# Patient Record
Sex: Female | Born: 1960 | Race: White | Hispanic: No | Smoking: Never smoker
Health system: Southern US, Community
[De-identification: ages and names within clinical notes are randomized; demographics above are authoritative.]

## PROBLEM LIST (undated history)

## (undated) DIAGNOSIS — M51369 Other intervertebral disc degeneration, lumbar region without mention of lumbar back pain or lower extremity pain: Secondary | ICD-10-CM

## (undated) DIAGNOSIS — A419 Sepsis, unspecified organism: Secondary | ICD-10-CM

## (undated) DIAGNOSIS — R109 Unspecified abdominal pain: Secondary | ICD-10-CM

## (undated) DIAGNOSIS — J449 Chronic obstructive pulmonary disease, unspecified: Secondary | ICD-10-CM

## (undated) DIAGNOSIS — K219 Gastro-esophageal reflux disease without esophagitis: Secondary | ICD-10-CM

## (undated) DIAGNOSIS — E119 Type 2 diabetes mellitus without complications: Secondary | ICD-10-CM

## (undated) DIAGNOSIS — K5792 Diverticulitis of intestine, part unspecified, without perforation or abscess without bleeding: Secondary | ICD-10-CM

## (undated) DIAGNOSIS — K859 Acute pancreatitis without necrosis or infection, unspecified: Secondary | ICD-10-CM

## (undated) DIAGNOSIS — M1990 Unspecified osteoarthritis, unspecified site: Secondary | ICD-10-CM

## (undated) DIAGNOSIS — J189 Pneumonia, unspecified organism: Secondary | ICD-10-CM

## (undated) DIAGNOSIS — K5790 Diverticulosis of intestine, part unspecified, without perforation or abscess without bleeding: Secondary | ICD-10-CM

## (undated) DIAGNOSIS — M5136 Other intervertebral disc degeneration, lumbar region: Secondary | ICD-10-CM

## (undated) DIAGNOSIS — D869 Sarcoidosis, unspecified: Secondary | ICD-10-CM

## (undated) DIAGNOSIS — M797 Fibromyalgia: Secondary | ICD-10-CM

## (undated) DIAGNOSIS — I341 Nonrheumatic mitral (valve) prolapse: Secondary | ICD-10-CM

## (undated) HISTORY — DX: Other intervertebral disc degeneration, lumbar region: M51.36

## (undated) HISTORY — DX: Sarcoidosis, unspecified: D86.9

## (undated) HISTORY — DX: Other intervertebral disc degeneration, lumbar region without mention of lumbar back pain or lower extremity pain: M51.369

## (undated) HISTORY — PX: CHOLECYSTECTOMY: SHX55

## (undated) HISTORY — PX: ABDOMINAL HYSTERECTOMY: SHX81

---

## 1995-09-16 DIAGNOSIS — K859 Acute pancreatitis without necrosis or infection, unspecified: Secondary | ICD-10-CM

## 1995-09-16 HISTORY — DX: Acute pancreatitis without necrosis or infection, unspecified: K85.90

## 1998-06-10 ENCOUNTER — Emergency Department (HOSPITAL_COMMUNITY): Admission: EM | Admit: 1998-06-10 | Discharge: 1998-06-11 | Payer: Self-pay | Admitting: Emergency Medicine

## 1998-06-11 ENCOUNTER — Encounter: Payer: Self-pay | Admitting: Emergency Medicine

## 2000-05-12 ENCOUNTER — Encounter: Admission: RE | Admit: 2000-05-12 | Discharge: 2000-05-12 | Payer: Self-pay | Admitting: Internal Medicine

## 2000-05-21 ENCOUNTER — Encounter: Admission: RE | Admit: 2000-05-21 | Discharge: 2000-05-21 | Payer: Self-pay | Admitting: Internal Medicine

## 2000-08-16 ENCOUNTER — Emergency Department (HOSPITAL_COMMUNITY): Admission: EM | Admit: 2000-08-16 | Discharge: 2000-08-16 | Payer: Self-pay | Admitting: Emergency Medicine

## 2000-08-27 ENCOUNTER — Ambulatory Visit (HOSPITAL_COMMUNITY): Admission: RE | Admit: 2000-08-27 | Discharge: 2000-08-27 | Payer: Self-pay | Admitting: Gastroenterology

## 2000-08-27 ENCOUNTER — Encounter: Payer: Self-pay | Admitting: Gastroenterology

## 2000-08-31 ENCOUNTER — Ambulatory Visit (HOSPITAL_COMMUNITY): Admission: RE | Admit: 2000-08-31 | Discharge: 2000-08-31 | Payer: Self-pay | Admitting: Gastroenterology

## 2000-08-31 ENCOUNTER — Encounter: Payer: Self-pay | Admitting: Gastroenterology

## 2000-09-22 ENCOUNTER — Ambulatory Visit (HOSPITAL_COMMUNITY): Admission: RE | Admit: 2000-09-22 | Discharge: 2000-09-22 | Payer: Self-pay | Admitting: Gastroenterology

## 2000-09-22 ENCOUNTER — Encounter: Payer: Self-pay | Admitting: Gastroenterology

## 2000-09-28 ENCOUNTER — Ambulatory Visit (HOSPITAL_COMMUNITY): Admission: RE | Admit: 2000-09-28 | Discharge: 2000-09-28 | Payer: Self-pay | Admitting: Gastroenterology

## 2000-09-28 ENCOUNTER — Encounter: Payer: Self-pay | Admitting: Gastroenterology

## 2009-09-19 DIAGNOSIS — K5792 Diverticulitis of intestine, part unspecified, without perforation or abscess without bleeding: Secondary | ICD-10-CM

## 2011-12-11 DIAGNOSIS — D862 Sarcoidosis of lung with sarcoidosis of lymph nodes: Secondary | ICD-10-CM | POA: Insufficient documentation

## 2011-12-11 DIAGNOSIS — R911 Solitary pulmonary nodule: Secondary | ICD-10-CM | POA: Insufficient documentation

## 2013-12-14 ENCOUNTER — Encounter: Payer: Self-pay | Admitting: Pulmonary Disease

## 2013-12-15 ENCOUNTER — Institutional Professional Consult (permissible substitution): Payer: Self-pay | Admitting: Critical Care Medicine

## 2013-12-15 ENCOUNTER — Institutional Professional Consult (permissible substitution): Payer: Medicare Other | Admitting: Critical Care Medicine

## 2014-02-09 ENCOUNTER — Institutional Professional Consult (permissible substitution): Payer: Self-pay | Admitting: Critical Care Medicine

## 2014-03-05 ENCOUNTER — Emergency Department (HOSPITAL_BASED_OUTPATIENT_CLINIC_OR_DEPARTMENT_OTHER): Payer: Medicare Other

## 2014-03-05 ENCOUNTER — Encounter (HOSPITAL_BASED_OUTPATIENT_CLINIC_OR_DEPARTMENT_OTHER): Payer: Self-pay | Admitting: Emergency Medicine

## 2014-03-05 ENCOUNTER — Inpatient Hospital Stay (HOSPITAL_BASED_OUTPATIENT_CLINIC_OR_DEPARTMENT_OTHER)
Admission: EM | Admit: 2014-03-05 | Discharge: 2014-03-08 | DRG: 871 | Disposition: A | Discharge status: Home / Self Care | Payer: Medicare Other | Attending: Internal Medicine | Admitting: Internal Medicine

## 2014-03-05 DIAGNOSIS — Z886 Allergy status to analgesic agent status: Secondary | ICD-10-CM

## 2014-03-05 DIAGNOSIS — A419 Sepsis, unspecified organism: Secondary | ICD-10-CM | POA: Diagnosis present

## 2014-03-05 DIAGNOSIS — J42 Unspecified chronic bronchitis: Secondary | ICD-10-CM

## 2014-03-05 DIAGNOSIS — K5792 Diverticulitis of intestine, part unspecified, without perforation or abscess without bleeding: Secondary | ICD-10-CM

## 2014-03-05 DIAGNOSIS — I498 Other specified cardiac arrhythmias: Secondary | ICD-10-CM | POA: Diagnosis present

## 2014-03-05 DIAGNOSIS — Z882 Allergy status to sulfonamides status: Secondary | ICD-10-CM

## 2014-03-05 DIAGNOSIS — R5382 Chronic fatigue, unspecified: Secondary | ICD-10-CM | POA: Diagnosis present

## 2014-03-05 DIAGNOSIS — J449 Chronic obstructive pulmonary disease, unspecified: Secondary | ICD-10-CM | POA: Diagnosis present

## 2014-03-05 DIAGNOSIS — D869 Sarcoidosis, unspecified: Secondary | ICD-10-CM | POA: Diagnosis present

## 2014-03-05 DIAGNOSIS — J438 Other emphysema: Secondary | ICD-10-CM

## 2014-03-05 DIAGNOSIS — G9332 Myalgic encephalomyelitis/chronic fatigue syndrome: Secondary | ICD-10-CM | POA: Diagnosis present

## 2014-03-05 DIAGNOSIS — J180 Bronchopneumonia, unspecified organism: Secondary | ICD-10-CM | POA: Diagnosis present

## 2014-03-05 DIAGNOSIS — J4489 Other specified chronic obstructive pulmonary disease: Secondary | ICD-10-CM | POA: Diagnosis present

## 2014-03-05 DIAGNOSIS — Z881 Allergy status to other antibiotic agents status: Secondary | ICD-10-CM

## 2014-03-05 DIAGNOSIS — I059 Rheumatic mitral valve disease, unspecified: Secondary | ICD-10-CM | POA: Diagnosis present

## 2014-03-05 DIAGNOSIS — IMO0001 Reserved for inherently not codable concepts without codable children: Secondary | ICD-10-CM | POA: Diagnosis present

## 2014-03-05 DIAGNOSIS — J189 Pneumonia, unspecified organism: Secondary | ICD-10-CM

## 2014-03-05 HISTORY — DX: Gastro-esophageal reflux disease without esophagitis: K21.9

## 2014-03-05 HISTORY — DX: Nonrheumatic mitral (valve) prolapse: I34.1

## 2014-03-05 HISTORY — DX: Unspecified osteoarthritis, unspecified site: M19.90

## 2014-03-05 HISTORY — DX: Chronic obstructive pulmonary disease, unspecified: J44.9

## 2014-03-05 HISTORY — DX: Acute pancreatitis without necrosis or infection, unspecified: K85.90

## 2014-03-05 HISTORY — DX: Diverticulosis of intestine, part unspecified, without perforation or abscess without bleeding: K57.90

## 2014-03-05 LAB — COMPREHENSIVE METABOLIC PANEL
ALK PHOS: 124 U/L — AB (ref 39–117)
ALT: 35 U/L (ref 0–35)
AST: 30 U/L (ref 0–37)
Albumin: 3.9 g/dL (ref 3.5–5.2)
BUN: 7 mg/dL (ref 6–23)
CO2: 26 mEq/L (ref 19–32)
Calcium: 9.6 mg/dL (ref 8.4–10.5)
Chloride: 103 mEq/L (ref 96–112)
Creatinine, Ser: 0.7 mg/dL (ref 0.50–1.10)
GFR calc non Af Amer: >90 mL/min (ref >90)
GLUCOSE: 134 mg/dL — AB (ref 70–99)
POTASSIUM: 3.7 meq/L (ref 3.7–5.3)
Sodium: 141 mEq/L (ref 137–147)
TOTAL PROTEIN: 7 g/dL (ref 6.0–8.3)
Total Bilirubin: 0.4 mg/dL (ref 0.3–1.2)

## 2014-03-05 LAB — URINE MICROSCOPIC-ADD ON

## 2014-03-05 LAB — URINALYSIS, ROUTINE W REFLEX MICROSCOPIC
Bilirubin Urine: NEGATIVE
Glucose, UA: NEGATIVE mg/dL
Hgb urine dipstick: NEGATIVE
Ketones, ur: NEGATIVE mg/dL
NITRITE: NEGATIVE
Protein, ur: NEGATIVE mg/dL
Specific Gravity, Urine: 1.009 (ref 1.005–1.030)
Urobilinogen, UA: 1 mg/dL (ref 0.0–1.0)
pH: 6 (ref 5.0–8.0)

## 2014-03-05 LAB — MRSA PCR SCREENING: MRSA BY PCR: NEGATIVE

## 2014-03-05 LAB — CBC WITH DIFFERENTIAL/PLATELET
Basophils Absolute: 0 10*3/uL (ref 0.0–0.1)
Basophils Relative: 0 % (ref 0–1)
Eosinophils Absolute: 0.1 10*3/uL (ref 0.0–0.7)
Eosinophils Relative: 1 % (ref 0–5)
HCT: 36.4 % (ref 36.0–46.0)
Hemoglobin: 12.3 g/dL (ref 12.0–15.0)
LYMPHS ABS: 1 10*3/uL (ref 0.7–4.0)
Lymphocytes Relative: 10 % — ABNORMAL LOW (ref 12–46)
MCH: 31.1 pg (ref 26.0–34.0)
MCHC: 33.8 g/dL (ref 30.0–36.0)
MCV: 91.9 fL (ref 78.0–100.0)
MONO ABS: 0.5 10*3/uL (ref 0.1–1.0)
Monocytes Relative: 5 % (ref 3–12)
Neutro Abs: 8.7 10*3/uL — ABNORMAL HIGH (ref 1.7–7.7)
Neutrophils Relative %: 85 % — ABNORMAL HIGH (ref 43–77)
Platelets: 195 10*3/uL (ref 150–400)
RBC: 3.96 MIL/uL (ref 3.87–5.11)
RDW: 11.8 % (ref 11.5–15.5)
WBC: 10.3 10*3/uL (ref 4.0–10.5)

## 2014-03-05 LAB — TROPONIN I: Troponin I: <0.3 ng/mL (ref <0.30)

## 2014-03-05 LAB — RAPID STREP SCREEN (MED CTR MEBANE ONLY): STREPTOCOCCUS, GROUP A SCREEN (DIRECT): NEGATIVE

## 2014-03-05 LAB — I-STAT CG4 LACTIC ACID, ED: LACTIC ACID, VENOUS: 1.76 mmol/L (ref 0.5–2.2)

## 2014-03-05 MED ORDER — HYDROCORTISONE NA SUCCINATE PF 100 MG IJ SOLR
100.0000 mg | Freq: Once | INTRAMUSCULAR | Status: AC
  Administered 2014-03-05: 100 mg via INTRAVENOUS
  Filled 2014-03-05: qty 2

## 2014-03-05 MED ORDER — SODIUM CHLORIDE 0.9 % IV SOLN
INTRAVENOUS | Status: DC
  Administered 2014-03-05 – 2014-03-06 (×3): via INTRAVENOUS

## 2014-03-05 MED ORDER — SODIUM CHLORIDE 0.9 % IV SOLN: Freq: Once | INTRAVENOUS | Status: AC

## 2014-03-05 MED ORDER — METOCLOPRAMIDE HCL 5 MG/ML IJ SOLN
INTRAMUSCULAR | Status: AC
  Administered 2014-03-05: 10 mg
  Filled 2014-03-05: qty 2

## 2014-03-05 MED ORDER — CYCLOBENZAPRINE HCL 10 MG PO TABS
5.0000 mg | ORAL_TABLET | Freq: Three times a day (TID) | ORAL | Status: DC | PRN
  Administered 2014-03-05 – 2014-03-08 (×4): 5 mg via ORAL
  Filled 2014-03-05 (×5): qty 1

## 2014-03-05 MED ORDER — DEXTROSE 5 % IV SOLN
1.0000 g | Freq: Once | INTRAVENOUS | Status: AC
  Administered 2014-03-05: 1 g via INTRAVENOUS

## 2014-03-05 MED ORDER — CLINDAMYCIN PHOSPHATE 600 MG/50ML IV SOLN
600.0000 mg | Freq: Once | INTRAVENOUS | Status: AC
  Administered 2014-03-05: 600 mg via INTRAVENOUS
  Filled 2014-03-05: qty 50

## 2014-03-05 MED ORDER — SODIUM CHLORIDE 0.9 % IV BOLUS (SEPSIS)
1000.0000 mL | Freq: Once | INTRAVENOUS | Status: AC
  Administered 2014-03-05: 1000 mL via INTRAVENOUS

## 2014-03-05 MED ORDER — IOHEXOL 300 MG/ML  SOLN
100.0000 mL | Freq: Once | INTRAMUSCULAR | Status: AC | PRN
  Administered 2014-03-05: 100 mL via INTRAVENOUS

## 2014-03-05 MED ORDER — HYDROCODONE-ACETAMINOPHEN 5-325 MG PO TABS
1.0000 | ORAL_TABLET | Freq: Four times a day (QID) | ORAL | Status: DC | PRN
  Administered 2014-03-05 – 2014-03-06 (×3): 1 via ORAL
  Filled 2014-03-05 (×3): qty 1

## 2014-03-05 MED ORDER — PANTOPRAZOLE SODIUM 40 MG PO TBEC
40.0000 mg | DELAYED_RELEASE_TABLET | Freq: Every day | ORAL | Status: DC
  Administered 2014-03-06 – 2014-03-08 (×3): 40 mg via ORAL
  Filled 2014-03-05 (×3): qty 1

## 2014-03-05 MED ORDER — ONDANSETRON HCL 4 MG PO TABS: 4.0000 mg | ORAL_TABLET | Freq: Three times a day (TID) | ORAL | Status: DC | PRN

## 2014-03-05 MED ORDER — ENOXAPARIN SODIUM 40 MG/0.4ML ~~LOC~~ SOLN
40.0000 mg | SUBCUTANEOUS | Status: DC
  Administered 2014-03-05 – 2014-03-07 (×3): 40 mg via SUBCUTANEOUS
  Filled 2014-03-05 (×4): qty 0.4

## 2014-03-05 MED ORDER — ONDANSETRON HCL 4 MG PO TABS: 4.0000 mg | ORAL_TABLET | Freq: Four times a day (QID) | ORAL | Status: DC | PRN

## 2014-03-05 MED ORDER — ACETAMINOPHEN 325 MG PO TABS: 650.0000 mg | ORAL_TABLET | Freq: Four times a day (QID) | ORAL | Status: DC | PRN

## 2014-03-05 MED ORDER — ONDANSETRON HCL 4 MG/2ML IJ SOLN
4.0000 mg | Freq: Four times a day (QID) | INTRAMUSCULAR | Status: DC | PRN
  Administered 2014-03-05 – 2014-03-07 (×3): 4 mg via INTRAVENOUS
  Filled 2014-03-05 (×2): qty 2

## 2014-03-05 MED ORDER — DEXTROSE 5 % IV SOLN
500.0000 mg | Freq: Once | INTRAVENOUS | Status: AC
  Administered 2014-03-05: 500 mg via INTRAVENOUS
  Filled 2014-03-05: qty 500

## 2014-03-05 MED ORDER — ONDANSETRON HCL 4 MG/2ML IJ SOLN
INTRAMUSCULAR | Status: AC
  Filled 2014-03-05: qty 2

## 2014-03-05 MED ORDER — TRIAMCINOLONE ACETONIDE 55 MCG/ACT NA AERO
2.0000 | INHALATION_SPRAY | Freq: Every day | NASAL | Status: DC
  Administered 2014-03-06: 2 via NASAL
  Filled 2014-03-05: qty 21.6

## 2014-03-05 MED ORDER — ACETAMINOPHEN 650 MG RE SUPP: 650.0000 mg | Freq: Four times a day (QID) | RECTAL | Status: DC | PRN

## 2014-03-05 MED ORDER — PROMETHAZINE HCL 25 MG/ML IJ SOLN
12.5000 mg | Freq: Once | INTRAMUSCULAR | Status: AC
  Administered 2014-03-05: 12.5 mg via INTRAVENOUS
  Filled 2014-03-05: qty 1

## 2014-03-05 MED ORDER — SODIUM CHLORIDE 0.9 % IV SOLN
INTRAVENOUS | Status: AC
  Administered 2014-03-05: 19:00:00 via INTRAVENOUS

## 2014-03-05 MED ORDER — NOREPINEPHRINE BITARTRATE 1 MG/ML IV SOLN
2.0000 ug/min | Freq: Once | INTRAVENOUS | Status: DC
  Filled 2014-03-05: qty 4

## 2014-03-05 MED ORDER — LEVALBUTEROL HCL 1.25 MG/3ML IN NEBU
1.2500 mg | INHALATION_SOLUTION | RESPIRATORY_TRACT | Status: DC | PRN
  Administered 2014-03-06: 1.25 mg via RESPIRATORY_TRACT
  Filled 2014-03-05 (×2): qty 3

## 2014-03-05 MED ORDER — CEFTRIAXONE SODIUM 1 G IJ SOLR
INTRAMUSCULAR | Status: AC
  Filled 2014-03-05: qty 10

## 2014-03-05 MED ORDER — CLINDAMYCIN PHOSPHATE 600 MG/50ML IV SOLN
600.0000 mg | Freq: Three times a day (TID) | INTRAVENOUS | Status: DC
  Administered 2014-03-05 – 2014-03-08 (×9): 600 mg via INTRAVENOUS
  Filled 2014-03-05 (×14): qty 50

## 2014-03-05 MED ORDER — AZTREONAM 1 G IJ SOLR
1.0000 g | Freq: Three times a day (TID) | INTRAMUSCULAR | Status: DC
  Administered 2014-03-05 – 2014-03-08 (×9): 1 g via INTRAVENOUS
  Filled 2014-03-05 (×11): qty 1

## 2014-03-05 MED ORDER — HYDROCORTISONE NA SUCCINATE PF 100 MG IJ SOLR
100.0000 mg | Freq: Four times a day (QID) | INTRAMUSCULAR | Status: DC
  Administered 2014-03-05 – 2014-03-06 (×3): 100 mg via INTRAVENOUS
  Filled 2014-03-05 (×6): qty 2

## 2014-03-05 MED ORDER — ALPRAZOLAM 0.5 MG PO TABS
2.0000 mg | ORAL_TABLET | Freq: Every evening | ORAL | Status: DC | PRN
  Administered 2014-03-05 – 2014-03-07 (×2): 2 mg via ORAL
  Filled 2014-03-05 (×2): qty 4

## 2014-03-05 MED ORDER — ALBUTEROL SULFATE HFA 108 (90 BASE) MCG/ACT IN AERS: 2.0000 | INHALATION_SPRAY | Freq: Four times a day (QID) | RESPIRATORY_TRACT | Status: DC | PRN

## 2014-03-05 MED ORDER — ACETAMINOPHEN 325 MG PO TABS
650.0000 mg | ORAL_TABLET | Freq: Once | ORAL | Status: AC
  Administered 2014-03-05: 650 mg via ORAL
  Filled 2014-03-05: qty 2

## 2014-03-05 NOTE — ED Notes (Signed)
Pt verbalizing upset over not receiving pain meds, informed that her current mental status is slightly drowsy, not opening eyes unless prompted to do so and slurred speech intermittently.  Pt states that this is just "because I'm really sick".  Informed she will have to wait for pain meds until she is more alert.

## 2014-03-05 NOTE — ED Notes (Signed)
I ambulated the patient with the pulse oximetry. Her O2 stats were 93 to 94 and her pulse went as high as 136, while we were walking. She stated that she would get dizzy when we were walking. All of the vital signs were reported to Dr. Manus Gunningancour.

## 2014-03-05 NOTE — ED Notes (Signed)
MD at bedside. 

## 2014-03-05 NOTE — ED Notes (Signed)
Patient transported to X-ray via stretcher per tech. 

## 2014-03-05 NOTE — ED Notes (Signed)
Patient transported to CT via stretcher per tech.  MD notified of bp/hr.

## 2014-03-05 NOTE — ED Provider Notes (Signed)
CSN: [id number]     Arrival date & time 03/05/14  1455 History  This chart was scribed for Holly OctaveStephen Rancour, MD by Phillis HaggisGabriella Gaje, ED Scribe. This patient was seen in room MH12/MH12 and patient care was started at 3:08 PM.   Chief Complaint  Patient presents with  . Fever    The history is provided by the patient. No language interpreter was used.    HPI Comments: Holly Rogers is a 53 y.o. female with h/o asthma, COPD, fibromyalgia, chronic fatigue syndrome and mitral valve prolapse who presents to the Emergency Department complaining of persistent, worsening SOB that began 1 week ago.  Pt states she has had "bronchitis" for the past week and characterizes her symptoms as SOB with associated non-productive cough, generalized weakness and lightheadedness.  Symptoms have been worsening and for the past few days she has also developed chest pain.  Chest pain is constant and is not worsened by anything.  She also reports associated fever and chills.  ED temperature is 102 F.  In addition she complains of headache, back pain and hip pain.  She has been nauseated but denies vomiting.  She also states she has noticed "blisters" in her throat.  She has been taking Vicodin for her pain but denies taking too much of her pain medication.  Pt notes her son has been coughing recently but denies any other recent sick contact.  She denies recent falls.  Pt denies h/o MI or stent placement.  She reports she has been constipated recently since she stopped taking probiotics which she typically takes regularly.  PCP is Dr. Jessee [facility]versobert Toborg [facility]    Past Medical History  Diagnosis Date  . Sarcoidosis   . COPD (chronic obstructive pulmonary disease)   . Arthritis   . Acid reflux   . Mitral valve prolapse   . Diverticulosis   . Pancreatitis 1997    Past Surgical History  Procedure Laterality Date  . Cholecystectomy      History reviewed. No pertinent family history.   History  Substance Use Topics  .  Smoking status: Never Smoker   . Smokeless tobacco: Never Used  . Alcohol Use: No    OB History   Grav Para Term Preterm Abortions TAB SAB Ect Mult Living                   Review of Systems A complete 10 system review of systems was obtained and all systems are negative except as noted in the HPI and PMH.     Allergies  Amoxicillin; Avandia; Baclofen; Ceftin; Celebrex; Ciprofloxacin; Doxycycline; Flagyl; Levaquin; Macrobid; Neurontin; Sulfa antibiotics; Ultram; Vancomycin; and Voltaren  Home Medications   Prior to Admission medications   Medication Sig Start Date End Date Taking? Authorizing Provider  albuterol (PROAIR HFA) 108 (90 BASE) MCG/ACT inhaler Inhale 2 puffs into the lungs every 6 (six) hours as needed for wheezing or shortness of breath.    Historical Provider, MD  alprazolam Prudy Feeler(XANAX) 2 MG tablet Take 2 mg by mouth at bedtime as needed for sleep.    Historical Provider, MD  budesonide-formoterol (SYMBICORT) 160-4.5 MCG/ACT inhaler Inhale 2 puffs into the lungs 2 (two) times daily.    Historical Provider, MD  calcium carbonate 200 MG capsule Take 200 mg by mouth daily.    Historical Provider, MD  cyclobenzaprine (FLEXERIL) 5 MG tablet Take 5 mg by mouth 3 (three) times daily as needed for muscle spasms.    Historical Provider, MD  fexofenadine (ALLEGRA) 180 MG tablet Take 180 mg by mouth daily.    Historical Provider, MD  HYDROcodone-acetaminophen (NORCO/VICODIN) 5-325 MG per tablet Take 1 tablet by mouth every 6 (six) hours as needed for moderate pain.    Historical Provider, MD  hyoscyamine (LEVSIN) 0.125 MG/ML solution Take 0.125 mg by mouth every 4 (four) hours as needed.    Historical Provider, MD  levalbuterol Pauline Aus) 1.25 MG/3ML nebulizer solution Take 1.25 mg by nebulization every 4 (four) hours as needed for wheezing.    Historical Provider, MD  omeprazole (PRILOSEC) 20 MG capsule Take 20 mg by mouth daily.    Historical Provider, MD  ondansetron (ZOFRAN) 4 MG  tablet Take 4 mg by mouth every 8 (eight) hours as needed for nausea or vomiting.    Historical Provider, MD  triamcinolone (NASACORT ALLERGY 24HR) 55 MCG/ACT AERO nasal inhaler Place 2 sprays into the nose daily.    Historical Provider, MD  Vitamin D, Ergocalciferol, (DRISDOL) 50000 UNITS CAPS capsule Take 50,000 Units by mouth every 7 (seven) days.    Historical Provider, MD   BP 110/62  Pulse 127  Temp(Src) 102 F (38.9 C) (Oral)  Resp 24  Ht 5\' 3"  (1.6 m)  Wt 165 lb (74.844 kg)  BMI 29.24 kg/m2  SpO2 93%  Physical Exam  Nursing note and vitals reviewed. Constitutional: She is oriented to person, place, and time. She appears well-developed and well-nourished. She appears distressed (mild).  HENT:  Head: Normocephalic and atraumatic.  Mouth/Throat: Oropharynx is clear and moist. No oropharyngeal exudate.  Speaking in short phrases Blue discoloration to oropharynx.  No asymmetry or exudate.  Eyes: Conjunctivae and EOM are normal. Pupils are equal, round, and reactive to light.  Neck: Normal range of motion. Neck supple.  No meningismus.  Cardiovascular: Regular rhythm, normal heart sounds and intact distal pulses.  Tachycardia present.   No murmur heard. Pulmonary/Chest: Tachypnea noted. No respiratory distress. She has no wheezes. She has rhonchi (scattered). She has no rales. She exhibits tenderness (reproducible sternal tenderness).  Abdominal: Soft. There is no tenderness. There is no rebound and no guarding.  Musculoskeletal: Normal range of motion. She exhibits no edema and no tenderness.  No leg swelling  Neurological: She is alert and oriented to person, place, and time. No cranial nerve deficit. She exhibits normal muscle tone. Coordination normal.  No ataxia on finger to nose bilaterally. No pronator drift. 5/5 strength throughout. CN 2-12 intact. Negative Romberg. Equal grip strength. Sensation intact. Gait is normal.   Skin: Skin is warm.  Psychiatric: She has a normal  mood and affect. Her behavior is normal.    ED Course  Procedures (including critical care time)  DIAGNOSTIC STUDIES: Oxygen Saturation is 93% on room air, adequate by my interpretation.    COORDINATION OF CARE: 3:17 PM-Discussed treatment plan which includes IV fluids, CXR, and labs with pt at bedside and pt agreed to plan.     Labs Review Labs Reviewed  CBC WITH DIFFERENTIAL - Abnormal; Notable for the following:    Neutrophils Relative % 85 (*)    Neutro Abs 8.7 (*)    Lymphocytes Relative 10 (*)    All other components within normal limits  COMPREHENSIVE METABOLIC PANEL - Abnormal; Notable for the following:    Glucose, Bld 134 (*)    Alkaline Phosphatase 124 (*)    All other components within normal limits  URINALYSIS, ROUTINE W REFLEX MICROSCOPIC - Abnormal; Notable for the following:    Leukocytes, UA  SMALL (*)    All other components within normal limits  URINE MICROSCOPIC-ADD ON - Abnormal; Notable for the following:    Bacteria, UA FEW (*)    All other components within normal limits  RAPID STREP SCREEN  MRSA PCR SCREENING  CULTURE, BLOOD (ROUTINE X 2)  CULTURE, BLOOD (ROUTINE X 2)  URINE CULTURE  CULTURE, GROUP A STREP  TROPONIN I  TROPONIN I  LACTIC ACID, PLASMA  HIV ANTIBODY (ROUTINE TESTING)  TROPONIN I  TROPONIN I  STREP PNEUMONIAE URINARY ANTIGEN  LEGIONELLA ANTIGEN, URINE  CORTISOL  LACTIC ACID, PLASMA  I-STAT CG4 LACTIC ACID, ED    Imaging Review Dg Chest 2 View  03/05/2014   CLINICAL DATA:  Fever, chills and shortness of breath.  EXAM: CHEST  2 VIEW  COMPARISON:  Chest x-ray 08/09/2013.  FINDINGS: Patchy areas of bronchial wall thickening and ill-defined opacities, most evident in the left lower lobe, concerning for bronchitis with potential left lower lobe bronchopneumonia. No pleural effusions. No evidence of pulmonary edema. Heart size is normal. Small hiatal hernia. Mediastinal contours are otherwise unremarkable.  IMPRESSION: 1. The  appearance the chest is compatible with bronchitis and possible early left lower lobe bronchopneumonia. Repeat radiographs in 2-3 weeks after appropriate trial of antimicrobial therapy are recommended to ensure resolution of these findings. 2. Small hiatal hernia.   Electronically Signed   By: Trudie Reed M.D.   On: 03/05/2014 16:10   Ct Head Wo Contrast  03/05/2014   CLINICAL DATA:  53 year old female with slurred speech and altered mental status.  EXAM: CT HEAD WITHOUT CONTRAST  TECHNIQUE: Contiguous axial images were obtained from the base of the skull through the vertex without intravenous contrast.  COMPARISON:  08/10/2013 CT  FINDINGS: No intracranial abnormalities are identified, including mass lesion or mass effect, hydrocephalus, extra-axial fluid collection, midline shift, hemorrhage, or acute infarction.  The visualized bony calvarium is unremarkable.  IMPRESSION: Unremarkable noncontrast head CT.   Electronically Signed   By: Laveda Abbe M.D.   On: 03/05/2014 17:52   Ct Angio Chest Pe W/cm &/or Wo Cm  03/05/2014   CLINICAL DATA:  Weakness and shortness of breath for the past 4 days.  EXAM: CT ANGIOGRAPHY CHEST WITH CONTRAST  TECHNIQUE: Multidetector CT imaging of the chest was performed using the standard protocol during bolus administration of intravenous contrast. Multiplanar CT image reconstructions and MIPs were obtained to evaluate the vascular anatomy.  CONTRAST:  100 mL of Omnipaque 350.  COMPARISON:  Chest CT 08/10/2013.  FINDINGS: Mediastinum: There are no filling defects within the pulmonary arterial tree to suggest underlying pulmonary embolism. Heart size is normal. There is no significant pericardial fluid, thickening or pericardial calcification. No pathologically enlarged mediastinal are hilar lymph nodes. Prominent azygos vein incidentally noted (unchanged compared to the prior examination). Moderate size hiatal hernia.  Lungs/Pleura: There are new patchy areas of  peribronchovascular airspace consolidation in the lungs bilaterally, most significantly in the posterior aspect of the left lower lobe, with an additional focus in the posterior lateral aspect of the right upper lobe. Findings suggest a multilobar bronchopneumonia. Linear areas of scarring are also noted in the lower lobes of the lungs bilaterally. No pleural effusions.  Upper Abdomen: Small amount of pneumobilia (similar to the prior study), presumably related to prior sphincterotomy. Low attenuation throughout the hepatic parenchyma, compatible with hepatic steatosis.  Musculoskeletal: There are no aggressive appearing lytic or blastic lesions noted in the visualized portions of the skeleton.  Review of the  MIP images confirms the above findings.  IMPRESSION: 1. No evidence of pulmonary embolism. 2. However, there is multilobar bronchopneumonia in the left lower and right upper lobes, as above. Followup PA and lateral chest radiographs are recommended in 2-3 weeks after appropriate trial of antimicrobial therapy to ensure complete resolution of these findings. 3. Hepatic steatosis.   Electronically Signed   By: Trudie Reedaniel  Entrikin M.D.   On: 03/05/2014 17:56     EKG Interpretation   Date/Time:  Sunday March 05 2014 15:04:54 EDT Ventricular Rate:  127 PR Interval:  186 QRS Duration: 82 QT Interval:  266 QTC Calculation: 386 R Axis:   96 Text Interpretation:  Sinus tachycardia Rightward axis Borderline ECG No  previous ECGs available Confirmed by Manus GunningANCOUR  MD, STEPHEN [phone]([id number]) on  03/05/2014 3:21:54 PM      MDM   Final diagnoses:  Sepsis, due to unspecified organism  CAP (community acquired pneumonia)   1 week history of generalized body aches, congestion, fever, dry cough, shortness of breath and chest pain. Chest pain is constant does not radiate. It is reproducible to palpation. On arrival patient is febrile, tachycardic and hypoxic. EKG with sinus tachycardia at 127  Chest x-ray shows  possibly early left lower lobe pneumonia. With hypoxia, fever, tachycardia we'll treat as pneumonia. Patient has multiple antibiotic intolerances but no true allergies. She says they cause nausea and C. Difficile. Rocephin and azithromycin given.  Blood and urine cultures sent. CT obtained for persistent tachycardia. No evidence of PE. However there is multilevel bronchopneumonia bilaterally. Will broaden antibiotic coverage.  She has multiple intolerances.  Aztreonam and clindamycin given.  Patient remains hypotensive in the ED. She's had 4 L of IV fluid and blood pressure remains 90/50 systolic with maps of 60. Admission discussed with Dr. Jerral RalphGhimire  tentatively accepts patient pending improvement of blood pressure.   Blood pressure remains 80s and 90s systolic, HR 110s.Marland Kitchen. MAP of 60. Will start Levophed. Discussed with critical care.  Blood pressure now 107 systolic. Will hold of levophed at this time. Heart rate 115. Discussed with Dr. Katrinka BlazingSmith of critical care who agrees with hospitalist admission to step down. Critical care available to consult as needed.  CRITICAL CARE Performed by: Holly OctaveANCOUR, STEPHEN Total critical care time: 60 Critical care time was exclusive of separately billable procedures and treating other patients. Critical care was necessary to treat or prevent imminent or life-threatening deterioration. Critical care was time spent personally by me on the following activities: development of treatment plan with patient and/or surrogate as well as nursing, discussions with consultants, evaluation of patient's response to treatment, examination of patient, obtaining history from patient or surrogate, ordering and performing treatments and interventions, ordering and review of laboratory studies, ordering and review of radiographic studies, pulse oximetry and re-evaluation of patient's condition.   I personally performed the services described in this documentation, which was scribed in my  presence. The recorded information has been reviewed and is accurate.    Holly OctaveStephen Rancour, MD 03/06/14 [phone]

## 2014-03-05 NOTE — H&P (Signed)
[facility] History and Physical  Holly Rogers Ueda WUJ:[id number] DOB: [birth year] DOA: 03/05/2014  Referring physician: ED physician.  PCP: Langley GaussBORG,ROBERT TED, MD   Chief Complaint: Cough, chest pain.   HPI: Holly Rogers is a 53 y.o. female with PMH significant for sarcoidosis, Mitral valve prolapse,  COPD who presents complaining of chest pain, pleuritic  in nature, intermittent that started  few days ago. Patient also relates cough, dry at times, chills, SOB that started 3 to 4 days prior to admission. She also relates headaches, joints points, she has notice swelling in her ankle. She denies sick contact. She relates sore throat.  She vomited after she received antibiotics in the ED.   In the ED she was found to have tempeture at 102, CT angio showed multilobar PNA. She was hypotensive SBP in 80 and 90. After 4 L of fluids her SBP has remain in the 110 range. She didn't have to received levophed.    Review of Systems:  Negative, except as per HPI.   Past Medical History  Diagnosis Date  . Sarcoidosis   . COPD (chronic obstructive pulmonary disease)   . Arthritis   . Acid reflux   . Mitral valve prolapse   . Diverticulosis    History reviewed. No pertinent past surgical history. Social History:  reports that she has never smoked. She has never used smokeless tobacco. Her alcohol and drug histories are not on file.  Allergies  Allergen Reactions  . Amoxicillin Nausea Only  . Avandia [Rosiglitazone]   . Baclofen   . Ceftin [Cefuroxime Axetil]   . Celebrex [Celecoxib]   . Ciprofloxacin   . Doxycycline   . Flagyl [Metronidazole] Nausea And Vomiting  . Levaquin [Levofloxacin In D5w]   . Macrobid [employer][Nitrofurantoin Monohyd Macro]   . Neurontin [Gabapentin]   . Sulfa Antibiotics   . Ultram [Tramadol]   . Vancomycin Nausea And Vomiting  . Voltaren [Diclofenac Sodium]    Family history; Mother with H/O COPD, HTN, Heart diseases.   Prior to Admission medications   Medication  Sig Start Date End Date Taking? Authorizing Provider  albuterol (PROAIR HFA) 108 (90 BASE) MCG/ACT inhaler Inhale 2 puffs into the lungs every 6 (six) hours as needed for wheezing or shortness of breath.    Historical Provider, MD  alprazolam Prudy Feeler(XANAX) 2 MG tablet Take 2 mg by mouth at bedtime as needed for sleep.    Historical Provider, MD  budesonide-formoterol (SYMBICORT) 160-4.5 MCG/ACT inhaler Inhale 2 puffs into the lungs 2 (two) times daily.    Historical Provider, MD  calcium carbonate 200 MG capsule Take 200 mg by mouth daily.    Historical Provider, MD  cyclobenzaprine (FLEXERIL) 5 MG tablet Take 5 mg by mouth 3 (three) times daily as needed for muscle spasms.    Historical Provider, MD  fexofenadine (ALLEGRA) 180 MG tablet Take 180 mg by mouth daily.    Historical Provider, MD  HYDROcodone-acetaminophen (NORCO/VICODIN) 5-325 MG per tablet Take 1 tablet by mouth every 6 (six) hours as needed for moderate pain.    Historical Provider, MD  hyoscyamine (LEVSIN) 0.125 MG/ML solution Take 0.125 mg by mouth every 4 (four) hours as needed.    Historical Provider, MD  levalbuterol Pauline Aus(XOPENEX) 1.25 MG/3ML nebulizer solution Take 1.25 mg by nebulization every 4 (four) hours as needed for wheezing.    Historical Provider, MD  omeprazole (PRILOSEC) 20 MG capsule Take 20 mg by mouth daily.    Historical Provider, MD  ondansetron (ZOFRAN) 4 MG tablet  Take 4 mg by mouth every 8 (eight) hours as needed for nausea or vomiting.    Historical Provider, MD  triamcinolone (NASACORT ALLERGY 24HR) 55 MCG/ACT AERO nasal inhaler Place 2 sprays into the nose daily.    Historical Provider, MD  Vitamin D, Ergocalciferol, (DRISDOL) 50000 UNITS CAPS capsule Take 50,000 Units by mouth every 7 (seven) days.    Historical Provider, MD   Physical Exam: Filed Vitals:   03/05/14 2105  BP: 109/73  Pulse: 105  Temp: 98.3 F (36.8 C)  Resp: 28    BP 109/73  Pulse 105  Temp(Src) 98.3 F (36.8 C) (Oral)  Resp 28  Ht 5'  5" (1.651 m)  Wt 82.6 kg (182 lb 1.6 oz)  BMI 30.30 kg/m2  SpO2 97%  General:  Appears calm and comfortable Eyes: PERRL, normal lids, irises & conjunctiva ENT: grossly normal hearing, lips & tongue Neck: no LAD, masses or thyromegaly Cardiovascular: RRR, no m/r/g. No LE edema. Respiratory: few ronchus right lung, no wheezing, Normal respiratory effort. Abdomen: soft, ntnd Skin: no rash or induration seen on limited exam Musculoskeletal: grossly normal tone BUE/BLE Psychiatric: grossly normal mood and affect, speech fluent and appropriate Neurologic: grossly non-focal.          Labs on Admission:  Basic Metabolic Panel:  Recent Labs Lab 03/05/14 1519  NA 141  K 3.7  CL 103  CO2 26  GLUCOSE 134*  BUN 7  CREATININE 0.70  CALCIUM 9.6   Liver Function Tests:  Recent Labs Lab 03/05/14 1519  AST 30  ALT 35  ALKPHOS 124*  BILITOT 0.4  PROT 7.0  ALBUMIN 3.9   No results found for this basename: LIPASE, AMYLASE,  in the last 168 hours No results found for this basename: AMMONIA,  in the last 168 hours CBC:  Recent Labs Lab 03/05/14 1519  WBC 10.3  NEUTROABS 8.7*  HGB 12.3  HCT 36.4  MCV 91.9  PLT 195   Cardiac Enzymes:  Recent Labs Lab 03/05/14 1519  TROPONINI <0.30    BNP (last 3 results) No results found for this basename: PROBNP,  in the last 8760 hours CBG: No results found for this basename: GLUCAP,  in the last 168 hours  Radiological Exams on Admission: Dg Chest 2 View  03/05/2014   CLINICAL DATA:  Fever, chills and shortness of breath.  EXAM: CHEST  2 VIEW  COMPARISON:  Chest x-ray 08/09/2013.  FINDINGS: Patchy areas of bronchial wall thickening and ill-defined opacities, most evident in the left lower lobe, concerning for bronchitis with potential left lower lobe bronchopneumonia. No pleural effusions. No evidence of pulmonary edema. Heart size is normal. Small hiatal hernia. Mediastinal contours are otherwise unremarkable.  IMPRESSION: 1. The  appearance the chest is compatible with bronchitis and possible early left lower lobe bronchopneumonia. Repeat radiographs in 2-3 weeks after appropriate trial of antimicrobial therapy are recommended to ensure resolution of these findings. 2. Small hiatal hernia.   Electronically Signed   By: Trudie Reed M.D.   On: 03/05/2014 16:10   Ct Head Wo Contrast  03/05/2014   CLINICAL DATA:  53 year old female with slurred speech and altered mental status.  EXAM: CT HEAD WITHOUT CONTRAST  TECHNIQUE: Contiguous axial images were obtained from the base of the skull through the vertex without intravenous contrast.  COMPARISON:  08/10/2013 CT  FINDINGS: No intracranial abnormalities are identified, including mass lesion or mass effect, hydrocephalus, extra-axial fluid collection, midline shift, hemorrhage, or acute infarction.  The visualized  bony calvarium is unremarkable.  IMPRESSION: Unremarkable noncontrast head CT.   Electronically Signed   By: Laveda AbbeJeff  Hu M.D.   On: 03/05/2014 17:52   Ct Angio Chest Pe W/cm &/or Wo Cm  03/05/2014   CLINICAL DATA:  Weakness and shortness of breath for the past 4 days.  EXAM: CT ANGIOGRAPHY CHEST WITH CONTRAST  TECHNIQUE: Multidetector CT imaging of the chest was performed using the standard protocol during bolus administration of intravenous contrast. Multiplanar CT image reconstructions and MIPs were obtained to evaluate the vascular anatomy.  CONTRAST:  100 mL of Omnipaque 350.  COMPARISON:  Chest CT 08/10/2013.  FINDINGS: Mediastinum: There are no filling defects within the pulmonary arterial tree to suggest underlying pulmonary embolism. Heart size is normal. There is no significant pericardial fluid, thickening or pericardial calcification. No pathologically enlarged mediastinal are hilar lymph nodes. Prominent azygos vein incidentally noted (unchanged compared to the prior examination). Moderate size hiatal hernia.  Lungs/Pleura: There are new patchy areas of  peribronchovascular airspace consolidation in the lungs bilaterally, most significantly in the posterior aspect of the left lower lobe, with an additional focus in the posterior lateral aspect of the right upper lobe. Findings suggest a multilobar bronchopneumonia. Linear areas of scarring are also noted in the lower lobes of the lungs bilaterally. No pleural effusions.  Upper Abdomen: Small amount of pneumobilia (similar to the prior study), presumably related to prior sphincterotomy. Low attenuation throughout the hepatic parenchyma, compatible with hepatic steatosis.  Musculoskeletal: There are no aggressive appearing lytic or blastic lesions noted in the visualized portions of the skeleton.  Review of the MIP images confirms the above findings.  IMPRESSION: 1. No evidence of pulmonary embolism. 2. However, there is multilobar bronchopneumonia in the left lower and right upper lobes, as above. Followup PA and lateral chest radiographs are recommended in 2-3 weeks after appropriate trial of antimicrobial therapy to ensure complete resolution of these findings. 3. Hepatic steatosis.   Electronically Signed   By: Trudie Reedaniel  Entrikin M.D.   On: 03/05/2014 17:56    EKG: Independently reviewed. Sinus tachycardia.   Assessment/Plan Principal Problem:   Sepsis Active Problems:   CAP (community acquired pneumonia)   COPD (chronic obstructive pulmonary disease)   Sarcoidosis  1-Sepsis; likely secondary to PNA. Admitted to step down unit. BP remain in the 110 range. I will continue with IV fluids, IV antibiotics, and stress dose steroids. Blood culture ordered. Check cortisol level. Repeat lactic acid.   2-Multilobar PNA; Will continue with Aztreonam and clindamycin. Patient with multiples antibiotics intolerances. Follow up blood culture. Will order strep antigen and legionella antigen. Screening for HIV.   3-Chest pain; in setting of PNA. CT Angio negative for PE. Cycle cardiac enzymes.   4-COPD; no  wheezing on lung exam. Continue with nebulizer treatment.   Code Status: full code.  Family Communication: care discussed with daughter who was at bedside.  Disposition Plan: expect 3 to 4 days inpatient.   Time spent: 75 minutes.   Hartley Barefootegalado, Korrin Waterfield A [facility] Pager [phone]  **Disclaimer: This note may have been dictated with voice recognition software. Similar sounding words can inadvertently be transcribed and this note may contain transcription errors which may not have been corrected upon publication of note.**

## 2014-03-05 NOTE — Progress Notes (Signed)
Called by Dr Genia Plantsancour-52 yo female hx of COPD-presents with fever, cough. Tachycardic,CT Chest shows bronchopneumonia. BP soft-currently on 3 litre of IVF bolus-BP in high 90's. Lactate normal. Have tentatively accepted to SDU, but have asked EDP to call PCCM if BP drops after 3rd litre fluid bolus.

## 2014-03-05 NOTE — ED Notes (Signed)
Pt reports feeling weak since Thursday.  Sts having chills. Reports recent non productive cough.

## 2014-03-06 DIAGNOSIS — J42 Unspecified chronic bronchitis: Secondary | ICD-10-CM

## 2014-03-06 LAB — BASIC METABOLIC PANEL
BUN: 7 mg/dL (ref 6–23)
CALCIUM: 9 mg/dL (ref 8.4–10.5)
CO2: 21 mEq/L (ref 19–32)
CREATININE: 0.55 mg/dL (ref 0.50–1.10)
Chloride: 109 mEq/L (ref 96–112)
GFR calc Af Amer: >90 mL/min (ref >90)
GFR calc non Af Amer: >90 mL/min (ref >90)
Glucose, Bld: 180 mg/dL — ABNORMAL HIGH (ref 70–99)
Potassium: 3.7 mEq/L (ref 3.7–5.3)
Sodium: 142 mEq/L (ref 137–147)

## 2014-03-06 LAB — STREP PNEUMONIAE URINARY ANTIGEN: STREP PNEUMO URINARY ANTIGEN: NEGATIVE

## 2014-03-06 LAB — CBC
HCT: 31.8 % — ABNORMAL LOW (ref 36.0–46.0)
Hemoglobin: 10.4 g/dL — ABNORMAL LOW (ref 12.0–15.0)
MCH: 29.8 pg (ref 26.0–34.0)
MCHC: 32.7 g/dL (ref 30.0–36.0)
MCV: 91.1 fL (ref 78.0–100.0)
PLATELETS: 172 10*3/uL (ref 150–400)
RBC: 3.49 MIL/uL — ABNORMAL LOW (ref 3.87–5.11)
RDW: 12.4 % (ref 11.5–15.5)
WBC: 14.7 10*3/uL — ABNORMAL HIGH (ref 4.0–10.5)

## 2014-03-06 LAB — TROPONIN I
Troponin I: <0.3 ng/mL (ref <0.30)
Troponin I: <0.3 ng/mL (ref <0.30)

## 2014-03-06 LAB — CORTISOL: CORTISOL PLASMA: 37 ug/dL

## 2014-03-06 LAB — HIV ANTIBODY (ROUTINE TESTING W REFLEX): HIV: NONREACTIVE

## 2014-03-06 LAB — LACTIC ACID, PLASMA: Lactic Acid, Venous: 3.1 mmol/L — ABNORMAL HIGH (ref 0.5–2.2)

## 2014-03-06 MED ORDER — KETOROLAC TROMETHAMINE 30 MG/ML IJ SOLN
30.0000 mg | Freq: Once | INTRAMUSCULAR | Status: AC
  Administered 2014-03-06: 30 mg via INTRAVENOUS
  Filled 2014-03-06: qty 1

## 2014-03-06 MED ORDER — ALPRAZOLAM 0.25 MG PO TABS
0.2500 mg | ORAL_TABLET | Freq: Once | ORAL | Status: AC
  Administered 2014-03-06: 0.25 mg via ORAL
  Filled 2014-03-06: qty 1

## 2014-03-06 MED ORDER — HYDROCODONE-ACETAMINOPHEN 5-325 MG PO TABS
2.0000 | ORAL_TABLET | Freq: Once | ORAL | Status: AC
  Administered 2014-03-06: 2 via ORAL
  Filled 2014-03-06: qty 2

## 2014-03-06 MED ORDER — KETOROLAC TROMETHAMINE 10 MG PO TABS
10.0000 mg | ORAL_TABLET | Freq: Once | ORAL | Status: AC
  Administered 2014-03-06: 10 mg via ORAL
  Filled 2014-03-06: qty 1

## 2014-03-06 MED ORDER — HYDROCODONE-ACETAMINOPHEN 5-325 MG PO TABS
1.0000 | ORAL_TABLET | ORAL | Status: DC | PRN
  Filled 2014-03-06: qty 1

## 2014-03-06 NOTE — Progress Notes (Signed)
Chaplain responded to request for advanced directive but pt sleeping.

## 2014-03-06 NOTE — Progress Notes (Signed)
Utilization Review Completed.Dowell, Deborah T6/22/2015  

## 2014-03-06 NOTE — Progress Notes (Addendum)
Pt told Verlee MonteDora, RN that her watch was missing. Charge nurse, Boneta LucksJenny RN went in to talk to pt per pt request. She stated that she noticed tonight that her watch was missing. Pt stated that she has not seen it since she was at [facility]. Pt was a transfer from [facility] today. Dora, RN called [facility] to check if they had watch, they stated no. Nurse called [facility] and spoke to security. Security at [facility] stated that no one had turned in watch. Nelda MarseilleJenny Thacker, RN (charge nurse)

## 2014-03-06 NOTE — Progress Notes (Signed)
[facility] PROGRESS NOTE  Holly MuscaRobin Rogers RUE:[id number] DOB: 1961/03/23 DOA: 03/05/2014 PCP: Jessee AversBORG,ROBERT TED, MD  Assessment/Plan: 1-Sepsis -due to pneumonia -improving, continue current Abx -cut down IVF  2-Multilobar PNA;  -Will continue with Aztreonam and clindamycin -. Patient with multiples antibiotics intolerances - Follow up blood culture. - FU strep antigen and legionella antigen.  - HIV screen pending  3-Chest pain; Pleuritic in setting of PNA. CT Angio negative for PE.  - cardiac enzymes negative  4-COPD;  -stable -no wheezing on lung exam. Continue with nebulizer treatment.   Code Status: full code.  Family Communication: none at bedside.  Disposition Plan: home in 2-3days, [place]   HPI/Subjective: Feels better, some cough  Objective: Filed Vitals:   03/06/14 1041  BP: 97/62  Pulse: 93  Temp: 97.7 F (36.5 C)  Resp: 20    Intake/Output Summary (Last 24 hours) at 03/06/14 1119 Last data filed at 03/06/14 1000  Gross per 24 hour  Intake 1131.67 ml  Output   5305 ml  Net -4173.33 ml   Filed Weights   03/05/14 1504 03/05/14 2105  Weight: 74.844 kg (165 lb) 82.6 kg (182 lb 1.6 oz)    Exam:   General: AAOx3  Cardiovascular:S1S2/RRR  Respiratory: ronchi at R base, poor air movement b/l  Abdomen: soft, Nt, BS present  Musculoskeletal: no edema c/c  Data Reviewed: Basic Metabolic Panel:  Recent Labs Lab 03/05/14 1519 03/06/14 0853  NA 141 142  K 3.7 3.7  CL 103 109  CO2 26 21  GLUCOSE 134* 180*  BUN 7 7  CREATININE 0.70 0.55  CALCIUM 9.6 9.0   Liver Function Tests:  Recent Labs Lab 03/05/14 1519  AST 30  ALT 35  ALKPHOS 124*  BILITOT 0.4  PROT 7.0  ALBUMIN 3.9   No results found for this basename: LIPASE, AMYLASE,  in the last 168 hours No results found for this basename: AMMONIA,  in the last 168 hours CBC:  Recent Labs Lab 03/05/14 1519 03/06/14 0853  WBC 10.3 14.7*  NEUTROABS 8.7*  --   HGB 12.3  10.4*  HCT 36.4 31.8*  MCV 91.9 91.1  PLT 195 172   Cardiac Enzymes:  Recent Labs Lab 03/05/14 1519 03/05/14 2315 03/06/14 0413  TROPONINI <0.30 <0.30 <0.30   BNP (last 3 results) No results found for this basename: PROBNP,  in the last 8760 hours CBG: No results found for this basename: GLUCAP,  in the last 168 hours  Recent Results (from the past 240 hour(s))  RAPID STREP SCREEN     Status: None   Collection Time    03/05/14  3:33 PM      Result Value Ref Range Status   Streptococcus, Group A Screen (Direct) NEGATIVE  NEGATIVE Final   Comment: (NOTE)     A Rapid Antigen test may result negative if the antigen level in the     sample is below the detection level of this test. The FDA has not     cleared this test as a stand-alone test therefore the rapid antigen     negative result has reflexed to a Group A Strep culture.  CULTURE, GROUP A STREP     Status: None   Collection Time    03/05/14  3:33 PM      Result Value Ref Range Status   Specimen Description THROAT   Final   Special Requests NONE   Final   Culture     Final  Value: NO SUSPICIOUS COLONIES, CONTINUING TO HOLD     Performed at [facility]   Report Status PENDING   Incomplete  CULTURE, BLOOD (ROUTINE X 2)     Status: None   Collection Time    03/05/14  3:49 PM      Result Value Ref Range Status   Specimen Description BLOOD LEFT WRIST   Final   Special Requests BOTTLES DRAWN AEROBIC AND ANAEROBIC 5CC   Final   Culture  Setup Time     Final   Value: 03/05/2014 22:49     Performed at [facility]   Culture     Final   Value:        BLOOD CULTURE RECEIVED NO GROWTH TO DATE CULTURE WILL BE HELD FOR 5 DAYS BEFORE ISSUING A FINAL NEGATIVE REPORT     Performed at [facility]   Report Status PENDING   Incomplete  CULTURE, BLOOD (ROUTINE X 2)     Status: None   Collection Time    03/05/14  3:50 PM      Result Value Ref Range Status   Specimen Description BLOOD RIGHT ANTECUBITAL    Final   Special Requests BOTTLES DRAWN AEROBIC AND ANAEROBIC 5CC   Final   Culture  Setup Time     Final   Value: 03/05/2014 22:49     Performed at [facility]   Culture     Final   Value:        BLOOD CULTURE RECEIVED NO GROWTH TO DATE CULTURE WILL BE HELD FOR 5 DAYS BEFORE ISSUING A FINAL NEGATIVE REPORT     Performed at [facility]   Report Status PENDING   Incomplete  MRSA PCR SCREENING     Status: None   Collection Time    03/05/14  9:02 PM      Result Value Ref Range Status   MRSA by PCR NEGATIVE  NEGATIVE Final   Comment:            The GeneXpert MRSA Assay (FDA     approved for NASAL specimens     only), is one component of a     comprehensive MRSA colonization     surveillance program. It is not     intended to diagnose MRSA     infection nor to guide or     monitor treatment for     MRSA infections.     Studies: Dg Chest 2 View  03/05/2014   CLINICAL DATA:  Fever, chills and shortness of breath.  EXAM: CHEST  2 VIEW  COMPARISON:  Chest x-ray 08/09/2013.  FINDINGS: Patchy areas of bronchial wall thickening and ill-defined opacities, most evident in the left lower lobe, concerning for bronchitis with potential left lower lobe bronchopneumonia. No pleural effusions. No evidence of pulmonary edema. Heart size is normal. Small hiatal hernia. Mediastinal contours are otherwise unremarkable.  IMPRESSION: 1. The appearance the chest is compatible with bronchitis and possible early left lower lobe bronchopneumonia. Repeat radiographs in 2-3 weeks after appropriate trial of antimicrobial therapy are recommended to ensure resolution of these findings. 2. Small hiatal hernia.   Electronically Signed   By: Trudie Reed M.D.   On: 03/05/2014 16:10   Ct Head Wo Contrast  03/05/2014   CLINICAL DATA:  53 year old female with slurred speech and altered mental status.  EXAM: CT HEAD WITHOUT CONTRAST  TECHNIQUE: Contiguous axial images were obtained from the base of the  skull  through the vertex without intravenous contrast.  COMPARISON:  08/10/2013 CT  FINDINGS: No intracranial abnormalities are identified, including mass lesion or mass effect, hydrocephalus, extra-axial fluid collection, midline shift, hemorrhage, or acute infarction.  The visualized bony calvarium is unremarkable.  IMPRESSION: Unremarkable noncontrast head CT.   Electronically Signed   By: Laveda AbbeJeff  Hu M.D.   On: 03/05/2014 17:52   Ct Angio Chest Pe W/cm &/or Wo Cm  03/05/2014   CLINICAL DATA:  Weakness and shortness of breath for the past 4 days.  EXAM: CT ANGIOGRAPHY CHEST WITH CONTRAST  TECHNIQUE: Multidetector CT imaging of the chest was performed using the standard protocol during bolus administration of intravenous contrast. Multiplanar CT image reconstructions and MIPs were obtained to evaluate the vascular anatomy.  CONTRAST:  100 mL of Omnipaque 350.  COMPARISON:  Chest CT 08/10/2013.  FINDINGS: Mediastinum: There are no filling defects within the pulmonary arterial tree to suggest underlying pulmonary embolism. Heart size is normal. There is no significant pericardial fluid, thickening or pericardial calcification. No pathologically enlarged mediastinal are hilar lymph nodes. Prominent azygos vein incidentally noted (unchanged compared to the prior examination). Moderate size hiatal hernia.  Lungs/Pleura: There are new patchy areas of peribronchovascular airspace consolidation in the lungs bilaterally, most significantly in the posterior aspect of the left lower lobe, with an additional focus in the posterior lateral aspect of the right upper lobe. Findings suggest a multilobar bronchopneumonia. Linear areas of scarring are also noted in the lower lobes of the lungs bilaterally. No pleural effusions.  Upper Abdomen: Small amount of pneumobilia (similar to the prior study), presumably related to prior sphincterotomy. Low attenuation throughout the hepatic parenchyma, compatible with hepatic steatosis.   Musculoskeletal: There are no aggressive appearing lytic or blastic lesions noted in the visualized portions of the skeleton.  Review of the MIP images confirms the above findings.  IMPRESSION: 1. No evidence of pulmonary embolism. 2. However, there is multilobar bronchopneumonia in the left lower and right upper lobes, as above. Followup PA and lateral chest radiographs are recommended in 2-3 weeks after appropriate trial of antimicrobial therapy to ensure complete resolution of these findings. 3. Hepatic steatosis.   Electronically Signed   By: Trudie Reedaniel  Entrikin M.D.   On: 03/05/2014 17:56    Scheduled Meds: . aztreonam  1 g Intravenous 3 times per day  . clindamycin (CLEOCIN) IV  600 mg Intravenous 3 times per day  . enoxaparin (LOVENOX) injection  40 mg Subcutaneous Q24H  . hydrocortisone sod succinate (SOLU-CORTEF) inj  100 mg Intravenous Q6H  . pantoprazole  40 mg Oral Daily  . triamcinolone  2 spray Nasal Daily   Continuous Infusions: . sodium chloride 100 mL/hr at 03/06/14 0912   Antibiotics Given (last 72 hours)   Date/Time Action Medication Dose Rate   03/05/14 2241 Given   aztreonam (AZACTAM) 1 g in dextrose 5 % 50 mL IVPB 1 g 100 mL/hr   03/05/14 2314 Given   clindamycin (CLEOCIN) IVPB 600 mg 600 mg 100 mL/hr   03/06/14 0522 Given   aztreonam (AZACTAM) 1 g in dextrose 5 % 50 mL IVPB 1 g 100 mL/hr   03/06/14 [id number] Given   clindamycin (CLEOCIN) IVPB 600 mg 600 mg 100 mL/hr      Principal Problem:   Sepsis Active Problems:   CAP (community acquired pneumonia)   COPD (chronic obstructive pulmonary disease)   Sarcoidosis    Time spent:6430min    [facility] Surgical Suites LLCJOSEPH,PREETHA  [facility] Pager [phone]. If 7PM-7AM, please contact  night-coverage at [email or web address], password [facility] 03/06/2014, 11:19 AM  LOS: 1 day

## 2014-03-06 NOTE — Progress Notes (Signed)
Patient asked RN if she could have something else for pain. Np, Schorr has been notified. Awaiting news orders

## 2014-03-06 NOTE — Progress Notes (Signed)
CSW paged chaplain to have advanced directives completed for pt. Chaplain to follow up with pt. CSW signing off.   Maryclare LabradorJulie Anderson, MSW, [facility] Sanfernando De CarolinaCSWA Clinical Social Worker [phone]

## 2014-03-06 NOTE — Progress Notes (Signed)
First flexeril fell on floor. RN got a second dose. Giving to patient now.

## 2014-03-07 DIAGNOSIS — J438 Other emphysema: Secondary | ICD-10-CM

## 2014-03-07 LAB — CBC
HCT: 30.7 % — ABNORMAL LOW (ref 36.0–46.0)
Hemoglobin: 10 g/dL — ABNORMAL LOW (ref 12.0–15.0)
MCH: 30.1 pg (ref 26.0–34.0)
MCHC: 32.6 g/dL (ref 30.0–36.0)
MCV: 92.5 fL (ref 78.0–100.0)
Platelets: 173 10*3/uL (ref 150–400)
RBC: 3.32 MIL/uL — ABNORMAL LOW (ref 3.87–5.11)
RDW: 12.7 % (ref 11.5–15.5)
WBC: 13.1 10*3/uL — AB (ref 4.0–10.5)

## 2014-03-07 LAB — BASIC METABOLIC PANEL
BUN: 9 mg/dL (ref 6–23)
CALCIUM: 9 mg/dL (ref 8.4–10.5)
CHLORIDE: 107 meq/L (ref 96–112)
CO2: 24 mEq/L (ref 19–32)
CREATININE: 0.79 mg/dL (ref 0.50–1.10)
GFR calc non Af Amer: >90 mL/min (ref >90)
Glucose, Bld: 140 mg/dL — ABNORMAL HIGH (ref 70–99)
Potassium: 3.1 mEq/L — ABNORMAL LOW (ref 3.7–5.3)
Sodium: 145 mEq/L (ref 137–147)

## 2014-03-07 LAB — LEGIONELLA ANTIGEN, URINE: LEGIONELLA ANTIGEN, URINE: NEGATIVE

## 2014-03-07 LAB — URINE CULTURE: Colony Count: 5000

## 2014-03-07 LAB — CULTURE, GROUP A STREP

## 2014-03-07 LAB — LACTIC ACID, PLASMA: Lactic Acid, Venous: 1.4 mmol/L (ref 0.5–2.2)

## 2014-03-07 MED ORDER — KETOROLAC TROMETHAMINE 10 MG PO TABS
10.0000 mg | ORAL_TABLET | Freq: Four times a day (QID) | ORAL | Status: AC | PRN
  Filled 2014-03-07: qty 1

## 2014-03-07 MED ORDER — OXYCODONE-ACETAMINOPHEN 5-325 MG PO TABS
1.0000 | ORAL_TABLET | Freq: Four times a day (QID) | ORAL | Status: DC | PRN
  Administered 2014-03-07 – 2014-03-08 (×3): 1 via ORAL
  Filled 2014-03-07 (×3): qty 1

## 2014-03-07 MED ORDER — ALPRAZOLAM 0.5 MG PO TABS
2.0000 mg | ORAL_TABLET | Freq: Two times a day (BID) | ORAL | Status: DC | PRN
  Administered 2014-03-07 – 2014-03-08 (×3): 2 mg via ORAL
  Filled 2014-03-07 (×3): qty 4

## 2014-03-07 MED ORDER — TRIAMCINOLONE ACETONIDE 55 MCG/ACT NA AERO
2.0000 | INHALATION_SPRAY | Freq: Every day | NASAL | Status: DC
  Administered 2014-03-07 – 2014-03-08 (×2): 2 via NASAL
  Filled 2014-03-07: qty 21.6

## 2014-03-07 MED ORDER — PREDNISONE 20 MG PO TABS
40.0000 mg | ORAL_TABLET | Freq: Every day | ORAL | Status: DC
  Administered 2014-03-07 – 2014-03-08 (×2): 40 mg via ORAL
  Filled 2014-03-07 (×3): qty 2

## 2014-03-07 MED ORDER — POTASSIUM CHLORIDE CRYS ER 20 MEQ PO TBCR
20.0000 meq | EXTENDED_RELEASE_TABLET | Freq: Once | ORAL | Status: AC
  Administered 2014-03-07: 20 meq via ORAL

## 2014-03-07 NOTE — Progress Notes (Signed)
PT Cancellation Note  Patient Details Name: Holly MuscaRobin Rogers MRN: [id number] DOB: 05-02-1961   Cancelled Treatment:    Reason Eval/Treat Not Completed: Medical issues which prohibited therapy (pt with hypokalemia, not yet repleting, not on telemetry, and reported chest pain with walking within the hour with EKG refused. Will wait for potassium repletion in light of lab values without EKG or tele to monitor arrhythmia)   Toney Sangabor, Hanan Moen [facility] LLC[facility]eth 03/07/2014, 11:55 AM Delaney MeigsMaija Tabor Deniece Rankin, PT [phone]

## 2014-03-07 NOTE — Progress Notes (Signed)
CARE MANAGEMENT NOTE 03/07/2014  Patient:  Holly Rogers,Holly Rogers   Account Number:  [id number]  Date Initiated:  03/06/2014  Documentation initiated by:  Junius CreamerWELL,DEBBIE  Subjective/Objective Assessment:   adm w sepsis, CAP     Action/Plan:   lives w fam, pcp dr r toberg   Anticipated DC Date:  03/09/2014   Anticipated DC Plan:  HOME/SELF CARE      DC Planning Services  CM consult      Choice offered to / List presented to:             Status of service:  Completed, signed off Medicare Important Message given?  YES (If response is "NO", the following Medicare IM given date fields will be blank) Date Medicare IM given:  03/07/2014 Date Additional Medicare IM given:    Discharge Disposition:  HOME/SELF CARE  Per UR Regulation:  Reviewed for med. necessity/level of care/duration of stay  If discussed at Long Length of Stay Meetings, dates discussed:    Comments:  03/07/2014 1220 NCM spoke to pt and she lives at home with boyfriend. She is able to get her medications. Waiting final dc plan. NCM will continue to follow for dc needs. Isidoro DonningAlesia Shavis RN CCM Case Mgmt phone [phone]

## 2014-03-07 NOTE — Progress Notes (Signed)
Patient is upset that NP did not personally come up to see her when she complained about her chest pain- she is also upset at RN that she was not given an EKG early enough after stating her chest pain. Patient is also upset at NT. NP, Schorr has been notified.

## 2014-03-07 NOTE — Progress Notes (Signed)
Patient was concerned that her new IV was not in the right place. Two RN's (Dora and Boneta LucksJenny "charge nurse")verified that IV was indeed in the right place and working properly.

## 2014-03-07 NOTE — Progress Notes (Signed)
Pt c/o chest pains after walking. She states "it felt like sharp pain, 4 times and the it stopped." Vitals were obtained (stable)  but pt refused EKG stating that she didn't think it was a heart attack. Jomarie LongsJoseph MD made aware.

## 2014-03-07 NOTE — Progress Notes (Addendum)
[facility] PROGRESS NOTE  Maggie Senseney MWN:[id number] DOB: February 25, 1961 DOA: 03/05/2014 PCP: Betsy Pries, MD  Brief Narrative Chief Complaint: Cough, chest pain.  Holly Rogers is a 53 y.o. female with PMH significant for sarcoidosis, Mitral valve prolapse, COPD who presents complaining of chest pain, pleuritic in nature, intermittent that started few days ago. Patient also relates cough, dry at times, chills, sore throat SOB that started 3 to 4 days prior to admission. She was hypotensive and febrile on admission, met criteria for Sepsis, much improved clinically  Assessment/Plan: 1-Sepsis -much imporved -due to pneumonia -improving, continue current Abx -cut down IVF  2-Multilobar PNA;  - Will continue with Aztreonam and clindamycin - Patient with multiples antibiotics intolerances, could change to Augmentin at DC, has nausea as Side effect with this - blood cultures negative - strep antigen and legionella antigen negative - HIV non reactive  3-Chest pain; -Pleuritic in setting of PNA. CT Angio negative for PE.  - cardiac enzymes negative  4-COPD;  -with mild flare, taper Prednisone -Continue with nebulizer treatment.   5. Chronic back pain/DJD/fibromyalgia/chronic fatigue syndrome -fixated on these symptoms today -continue home meds, norco changed to percocet  Code Status: full code.  Family Communication: none at bedside.  Disposition Plan: home tomorrow if stable   HPI/Subjective: Complaining abt staff, not enough xanax, need more pain meds  Objective: Filed Vitals:   03/07/14 0632  BP: 100/65  Pulse: 85  Temp: 98.4 F (36.9 C)  Resp: 18    Intake/Output Summary (Last 24 hours) at 03/07/14 1027 Last data filed at 03/06/14 2218  Gross per 24 hour  Intake   1140 ml  Output      0 ml  Net   1140 ml   Filed Weights   03/05/14 1504 03/05/14 2105 03/07/14 0500  Weight: 74.844 kg (165 lb) 82.6 kg (182 lb 1.6 oz) 82.918 kg (182 lb 12.8 oz)     Exam:   General: AAOx3  Cardiovascular:S1S2/RRR  Respiratory: ronchi at R base, improved airmovement  Abdomen: soft, Nt, BS present  Musculoskeletal: no edema c/c  Data Reviewed: Basic Metabolic Panel:  Recent Labs Lab 03/05/14 1519 03/06/14 0853 03/07/14 0325  NA 141 142 145  K 3.7 3.7 3.1*  CL 103 109 107  CO2 26 21 24   GLUCOSE 134* 180* 140*  BUN 7 7 9   CREATININE 0.70 0.55 0.79  CALCIUM 9.6 9.0 9.0   Liver Function Tests:  Recent Labs Lab 03/05/14 1519  AST 30  ALT 35  ALKPHOS 124*  BILITOT 0.4  PROT 7.0  ALBUMIN 3.9   No results found for this basename: LIPASE, AMYLASE,  in the last 168 hours No results found for this basename: AMMONIA,  in the last 168 hours CBC:  Recent Labs Lab 03/05/14 1519 03/06/14 0853 03/07/14 0325  WBC 10.3 14.7* 13.1*  NEUTROABS 8.7*  --   --   HGB 12.3 10.4* 10.0*  HCT 36.4 31.8* 30.7*  MCV 91.9 91.1 92.5  PLT 195 172 173   Cardiac Enzymes:  Recent Labs Lab 03/05/14 1519 03/05/14 2315 03/06/14 0413  TROPONINI <0.30 <0.30 <0.30   BNP (last 3 results) No results found for this basename: PROBNP,  in the last 8760 hours CBG: No results found for this basename: GLUCAP,  in the last 168 hours  Recent Results (from the past 240 hour(s))  RAPID STREP SCREEN     Status: None   Collection Time    03/05/14  3:33 PM  Result Value Ref Range Status   Streptococcus, Group A Screen (Direct) NEGATIVE  NEGATIVE Final   Comment: (NOTE)     A Rapid Antigen test may result negative if the antigen level in the     sample is below the detection level of this test. The FDA has not     cleared this test as a stand-alone test therefore the rapid antigen     negative result has reflexed to a Group A Strep culture.  CULTURE, GROUP A STREP     Status: None   Collection Time    03/05/14  3:33 PM      Result Value Ref Range Status   Specimen Description THROAT   Final   Special Requests NONE   Final   Culture      Final   Value: No Beta Hemolytic Streptococci Isolated     Performed at [facility]   Report Status 03/07/2014 FINAL   Final  CULTURE, BLOOD (ROUTINE X 2)     Status: None   Collection Time    03/05/14  3:49 PM      Result Value Ref Range Status   Specimen Description BLOOD LEFT WRIST   Final   Special Requests BOTTLES DRAWN AEROBIC AND ANAEROBIC 5CC   Final   Culture  Setup Time     Final   Value: 03/05/2014 22:49     Performed at [facility]   Culture     Final   Value:        BLOOD CULTURE RECEIVED NO GROWTH TO DATE CULTURE WILL BE HELD FOR 5 DAYS BEFORE ISSUING A FINAL NEGATIVE REPORT     Performed at [facility]   Report Status PENDING   Incomplete  CULTURE, BLOOD (ROUTINE X 2)     Status: None   Collection Time    03/05/14  3:50 PM      Result Value Ref Range Status   Specimen Description BLOOD RIGHT ANTECUBITAL   Final   Special Requests BOTTLES DRAWN AEROBIC AND ANAEROBIC 5CC   Final   Culture  Setup Time     Final   Value: 03/05/2014 22:49     Performed at [facility]   Culture     Final   Value:        BLOOD CULTURE RECEIVED NO GROWTH TO DATE CULTURE WILL BE HELD FOR 5 DAYS BEFORE ISSUING A FINAL NEGATIVE REPORT     Performed at [facility]   Report Status PENDING   Incomplete  URINE CULTURE     Status: None   Collection Time    03/05/14  4:32 PM      Result Value Ref Range Status   Specimen Description URINE, CLEAN CATCH   Final   Special Requests NONE   Final   Culture  Setup Time     Final   Value: 03/05/2014 22:54     Performed at [place]     Final   Value: 5,000 COLONIES/ML     Performed at [facility]   Culture     Final   Value: INSIGNIFICANT GROWTH     Performed at [facility]   Report Status 03/07/2014 FINAL   Final  MRSA PCR SCREENING     Status: None   Collection Time    03/05/14  9:02 PM      Result Value Ref Range Status   MRSA by  PCR NEGATIVE  NEGATIVE  Final   Comment:            The GeneXpert MRSA Assay (FDA     approved for NASAL specimens     only), is one component of a     comprehensive MRSA colonization     surveillance program. It is not     intended to diagnose MRSA     infection nor to guide or     monitor treatment for     MRSA infections.     Studies: Dg Chest 2 View  03/05/2014   CLINICAL DATA:  Fever, chills and shortness of breath.  EXAM: CHEST  2 VIEW  COMPARISON:  Chest x-ray 08/09/2013.  FINDINGS: Patchy areas of bronchial wall thickening and ill-defined opacities, most evident in the left lower lobe, concerning for bronchitis with potential left lower lobe bronchopneumonia. No pleural effusions. No evidence of pulmonary edema. Heart size is normal. Small hiatal hernia. Mediastinal contours are otherwise unremarkable.  IMPRESSION: 1. The appearance the chest is compatible with bronchitis and possible early left lower lobe bronchopneumonia. Repeat radiographs in 2-3 weeks after appropriate trial of antimicrobial therapy are recommended to ensure resolution of these findings. 2. Small hiatal hernia.   Electronically Signed   By: Vinnie Langton M.D.   On: 03/05/2014 16:10   Ct Head Wo Contrast  03/05/2014   CLINICAL DATA:  53 year old female with slurred speech and altered mental status.  EXAM: CT HEAD WITHOUT CONTRAST  TECHNIQUE: Contiguous axial images were obtained from the base of the skull through the vertex without intravenous contrast.  COMPARISON:  08/10/2013 CT  FINDINGS: No intracranial abnormalities are identified, including mass lesion or mass effect, hydrocephalus, extra-axial fluid collection, midline shift, hemorrhage, or acute infarction.  The visualized bony calvarium is unremarkable.  IMPRESSION: Unremarkable noncontrast head CT.   Electronically Signed   By: Hassan Rowan M.D.   On: 03/05/2014 17:52   Ct Angio Chest Pe W/cm &/or Wo Cm  03/05/2014   CLINICAL DATA:  Weakness and shortness of breath for the past 4  days.  EXAM: CT ANGIOGRAPHY CHEST WITH CONTRAST  TECHNIQUE: Multidetector CT imaging of the chest was performed using the standard protocol during bolus administration of intravenous contrast. Multiplanar CT image reconstructions and MIPs were obtained to evaluate the vascular anatomy.  CONTRAST:  100 mL of Omnipaque 350.  COMPARISON:  Chest CT 08/10/2013.  FINDINGS: Mediastinum: There are no filling defects within the pulmonary arterial tree to suggest underlying pulmonary embolism. Heart size is normal. There is no significant pericardial fluid, thickening or pericardial calcification. No pathologically enlarged mediastinal are hilar lymph nodes. Prominent azygos vein incidentally noted (unchanged compared to the prior examination). Moderate size hiatal hernia.  Lungs/Pleura: There are new patchy areas of peribronchovascular airspace consolidation in the lungs bilaterally, most significantly in the posterior aspect of the left lower lobe, with an additional focus in the posterior lateral aspect of the right upper lobe. Findings suggest a multilobar bronchopneumonia. Linear areas of scarring are also noted in the lower lobes of the lungs bilaterally. No pleural effusions.  Upper Abdomen: Small amount of pneumobilia (similar to the prior study), presumably related to prior sphincterotomy. Low attenuation throughout the hepatic parenchyma, compatible with hepatic steatosis.  Musculoskeletal: There are no aggressive appearing lytic or blastic lesions noted in the visualized portions of the skeleton.  Review of the MIP images confirms the above findings.  IMPRESSION: 1. No evidence of pulmonary embolism. 2. However, there is multilobar  bronchopneumonia in the left lower and right upper lobes, as above. Followup PA and lateral chest radiographs are recommended in 2-3 weeks after appropriate trial of antimicrobial therapy to ensure complete resolution of these findings. 3. Hepatic steatosis.   Electronically Signed   By:  Vinnie Langton M.D.   On: 03/05/2014 17:56    Scheduled Meds: . aztreonam  1 g Intravenous 3 times per day  . clindamycin (CLEOCIN) IV  600 mg Intravenous 3 times per day  . enoxaparin (LOVENOX) injection  40 mg Subcutaneous Q24H  . pantoprazole  40 mg Oral Daily  . triamcinolone  2 spray Nasal Daily   Continuous Infusions: . sodium chloride 50 mL/hr (03/07/14 0944)   Antibiotics Given (last 72 hours)   Date/Time Action Medication Dose Rate   03/05/14 2241 Given   aztreonam (AZACTAM) 1 g in dextrose 5 % 50 mL IVPB 1 g 100 mL/hr   03/05/14 2314 Given   clindamycin (CLEOCIN) IVPB 600 mg 600 mg 100 mL/hr   03/06/14 0522 Given   aztreonam (AZACTAM) 1 g in dextrose 5 % 50 mL IVPB 1 g 100 mL/hr   03/06/14 8264 Given   clindamycin (CLEOCIN) IVPB 600 mg 600 mg 100 mL/hr   03/06/14 1349 Given   aztreonam (AZACTAM) 1 g in dextrose 5 % 50 mL IVPB 1 g 100 mL/hr   03/06/14 1516 Given   clindamycin (CLEOCIN) IVPB 600 mg 600 mg 100 mL/hr   03/06/14 2103 Given   aztreonam (AZACTAM) 1 g in dextrose 5 % 50 mL IVPB 1 g 100 mL/hr   03/06/14 2240 Given   clindamycin (CLEOCIN) IVPB 600 mg 600 mg 100 mL/hr   03/07/14 0631 Given   clindamycin (CLEOCIN) IVPB 600 mg 600 mg 100 mL/hr   03/07/14 1583 Given   aztreonam (AZACTAM) 1 g in dextrose 5 % 50 mL IVPB 1 g 100 mL/hr      Principal Problem:   Sepsis Active Problems:   CAP (community acquired pneumonia)   COPD (chronic obstructive pulmonary disease)   Sarcoidosis    Time spent:31mn    [facility] Pager [phone] If 7PM-7AM, please contact night-coverage at [email or web address], password [email or web address]6/23/2015, 10:27 AM  LOS: 2 days

## 2014-03-07 NOTE — Progress Notes (Signed)
Patient is still upset- states that she would like to leave and go to [facility]. Charge nurse in room speaking with patient now.

## 2014-03-07 NOTE — Progress Notes (Signed)
Patient asked RN if she could have the EKG that she refused earlier in the day- RN notified NP. Awaiting order

## 2014-03-08 LAB — CBC
HCT: 30 % — ABNORMAL LOW (ref 36.0–46.0)
Hemoglobin: 10.2 g/dL — ABNORMAL LOW (ref 12.0–15.0)
MCH: 30.6 pg (ref 26.0–34.0)
MCHC: 34 g/dL (ref 30.0–36.0)
MCV: 90.1 fL (ref 78.0–100.0)
PLATELETS: 211 10*3/uL (ref 150–400)
RBC: 3.33 MIL/uL — AB (ref 3.87–5.11)
RDW: 12.5 % (ref 11.5–15.5)
WBC: 9.5 10*3/uL (ref 4.0–10.5)

## 2014-03-08 LAB — BASIC METABOLIC PANEL
BUN: 7 mg/dL (ref 6–23)
CO2: 22 meq/L (ref 19–32)
CREATININE: 0.72 mg/dL (ref 0.50–1.10)
Calcium: 9.3 mg/dL (ref 8.4–10.5)
Chloride: 105 mEq/L (ref 96–112)
Glucose, Bld: 102 mg/dL — ABNORMAL HIGH (ref 70–99)
Potassium: 3.9 mEq/L (ref 3.7–5.3)
SODIUM: 143 meq/L (ref 137–147)

## 2014-03-08 LAB — TROPONIN I: Troponin I: <0.3 ng/mL (ref <0.30)

## 2014-03-08 MED ORDER — PROMETHAZINE HCL 12.5 MG PO TABS: 12.5000 mg | ORAL_TABLET | Freq: Four times a day (QID) | ORAL | Status: DC | PRN

## 2014-03-08 MED ORDER — OXYCODONE-ACETAMINOPHEN 5-325 MG PO TABS
2.0000 | ORAL_TABLET | Freq: Once | ORAL | Status: AC
  Administered 2014-03-08: 2 via ORAL
  Filled 2014-03-08: qty 2

## 2014-03-08 MED ORDER — CYCLOBENZAPRINE HCL 10 MG PO TABS
10.0000 mg | ORAL_TABLET | Freq: Once | ORAL | Status: AC
  Administered 2014-03-08: 10 mg via ORAL
  Filled 2014-03-08: qty 1

## 2014-03-08 MED ORDER — AMOXICILLIN-POT CLAVULANATE 875-125 MG PO TABS: 1.0000 | ORAL_TABLET | Freq: Two times a day (BID) | ORAL | Status: DC

## 2014-03-08 MED ORDER — PREDNISONE 20 MG PO TABS: 40.0000 mg | ORAL_TABLET | Freq: Every day | ORAL | Status: DC

## 2014-03-08 NOTE — Progress Notes (Signed)
ANTIBIOTIC CONSULT NOTE - FOLLOW UP  Pharmacy Consult for aztreonam  Indication: pneumonia  Allergies  Allergen Reactions  . Amoxicillin Nausea Only  . Avandia [Rosiglitazone]     Chest pain  . Baclofen Nausea Only    Unknown, patient knows she couldn't take it  . Ceftin [Cefuroxime Axetil]     unknown  . Celebrex [Celecoxib]     Chest pain  . Ciprofloxacin Nausea And Vomiting  . Clindamycin/Lincomycin     unknown  . Doxycycline     unknown  . Flagyl [Metronidazole] Nausea And Vomiting  . Levaquin [Levofloxacin In D5w]     unknown  . Macrobid [employer][Nitrofurantoin Monohyd Macro]     unknown  . Neurontin [Gabapentin]     Gave patient problems  . Other Nausea And Vomiting    Darvocet  . Sulfa Antibiotics Nausea And Vomiting  . Symbicort [Budesonide-Formoterol Fumarate]     Shakes  . Ultram [Tramadol]     unknown  . Vancomycin Nausea And Vomiting  . Vioxx [Rofecoxib]     unknown  . Voltaren [Diclofenac Sodium]     Unknown. But she can use the gel  . Tape Rash    Can use paper tape   Assessment: 52 YOF on D#4 aztreonam and clindamycin for PNA, Improving. Afeb. WBC trending down14.7 >> 9.5. Renal function stable.  azith x1 dose 6/21 CTX x1 dose 6/21 Aztreonam 6/21>> clinda 6/21>>  Rapid Strep neg 6/21 GAS Cx - neg 6/21 BCx2 - ngtd 6/21 UCx - neg  Plan:  - Continue Aztreonam 1g q8h - f/u renal function and cultures  Bayard HuggerMei Bell, PharmD, BCPS  Clinical Pharmacist  Pager: [phone]   03/08/2014,2:32 PM

## 2014-03-08 NOTE — Evaluation (Signed)
Physical Therapy Evaluation Patient Details Name: Holly MuscaRobin Rogers MRN: [id number] DOB: 10/25/1960 Today's Date: 03/08/2014   History of Present Illness  Holly Rogers is a 53 y.o. female with PMH significant for sarcoidosis, Mitral valve prolapse,  COPD who presents complaining of chest pain, pleuritic  in nature, intermittent that started  few days ago. Patient also relates cough, dry at times, chills, SOB that started 3 to 4 days prior to admission. She also relates headaches, joints points, she has notice swelling in her ankle. She denies sick contact. She relates sore throat.  She reports she has the diagnosis of sepsis  Clinical Impression  Holly Rogers was able to move around in her room and walk a long distance in hallway without difficulty. She chose a slow pace and has occasional balance loss that she was able to self correct. She does not need skilled PT follow up.    Follow Up Recommendations No PT follow up    Equipment Recommendations  None recommended by PT    Recommendations for Other Services       Precautions / Restrictions Restrictions Weight Bearing Restrictions: No      Mobility  Bed Mobility Overal bed mobility: Independent                Transfers Overall transfer level: Independent Equipment used: None             General transfer comment: pt moves slowly, but no balance loss identified  Ambulation/Gait Ambulation/Gait assistance: Independent Ambulation Distance (Feet): 500 Feet Assistive device: None Gait Pattern/deviations: WFL(Within Functional Limits) Gait velocity: slow   General Gait Details: pt tries to keep good posture to avoid back pain, she has decreased trunk rotation and decreased arm swing  O2 sats 97%, HR 95 with ambulation  Stairs Stairs: Yes Stairs assistance: Supervision Stair Management: One rail Right Number of Stairs: 1 General stair comments: no hesitation with stair climb attempt  Wheelchair Mobility    Modified  Rankin (Stroke Patients Only)       Balance Overall balance assessment: Modified Independent (slow. occasional loss of balance, but pt able to self correct)                               Standardized Balance Assessment Standardized Balance Assessment : Dynamic Gait Index   Dynamic Gait Index Level Surface: Normal Change in Gait Speed: Normal Gait with Horizontal Head Turns: Normal Gait with Vertical Head Turns: Mild Impairment Gait and Pivot Turn: Normal Step Over Obstacle: Mild Impairment Step Around Obstacles: Normal Steps: Normal Total Score: 22       Pertinent Vitals/Pain No specific complaints of pain.  O2 sat 97% and HR 95 with walking    Home Living Family/patient expects to be discharged to:: Private residence Living Arrangements: Spouse/significant other Available Help at Discharge: Friend(s) Type of Home: Apartment       Home Layout: One level        Prior Function Level of Independence: Independent               Hand Dominance        Extremity/Trunk Assessment               Lower Extremity Assessment: Overall WFL for tasks assessed      Cervical / Trunk Assessment: Normal (pt reports she has neck and back pain that she has had for a long time)  Communication   Communication: No  difficulties (pt talked continually during evaluation session )  Cognition Arousal/Alertness: Awake/alert Behavior During Therapy: WFL for tasks assessed/performed Overall Cognitive Status: Within Functional Limits for tasks assessed                      General Comments      Exercises        Assessment/Plan    PT Assessment Patent does not need any further PT services  PT Diagnosis     PT Problem List    PT Treatment Interventions     PT Goals (Current goals can be found in the Care Plan section) Acute Rehab PT Goals Patient Stated Goal: [facility] [place] [facility] see about her neck and back PT Goal  Formulation: No goals set, d/c therapy    Frequency     Barriers to discharge        Co-evaluation               End of Session   Activity Tolerance: Patient tolerated treatment well Patient left: in bed Nurse Communication: Mobility status         Time: [id number] PT Time Calculation (min): 21 min   Charges:   PT Evaluation $Initial PT Evaluation Tier I: 1 Procedure     PT G Codes:          Holly Rogers, Holly Rogers 03/08/2014, 10:46 AM

## 2014-03-08 NOTE — Discharge Summary (Signed)
Physician Discharge Summary  Holly Rogers ZOX:[id number] DOB: 28-Jan-1961 DOA: 03/05/2014  PCP: Langley Gauss, MD  Admit date: 03/05/2014 Discharge date: 03/08/2014  Time spent: >30 minutes  Recommendations for Outpatient Follow-up:  Follow-up Information   Follow up with TOBORG,ROBERT TED, MD. (in 1-2weeks, call for appt upon discharge)    Specialty:  Family Medicine   Contact information:   [address] [address] [address] [place] [address] [phone]        Discharge Diagnoses:  Principal Problem:   Sepsis Active Problems:   CAP (community acquired pneumonia)   COPD (chronic obstructive pulmonary disease)   Sarcoidosis   Discharge Condition:  Diet recommendation: regular  Filed Weights   03/05/14 2105 03/07/14 0500 03/08/14 0522  Weight: 82.6 kg (182 lb 1.6 oz) 82.918 kg (182 lb 12.8 oz) 83.28 kg (183 lb 9.6 oz)    History of present illness:  Pt is a 53 y.o. female with PMH significant for sarcoidosis, Mitral valve prolapse, COPD who presents complaining of chest pain, pleuritic in nature, intermittent that started few days ago. Patient also relates cough, dry at times, chills, SOB that started 3 to 4 days prior to admission. She also relates headaches, joints points, she has notice swelling in her ankle. She denies sick contact. She relates sore throat.  She vomited after she received antibiotics in the ED. she was admitted for further evaluation and management   Hospital Course:  1-Sepsis As discussed above, patient was noted to be septic on admission -tachycardic, fever to 102 in the ED and hypotensive with systolic blood pressures in the 80 -90s, and this improved with aggressive IV fluids. She had a CT angiogram which revealed multilobar pneumonia and this was thought to be the source of her sepsis. -Blood cultures were obtained on admission and she was started on empiric broad-spectrum IV antibiotics -As she improved clinically hypotension  resolved and her IV fluids were decreased. -Her blood cultures to date have shown no growth, she has continued to improve clinically-and has remained hemodynamically stable. -She is medically ready for discharge at this time>> will be discharged on oral antibiotics, she is to follow up outpatient 2-Multilobar PNA;  - As discussed above she was placed on broad-spectrum IVntibiotics Aztreonam and clindamycin  - Patient with multiples antibiotics intolerances>>change to Augmentin at DC, has nausea as Side effect with this  - blood cultures negative  - strep antigen and legionella antigen negative  - HIV non reactive  - she is clinically improved, and as discussed above will be discharged on Augmentin and is to followup patient with PCP 3-Chest pain;  -Pleuritic in setting of PNA. CT Angio negative for PE.  - cardiac enzymes negative. She remained chest pain-free at this time 4-COPD;  -She was treated with nebulized bronchodilators, prednisone and antibiotics for both as discussed -She is clinically improved and is to follow up outpatient. 5. Chronic back pain/DJD/fibromyalgia/chronic fatigue syndrome  - improved, continue home meds,  she is to continue her outpatient medications and follow up with PCP.   Procedures:  None  Consultations:  None  Discharge Exam: Filed Vitals:   03/08/14 1416  BP: 147/92  Pulse: 69  Temp: 98.1 F (36.7 C)  Resp: 18   Exam:  General: AAOx3  Cardiovascular:S1S2/RRR  Respiratory: Decreased rhonchi, no crackles Abdomen: soft, Nt, BS present  Musculoskeletal: no edema c/c      Discharge Instructions You were cared for by a hospitalist during your hospital stay. If you have any questions about  your discharge medications or the care you received while you were in the hospital after you are discharged, you can call the unit and asked to speak with the hospitalist on call if the hospitalist that took care of you is not available. Once you are  discharged, your primary care physician will handle any further medical issues. Please note that NO REFILLS for any discharge medications will be authorized once you are discharged, as it is imperative that you return to your primary care physician (or establish a relationship with a primary care physician if you do not have one) for your aftercare needs so that they can reassess your need for medications and monitor your lab values.  Discharge Instructions   Diet general    Complete by:  As directed      Increase activity slowly    Complete by:  As directed             Medication List    STOP taking these medications       aspirin-acetaminophen-caffeine 250-250-65 MG per tablet  Commonly known as:  EXCEDRIN MIGRAINE     ibuprofen 200 MG tablet  Commonly known as:  ADVIL,MOTRIN     OVER THE COUNTER MEDICATION     OVER THE COUNTER MEDICATION      TAKE these medications       alprazolam 2 MG tablet  Commonly known as:  XANAX  Take 2 mg by mouth 2 (two) times daily as needed for sleep or anxiety.     amoxicillin-clavulanate 875-125 MG per tablet  Commonly known as:  AUGMENTIN  Take 1 tablet by mouth 2 (two) times daily.     cetirizine 10 MG tablet  Commonly known as:  ZYRTEC  Take 10 mg by mouth daily.     cyclobenzaprine 5 MG tablet  Commonly known as:  FLEXERIL  Take 5 mg by mouth 3 (three) times daily as needed for muscle spasms.     diclofenac sodium 1 % Gel  Commonly known as:  VOLTAREN  Apply 1 application topically 3 (three) times daily as needed (for pain).     fexofenadine 180 MG tablet  Commonly known as:  ALLEGRA  Take 180 mg by mouth daily.     HYDROcodone-acetaminophen 5-325 MG per tablet  Commonly known as:  NORCO/VICODIN  Take 2 tablets by mouth every 6 (six) hours as needed for moderate pain.     hyoscyamine 0.125 MG Tbdp disintergrating tablet  Commonly known as:  ANASPAZ  Place 0.125 mg under the tongue every 4 (four) hours as needed for  bladder spasms.     levalbuterol 1.25 MG/3ML nebulizer solution  Commonly known as:  XOPENEX  Take 1.25 mg by nebulization every 4 (four) hours as needed for wheezing.     levalbuterol 45 MCG/ACT inhaler  Commonly known as:  XOPENEX HFA  Inhale 2 puffs into the lungs every 4 (four) hours as needed for wheezing.     montelukast 10 MG tablet  Commonly known as:  SINGULAIR  Take 10 mg by mouth at bedtime.     NASACORT ALLERGY 24HR 55 MCG/ACT Aero nasal inhaler  Generic drug:  triamcinolone  Place 2 sprays into the nose daily as needed (for stuffy nose).     omeprazole 20 MG capsule  Commonly known as:  PRILOSEC  Take 20 mg by mouth daily.     ondansetron 4 MG tablet  Commonly known as:  ZOFRAN  Take 4 mg by mouth every 8 (  eight) hours as needed for nausea or vomiting.     polyethylene glycol packet  Commonly known as:  MIRALAX / GLYCOLAX  Take 17 g by mouth daily as needed for moderate constipation.     potassium chloride SA 20 MEQ tablet  Commonly known as:  K-DUR,KLOR-CON  Take 20 mEq by mouth daily.     predniSONE 20 MG tablet  Commonly known as:  DELTASONE  Take 2 tablets (40 mg total) by mouth daily with breakfast.     PROAIR HFA 108 (90 BASE) MCG/ACT inhaler  Generic drug:  albuterol  Inhale 2 puffs into the lungs every 6 (six) hours as needed for wheezing or shortness of breath.     PROBIOTIC DAILY PO  Take 1-2 tablets by mouth daily.     promethazine 12.5 MG tablet  Commonly known as:  PHENERGAN  Take 1-2 tablets (12.5-25 mg total) by mouth every 6 (six) hours as needed for nausea or vomiting.     triamcinolone cream 0.1 %  Commonly known as:  KENALOG  Apply 1 application topically daily as needed (for dry skin).     Vitamin D (Ergocalciferol) 50000 UNITS Caps capsule  Commonly known as:  DRISDOL  Take 50,000 Units by mouth every 7 (seven) days.       Allergies  Allergen Reactions  . Amoxicillin Nausea Only  . Avandia [Rosiglitazone]     Chest pain   . Baclofen Nausea Only    Unknown, patient knows she couldn't take it  . Ceftin [Cefuroxime Axetil]     unknown  . Celebrex [Celecoxib]     Chest pain  . Ciprofloxacin Nausea And Vomiting  . Clindamycin/Lincomycin     unknown  . Doxycycline     unknown  . Flagyl [Metronidazole] Nausea And Vomiting  . Levaquin [Levofloxacin In D5w]     unknown  . Macrobid [employer][Nitrofurantoin Monohyd Macro]     unknown  . Neurontin [Gabapentin]     Gave patient problems  . Other Nausea And Vomiting    Darvocet  . Sulfa Antibiotics Nausea And Vomiting  . Symbicort [Budesonide-Formoterol Fumarate]     Shakes  . Ultram [Tramadol]     unknown  . Vancomycin Nausea And Vomiting  . Vioxx [Rofecoxib]     unknown  . Voltaren [Diclofenac Sodium]     Unknown. But she can use the gel  . Tape Rash    Can use paper tape       Follow-up Information   Follow up with TOBORG,ROBERT TED, MD. (in 1-2weeks, call for appt upon discharge)    Specialty:  Family Medicine   Contact information:   [address] [place] [id number] [phone]        The results of significant diagnostics from this hospitalization (including imaging, microbiology, ancillary and laboratory) are listed below for reference.    Significant Diagnostic Studies: Dg Chest 2 View  03/05/2014   CLINICAL DATA:  Fever, chills and shortness of breath.  EXAM: CHEST  2 VIEW  COMPARISON:  Chest x-ray 08/09/2013.  FINDINGS: Patchy areas of bronchial wall thickening and ill-defined opacities, most evident in the left lower lobe, concerning for bronchitis with potential left lower lobe bronchopneumonia. No pleural effusions. No evidence of pulmonary edema. Heart size is normal. Small hiatal hernia. Mediastinal contours are otherwise unremarkable.  IMPRESSION: 1. The appearance the chest is compatible with bronchitis and possible early left lower lobe bronchopneumonia. Repeat radiographs in 2-3 weeks after appropriate  trial  of antimicrobial therapy are recommended to ensure resolution of these findings. 2. Small hiatal hernia.   Electronically Signed   By: Trudie Reed M.D.   On: 03/05/2014 16:10   Ct Head Wo Contrast  03/05/2014   CLINICAL DATA:  53 year old female with slurred speech and altered mental status.  EXAM: CT HEAD WITHOUT CONTRAST  TECHNIQUE: Contiguous axial images were obtained from the base of the skull through the vertex without intravenous contrast.  COMPARISON:  08/10/2013 CT  FINDINGS: No intracranial abnormalities are identified, including mass lesion or mass effect, hydrocephalus, extra-axial fluid collection, midline shift, hemorrhage, or acute infarction.  The visualized bony calvarium is unremarkable.  IMPRESSION: Unremarkable noncontrast head CT.   Electronically Signed   By: Laveda Abbe M.D.   On: 03/05/2014 17:52   Ct Angio Chest Pe W/cm &/or Wo Cm  03/05/2014   CLINICAL DATA:  Weakness and shortness of breath for the past 4 days.  EXAM: CT ANGIOGRAPHY CHEST WITH CONTRAST  TECHNIQUE: Multidetector CT imaging of the chest was performed using the standard protocol during bolus administration of intravenous contrast. Multiplanar CT image reconstructions and MIPs were obtained to evaluate the vascular anatomy.  CONTRAST:  100 mL of Omnipaque 350.  COMPARISON:  Chest CT 08/10/2013.  FINDINGS: Mediastinum: There are no filling defects within the pulmonary arterial tree to suggest underlying pulmonary embolism. Heart size is normal. There is no significant pericardial fluid, thickening or pericardial calcification. No pathologically enlarged mediastinal are hilar lymph nodes. Prominent azygos vein incidentally noted (unchanged compared to the prior examination). Moderate size hiatal hernia.  Lungs/Pleura: There are new patchy areas of peribronchovascular airspace consolidation in the lungs bilaterally, most significantly in the posterior aspect of the left lower lobe, with an additional focus in the  posterior lateral aspect of the right upper lobe. Findings suggest a multilobar bronchopneumonia. Linear areas of scarring are also noted in the lower lobes of the lungs bilaterally. No pleural effusions.  Upper Abdomen: Small amount of pneumobilia (similar to the prior study), presumably related to prior sphincterotomy. Low attenuation throughout the hepatic parenchyma, compatible with hepatic steatosis.  Musculoskeletal: There are no aggressive appearing lytic or blastic lesions noted in the visualized portions of the skeleton.  Review of the MIP images confirms the above findings.  IMPRESSION: 1. No evidence of pulmonary embolism. 2. However, there is multilobar bronchopneumonia in the left lower and right upper lobes, as above. Followup PA and lateral chest radiographs are recommended in 2-3 weeks after appropriate trial of antimicrobial therapy to ensure complete resolution of these findings. 3. Hepatic steatosis.   Electronically Signed   By: Trudie Reed M.D.   On: 03/05/2014 17:56    Microbiology: Recent Results (from the past 240 hour(s))  RAPID STREP SCREEN     Status: None   Collection Time    03/05/14  3:33 PM      Result Value Ref Range Status   Streptococcus, Group A Screen (Direct) NEGATIVE  NEGATIVE Final   Comment: (NOTE)     A Rapid Antigen test may result negative if the antigen level in the     sample is below the detection level of this test. The FDA has not     cleared this test as a stand-alone test therefore the rapid antigen     negative result has reflexed to a Group A Strep culture.  CULTURE, GROUP A STREP     Status: None   Collection Time    03/05/14  3:33 PM  Result Value Ref Range Status   Specimen Description THROAT   Final   Special Requests NONE   Final   Culture     Final   Value: No Beta Hemolytic Streptococci Isolated     Performed at [facility]   Report Status 03/07/2014 FINAL   Final  CULTURE, BLOOD (ROUTINE X 2)     Status: None    Collection Time    03/05/14  3:49 PM      Result Value Ref Range Status   Specimen Description BLOOD LEFT WRIST   Final   Special Requests BOTTLES DRAWN AEROBIC AND ANAEROBIC 5CC   Final   Culture  Setup Time     Final   Value: 03/05/2014 22:49     Performed at [facility]   Culture     Final   Value:        BLOOD CULTURE RECEIVED NO GROWTH TO DATE CULTURE WILL BE HELD FOR 5 DAYS BEFORE ISSUING A FINAL NEGATIVE REPORT     Performed at [facility]   Report Status PENDING   Incomplete  CULTURE, BLOOD (ROUTINE X 2)     Status: None   Collection Time    03/05/14  3:50 PM      Result Value Ref Range Status   Specimen Description BLOOD RIGHT ANTECUBITAL   Final   Special Requests BOTTLES DRAWN AEROBIC AND ANAEROBIC 5CC   Final   Culture  Setup Time     Final   Value: 03/05/2014 22:49     Performed at [facility]   Culture     Final   Value:        BLOOD CULTURE RECEIVED NO GROWTH TO DATE CULTURE WILL BE HELD FOR 5 DAYS BEFORE ISSUING A FINAL NEGATIVE REPORT     Performed at [facility]   Report Status PENDING   Incomplete  URINE CULTURE     Status: None   Collection Time    03/05/14  4:32 PM      Result Value Ref Range Status   Specimen Description URINE, CLEAN CATCH   Final   Special Requests NONE   Final   Culture  Setup Time     Final   Value: 03/05/2014 22:54     Performed at [facility] Count     Final   Value: 5,000 COLONIES/ML     Performed at [facility]   Culture     Final   Value: INSIGNIFICANT GROWTH     Performed at [facility]   Report Status 03/07/2014 FINAL   Final  MRSA PCR SCREENING     Status: None   Collection Time    03/05/14  9:02 PM      Result Value Ref Range Status   MRSA by PCR NEGATIVE  NEGATIVE Final   Comment:            The GeneXpert MRSA Assay (FDA     approved for NASAL specimens     only), is one component of a     comprehensive MRSA colonization     surveillance  program. It is not     intended to diagnose MRSA     infection nor to guide or     monitor treatment for     MRSA infections.     Labs: Basic Metabolic Panel:  Recent Labs Lab 03/05/14 1519 03/06/14 1610 03/07/14 0325 03/08/14 0011  NA 141 142 145 143  K 3.7 3.7 3.1* 3.9  CL 103 109 107 105  CO2 26 21 24 22   GLUCOSE 134* 180* 140* 102*  BUN 7 7 9 7   CREATININE 0.70 0.55 0.79 0.72  CALCIUM 9.6 9.0 9.0 9.3   Liver Function Tests:  Recent Labs Lab 03/05/14 1519  AST 30  ALT 35  ALKPHOS 124*  BILITOT 0.4  PROT 7.0  ALBUMIN 3.9   No results found for this basename: LIPASE, AMYLASE,  in the last 168 hours No results found for this basename: AMMONIA,  in the last 168 hours CBC:  Recent Labs Lab 03/05/14 1519 03/06/14 0853 03/07/14 0325 03/08/14 0011  WBC 10.3 14.7* 13.1* 9.5  NEUTROABS 8.7*  --   --   --   HGB 12.3 10.4* 10.0* 10.2*  HCT 36.4 31.8* 30.7* 30.0*  MCV 91.9 91.1 92.5 90.1  PLT 195 172 173 211   Cardiac Enzymes:  Recent Labs Lab 03/05/14 1519 03/05/14 2315 03/06/14 0413 03/08/14 0011  TROPONINI <0.30 <0.30 <0.30 <0.30   BNP: BNP (last 3 results) No results found for this basename: PROBNP,  in the last 8760 hours CBG: No results found for this basename: GLUCAP,  in the last 168 hours     Signed:  VIYUOH,ADELINE C  [facility] 03/08/2014, 3:11 PM

## 2014-03-08 NOTE — Care Management Note (Signed)
    Page 1 of 1   03/08/2014     6:22:04 PM CARE MANAGEMENT NOTE 03/08/2014  Patient:  Holly Rogers,Holly Rogers   Account Number:  [id number]  Date Initiated:  03/06/2014  Documentation initiated by:  Junius CreamerWELL,DEBBIE  Subjective/Objective Assessment:   adm w sepsis, CAP     Action/Plan:   lives w fam, pcp dr r toberg   Anticipated DC Date:  03/08/2014   Anticipated DC Plan:  HOME/SELF CARE      DC Planning Services  CM consult      Choice offered to / List presented to:             Status of service:  Completed, signed off Medicare Important Message given?  YES (If response is "NO", the following Medicare IM given date fields will be blank) Date Medicare IM given:  03/07/2014 Date Additional Medicare IM given:    Discharge Disposition:  HOME/SELF CARE  Per UR Regulation:  Reviewed for med. necessity/level of care/duration of stay  If discussed at Long Length of Stay Meetings, dates discussed:    Comments:  03/07/2014 1220 NCM spoke to pt and she lives at home with boyfriend. She is able to get her medications. Waiting final dc plan. NCM will continue to follow for dc needs. Isidoro DonningAlesia Shavis RN CCM Case Mgmt phone [phone]

## 2014-03-08 NOTE — Progress Notes (Signed)
Nsg Discharge Note  Admit Date:  03/05/2014 Discharge date: 03/08/2014   Holly MuscaRobin Rogers to be D/C'd Home per MD order.  AVS completed.  Copy for chart, and copy for patient signed, and dated. Patient/caregiver able to verbalize understanding.  Discharge Medication:   Medication List    STOP taking these medications       aspirin-acetaminophen-caffeine 250-250-65 MG per tablet  Commonly known as:  EXCEDRIN MIGRAINE     ibuprofen 200 MG tablet  Commonly known as:  ADVIL,MOTRIN     OVER THE COUNTER MEDICATION     OVER THE COUNTER MEDICATION      TAKE these medications       alprazolam 2 MG tablet  Commonly known as:  XANAX  Take 2 mg by mouth 2 (two) times daily as needed for sleep or anxiety.     amoxicillin-clavulanate 875-125 MG per tablet  Commonly known as:  AUGMENTIN  Take 1 tablet by mouth 2 (two) times daily.     cetirizine 10 MG tablet  Commonly known as:  ZYRTEC  Take 10 mg by mouth daily.     cyclobenzaprine 5 MG tablet  Commonly known as:  FLEXERIL  Take 5 mg by mouth 3 (three) times daily as needed for muscle spasms.     diclofenac sodium 1 % Gel  Commonly known as:  VOLTAREN  Apply 1 application topically 3 (three) times daily as needed (for pain).     fexofenadine 180 MG tablet  Commonly known as:  ALLEGRA  Take 180 mg by mouth daily.     HYDROcodone-acetaminophen 5-325 MG per tablet  Commonly known as:  NORCO/VICODIN  Take 2 tablets by mouth every 6 (six) hours as needed for moderate pain.     hyoscyamine 0.125 MG Tbdp disintergrating tablet  Commonly known as:  ANASPAZ  Place 0.125 mg under the tongue every 4 (four) hours as needed for bladder spasms.     levalbuterol 1.25 MG/3ML nebulizer solution  Commonly known as:  XOPENEX  Take 1.25 mg by nebulization every 4 (four) hours as needed for wheezing.     levalbuterol 45 MCG/ACT inhaler  Commonly known as:  XOPENEX HFA  Inhale 2 puffs into the lungs every 4 (four) hours as needed for wheezing.      montelukast 10 MG tablet  Commonly known as:  SINGULAIR  Take 10 mg by mouth at bedtime.     NASACORT ALLERGY 24HR 55 MCG/ACT Aero nasal inhaler  Generic drug:  triamcinolone  Place 2 sprays into the nose daily as needed (for stuffy nose).     omeprazole 20 MG capsule  Commonly known as:  PRILOSEC  Take 20 mg by mouth daily.     ondansetron 4 MG tablet  Commonly known as:  ZOFRAN  Take 4 mg by mouth every 8 (eight) hours as needed for nausea or vomiting.     polyethylene glycol packet  Commonly known as:  MIRALAX / GLYCOLAX  Take 17 g by mouth daily as needed for moderate constipation.     potassium chloride SA 20 MEQ tablet  Commonly known as:  K-DUR,KLOR-CON  Take 20 mEq by mouth daily.     predniSONE 20 MG tablet  Commonly known as:  DELTASONE  Take 2 tablets (40 mg total) by mouth daily with breakfast.     PROAIR HFA 108 (90 BASE) MCG/ACT inhaler  Generic drug:  albuterol  Inhale 2 puffs into the lungs every 6 (six) hours as needed for wheezing or  shortness of breath.     PROBIOTIC DAILY PO  Take 1-2 tablets by mouth daily.     promethazine 12.5 MG tablet  Commonly known as:  PHENERGAN  Take 1-2 tablets (12.5-25 mg total) by mouth every 6 (six) hours as needed for nausea or vomiting.     triamcinolone cream 0.1 %  Commonly known as:  KENALOG  Apply 1 application topically daily as needed (for dry skin).     Vitamin D (Ergocalciferol) 50000 UNITS Caps capsule  Commonly known as:  DRISDOL  Take 50,000 Units by mouth every 7 (seven) days.        Discharge Assessment: Filed Vitals:   03/08/14 1416  BP: 147/92  Pulse: 69  Temp: 98.1 F (36.7 C)  Resp: 18   Skin clean, dry and intact without evidence of skin break down, no evidence of skin tears noted. IV catheter discontinued intact. Site without signs and symptoms of complications - no redness or edema noted at insertion site, patient denies c/o pain - only slight tenderness at site.  Dressing with  slight pressure applied.  D/c Instructions-Education: Discharge instructions given to patient/family with verbalized understanding. D/c education completed with patient/family including follow up instructions, medication list, d/c activities limitations if indicated, with other d/c instructions as indicated by MD - patient able to verbalize understanding, all questions fully answered. Patient instructed to return to ED, call 911, or call MD for any changes in condition.  Patient escorted via WC, and D/C home via private auto.  Kern ReapBrumagin, Katie L, RN 03/08/2014 5:33 PM

## 2014-03-09 NOTE — Progress Notes (Signed)
Chaplain responded to patient's request for an AD. However, patient did not remember making a request. Patient reported that her son was on his to the hospital for drug related reasons and was concerned, although not surprised. Patient shared many of the challenges she has faced with her family, children, and recent relationship. Patient seems to speak as one who is passive, although she had a very vivid memory, again of her challenges in life. Patient reports not feeling secure in her current relationship and seems to feel unsafe. Patient requested prayer, saying that it was the only thing that could help her. Patient also reports her desire to find a [employer], where she can find a more positive support system. Recommend follow-up.

## 2014-03-11 LAB — CULTURE, BLOOD (ROUTINE X 2)
CULTURE: NO GROWTH
Culture: NO GROWTH

## 2014-03-17 ENCOUNTER — Emergency Department (HOSPITAL_BASED_OUTPATIENT_CLINIC_OR_DEPARTMENT_OTHER): Payer: Medicare Other

## 2014-03-17 ENCOUNTER — Emergency Department (HOSPITAL_BASED_OUTPATIENT_CLINIC_OR_DEPARTMENT_OTHER)
Admission: EM | Admit: 2014-03-17 | Discharge: 2014-03-17 | Disposition: A | Discharge status: Home / Self Care | Payer: Medicare Other | Attending: Emergency Medicine | Admitting: Emergency Medicine

## 2014-03-17 ENCOUNTER — Encounter (HOSPITAL_BASED_OUTPATIENT_CLINIC_OR_DEPARTMENT_OTHER): Payer: Self-pay | Admitting: Emergency Medicine

## 2014-03-17 DIAGNOSIS — R143 Flatulence: Secondary | ICD-10-CM | POA: Diagnosis not present

## 2014-03-17 DIAGNOSIS — I059 Rheumatic mitral valve disease, unspecified: Secondary | ICD-10-CM | POA: Insufficient documentation

## 2014-03-17 DIAGNOSIS — R142 Eructation: Secondary | ICD-10-CM

## 2014-03-17 DIAGNOSIS — K219 Gastro-esophageal reflux disease without esophagitis: Secondary | ICD-10-CM | POA: Diagnosis not present

## 2014-03-17 DIAGNOSIS — R51 Headache: Secondary | ICD-10-CM | POA: Diagnosis present

## 2014-03-17 DIAGNOSIS — Z8619 Personal history of other infectious and parasitic diseases: Secondary | ICD-10-CM | POA: Insufficient documentation

## 2014-03-17 DIAGNOSIS — J4489 Other specified chronic obstructive pulmonary disease: Secondary | ICD-10-CM | POA: Insufficient documentation

## 2014-03-17 DIAGNOSIS — R071 Chest pain on breathing: Secondary | ICD-10-CM | POA: Insufficient documentation

## 2014-03-17 DIAGNOSIS — J449 Chronic obstructive pulmonary disease, unspecified: Secondary | ICD-10-CM | POA: Insufficient documentation

## 2014-03-17 DIAGNOSIS — Z79899 Other long term (current) drug therapy: Secondary | ICD-10-CM | POA: Insufficient documentation

## 2014-03-17 DIAGNOSIS — Z792 Long term (current) use of antibiotics: Secondary | ICD-10-CM | POA: Diagnosis not present

## 2014-03-17 DIAGNOSIS — Z8701 Personal history of pneumonia (recurrent): Secondary | ICD-10-CM | POA: Insufficient documentation

## 2014-03-17 DIAGNOSIS — J029 Acute pharyngitis, unspecified: Secondary | ICD-10-CM | POA: Insufficient documentation

## 2014-03-17 DIAGNOSIS — R141 Gas pain: Secondary | ICD-10-CM | POA: Insufficient documentation

## 2014-03-17 DIAGNOSIS — Z880 Allergy status to penicillin: Secondary | ICD-10-CM | POA: Diagnosis not present

## 2014-03-17 DIAGNOSIS — B372 Candidiasis of skin and nail: Secondary | ICD-10-CM | POA: Diagnosis not present

## 2014-03-17 DIAGNOSIS — Z8739 Personal history of other diseases of the musculoskeletal system and connective tissue: Secondary | ICD-10-CM | POA: Insufficient documentation

## 2014-03-17 DIAGNOSIS — R0789 Other chest pain: Secondary | ICD-10-CM

## 2014-03-17 DIAGNOSIS — R519 Headache, unspecified: Secondary | ICD-10-CM

## 2014-03-17 HISTORY — DX: Sepsis, unspecified organism: A41.9

## 2014-03-17 HISTORY — DX: Pneumonia, unspecified organism: J18.9

## 2014-03-17 LAB — CBC WITH DIFFERENTIAL/PLATELET
BASOS ABS: 0 10*3/uL (ref 0.0–0.1)
Basophils Relative: 0 % (ref 0–1)
Eosinophils Absolute: 0.1 10*3/uL (ref 0.0–0.7)
Eosinophils Relative: 2 % (ref 0–5)
HCT: 41.1 % (ref 36.0–46.0)
Hemoglobin: 13.6 g/dL (ref 12.0–15.0)
LYMPHS PCT: 43 % (ref 12–46)
Lymphs Abs: 2.7 10*3/uL (ref 0.7–4.0)
MCH: 30.9 pg (ref 26.0–34.0)
MCHC: 33.1 g/dL (ref 30.0–36.0)
MCV: 93.4 fL (ref 78.0–100.0)
Monocytes Absolute: 0.8 10*3/uL (ref 0.1–1.0)
Monocytes Relative: 13 % — ABNORMAL HIGH (ref 3–12)
NEUTROS ABS: 2.6 10*3/uL (ref 1.7–7.7)
NEUTROS PCT: 42 % — AB (ref 43–77)
PLATELETS: 278 10*3/uL (ref 150–400)
RBC: 4.4 MIL/uL (ref 3.87–5.11)
RDW: 12.4 % (ref 11.5–15.5)
WBC: 6.2 10*3/uL (ref 4.0–10.5)

## 2014-03-17 LAB — URINALYSIS, ROUTINE W REFLEX MICROSCOPIC
Bilirubin Urine: NEGATIVE
GLUCOSE, UA: NEGATIVE mg/dL
Hgb urine dipstick: NEGATIVE
Ketones, ur: NEGATIVE mg/dL
NITRITE: NEGATIVE
PH: 7 (ref 5.0–8.0)
Protein, ur: NEGATIVE mg/dL
Specific Gravity, Urine: 1.007 (ref 1.005–1.030)
Urobilinogen, UA: 0.2 mg/dL (ref 0.0–1.0)

## 2014-03-17 LAB — COMPREHENSIVE METABOLIC PANEL
ALBUMIN: 3.9 g/dL (ref 3.5–5.2)
ALT: 42 U/L — ABNORMAL HIGH (ref 0–35)
AST: 37 U/L (ref 0–37)
Alkaline Phosphatase: 119 U/L — ABNORMAL HIGH (ref 39–117)
Anion gap: 16 — ABNORMAL HIGH (ref 5–15)
BUN: 9 mg/dL (ref 6–23)
CALCIUM: 9.6 mg/dL (ref 8.4–10.5)
CHLORIDE: 102 meq/L (ref 96–112)
CO2: 22 meq/L (ref 19–32)
Creatinine, Ser: 0.7 mg/dL (ref 0.50–1.10)
GFR calc Af Amer: >90 mL/min (ref >90)
GFR calc non Af Amer: >90 mL/min (ref >90)
Glucose, Bld: 88 mg/dL (ref 70–99)
Potassium: 4.6 mEq/L (ref 3.7–5.3)
SODIUM: 140 meq/L (ref 137–147)
Total Protein: 7 g/dL (ref 6.0–8.3)

## 2014-03-17 LAB — URINE MICROSCOPIC-ADD ON

## 2014-03-17 LAB — TROPONIN I: Troponin I: <0.3 ng/mL (ref <0.30)

## 2014-03-17 LAB — I-STAT CG4 LACTIC ACID, ED: Lactic Acid, Venous: 1.31 mmol/L (ref 0.5–2.2)

## 2014-03-17 LAB — LIPASE, BLOOD: Lipase: 44 U/L (ref 11–59)

## 2014-03-17 MED ORDER — SODIUM CHLORIDE 0.9 % IV BOLUS (SEPSIS)
1000.0000 mL | Freq: Once | INTRAVENOUS | Status: AC
  Administered 2014-03-17: 1000 mL via INTRAVENOUS

## 2014-03-17 MED ORDER — SODIUM CHLORIDE 0.9 % IV BOLUS (SEPSIS): 30.0000 mL/kg | Freq: Once | INTRAVENOUS | Status: DC

## 2014-03-17 MED ORDER — SODIUM CHLORIDE 0.9 % IV SOLN: 1000.0000 mL | INTRAVENOUS | Status: DC

## 2014-03-17 MED ORDER — NYSTATIN 100000 UNIT/GM EX CREA: TOPICAL_CREAM | CUTANEOUS | Status: DC

## 2014-03-17 MED ORDER — KETAMINE HCL 10 MG/ML IJ SOLN
15.0000 mg | Freq: Once | INTRAMUSCULAR | Status: AC
  Administered 2014-03-17: 15 mg via INTRAVENOUS
  Filled 2014-03-17: qty 1

## 2014-03-17 MED ORDER — PROMETHAZINE HCL 25 MG/ML IJ SOLN
25.0000 mg | Freq: Once | INTRAMUSCULAR | Status: AC
  Administered 2014-03-17: 25 mg via INTRAVENOUS
  Filled 2014-03-17: qty 1

## 2014-03-17 NOTE — Discharge Instructions (Signed)
Chest Wall Pain Chest wall pain is pain in or around the bones and muscles of your chest. It may take up to 6 weeks to get better. It may take longer if you must stay physically active in your work and activities.  CAUSES  Chest wall pain may happen on its own. However, it may be caused by:  A viral illness like the flu.  Injury.  Coughing.  Exercise.  Arthritis.  Fibromyalgia.  Shingles. HOME CARE INSTRUCTIONS   Avoid overtiring physical activity. Try not to strain or perform activities that cause pain. This includes any activities using your chest or your abdominal and side muscles, especially if heavy weights are used.  Put ice on the sore area.  Put ice in a plastic bag.  Place a towel between your skin and the bag.  Leave the ice on for 15-20 minutes per hour while awake for the first 2 days.  Only take over-the-counter or prescription medicines for pain, discomfort, or fever as directed by your caregiver. SEEK IMMEDIATE MEDICAL CARE IF:   Your pain increases, or you are very uncomfortable.  You have a fever.  Your chest pain becomes worse.  You have new, unexplained symptoms.  You have nausea or vomiting.  You feel sweaty or lightheaded.  You have a cough with phlegm (sputum), or you cough up blood. MAKE SURE YOU:   Understand these instructions.  Will watch your condition.  Will get help right away if you are not doing well or get worse. Document Released: 09/01/2005 Document Revised: 11/24/2011 Document Reviewed: 04/28/2011 [employer] Patient Information 2015 [place], [place]. This information is not intended to replace advice given to you by your health care provider. Make sure you discuss any questions you have with your health care provider.  Candida Infection, Adult A candida infection (also called yeast, fungus and Monilia infection) is an overgrowth of yeast that can occur anywhere on the body. A yeast infection commonly occurs in warm, moist body  areas. Usually, the infection remains localized but can spread to become a systemic infection. A yeast infection may be a sign of a more severe disease such as diabetes, leukemia, or AIDS. A yeast infection can occur in both men and women. In women, Candida vaginitis is a vaginal infection. It is one of the most common causes of vaginitis. Men usually do not have symptoms or know they have an infection until other problems develop. Men may find out they have a yeast infection because their sex partner has a yeast infection. Uncircumcised men are more likely to get a yeast infection than circumcised men. This is because the uncircumcised glans is not exposed to air and does not remain as dry as that of a circumcised glans. Older adults may develop yeast infections around dentures. CAUSES  Women  Antibiotics.  Steroid medication taken for a long time.  Being overweight (obese).  Diabetes.  Poor immune condition.  Certain serious medical conditions.  Immune suppressive medications for organ transplant patients.  Chemotherapy.  Pregnancy.  Menstration.  Stress and fatigue.  Intravenous drug use.  Oral contraceptives.  Wearing tight-fitting clothes in the crotch area.  Catching it from a sex partner who has a yeast infection.  Spermicide.  Intravenous, urinary, or other catheters. Men  Catching it from a sex partner who has a yeast infection.  Having oral or anal sex with a person who has the infection.  Spermicide.  Diabetes.  Antibiotics.  Poor immune system.  Medications that suppress the immune  system.  Intravenous drug use.  Intravenous, urinary, or other catheters. SYMPTOMS  Women  Thick, white vaginal discharge.  Vaginal itching.  Redness and swelling in and around the vagina.  Irritation of the lips of the vagina and perineum.  Blisters on the vaginal lips and perineum.  Painful sexual intercourse.  Low blood sugar (hypoglycemia).  Painful  urination.  Bladder infections.  Intestinal problems such as constipation, indigestion, bad breath, bloating, increase in gas, diarrhea, or loose stools. Men  Men may develop intestinal problems such as constipation, indigestion, bad breath, bloating, increase in gas, diarrhea, or loose stools.  Dry, cracked skin on the penis with itching or discomfort.  Jock itch.  Dry, flaky skin.  Athlete's foot.  Hypoglycemia. DIAGNOSIS  Women  A history and an exam are performed.  The discharge may be examined under a microscope.  A culture may be taken of the discharge. Men  A history and an exam are performed.  Any discharge from the penis or areas of cracked skin will be looked at under the microscope and cultured.  Stool samples may be cultured. TREATMENT  Women  Vaginal antifungal suppositories and creams.  Medicated creams to decrease irritation and itching on the outside of the vagina.  Warm compresses to the perineal area to decrease swelling and discomfort.  Oral antifungal medications.  Medicated vaginal suppositories or cream for repeated or recurrent infections.  Wash and dry the irritation areas before applying the cream.  Eating yogurt with lactobacillus may help with prevention and treatment.  Sometimes painting the vagina with gentian violet solution may help if creams and suppositories do not work. Men  Antifungal creams and oral antifungal medications.  Sometimes treatment must continue for 30 days after the symptoms go away to prevent recurrence. HOME CARE INSTRUCTIONS  Women  Use cotton underwear and avoid tight-fitting clothing.  Avoid colored, scented toilet paper and deodorant tampons or pads.  Do not douche.  Keep your diabetes under control.  Finish all the prescribed medications.  Keep your skin clean and dry.  Consume milk or yogurt with lactobacillus active culture regularly. If you get frequent yeast infections and think that is  what the infection is, there are over-the-counter medications that you can get. If the infection does not show healing in 3 days, talk to your caregiver.  Tell your sex partner you have a yeast infection. Your partner may need treatment also, especially if your infection does not clear up or recurs. Men  Keep your skin clean and dry.  Keep your diabetes under control.  Finish all prescribed medications.  Tell your sex partner that you have a yeast infection so they can be treated if necessary. SEEK MEDICAL CARE IF:   Your symptoms do not clear up or worsen in one week after treatment.  You have an oral temperature above 102 F (38.9 C).  You have trouble swallowing or eating for a prolonged time.  You develop blisters on and around your vagina.  You develop vaginal bleeding and it is not your menstrual period.  You develop abdominal pain.  You develop intestinal problems as mentioned above.  You get weak or lightheaded.  You have painful or increased urination.  You have pain during sexual intercourse. MAKE SURE YOU:   Understand these instructions.  Will watch your condition.  Will get help right away if you are not doing well or get worse. Document Released: 10/09/2004 Document Revised: 11/24/2011 Document Reviewed: 01/21/2010 [facility] Patient Information 2015 [place], [place]. This  information is not intended to replace advice given to you by your health care provider. Make sure you discuss any questions you have with your health care provider. Diarrhea Diarrhea is frequent loose and watery bowel movements. It can cause you to feel weak and dehydrated. Dehydration can cause you to become tired and thirsty, have a dry mouth, and have decreased urination that often is dark yellow. Diarrhea is a sign of another problem, most often an infection that will not last long. In most cases, diarrhea typically lasts 2-3 days. However, it can last longer if it is a sign of something  more serious. It is important to treat your diarrhea as directed by your caregive to lessen or prevent future episodes of diarrhea. CAUSES  Some common causes include:  Gastrointestinal infections caused by viruses, bacteria, or parasites.  Food poisoning or food allergies.  Certain medicines, such as antibiotics, chemotherapy, and laxatives.  Artificial sweeteners and fructose.  Digestive disorders. HOME CARE INSTRUCTIONS  Ensure adequate fluid intake (hydration): have 1 cup (8 oz) of fluid for each diarrhea episode. Avoid fluids that contain simple sugars or sports drinks, fruit juices, whole milk products, and sodas. Your urine should be clear or pale yellow if you are drinking enough fluids. Hydrate with an oral rehydration solution that you can purchase at pharmacies, retail stores, and online. You can prepare an oral rehydration solution at home by mixing the following ingredients together:   - tsp table salt.   tsp baking soda.   tsp salt substitute containing potassium chloride.  1  tablespoons sugar.  1 L (34 oz) of water.  Certain foods and beverages may increase the speed at which food moves through the gastrointestinal (GI) tract. These foods and beverages should be avoided and include:  Caffeinated and alcoholic beverages.  High-fiber foods, such as raw fruits and vegetables, nuts, seeds, and whole grain breads and cereals.  Foods and beverages sweetened with sugar alcohols, such as xylitol, sorbitol, and mannitol.  Some foods may be well tolerated and may help thicken stool including:  Starchy foods, such as rice, toast, pasta, low-sugar cereal, oatmeal, grits, baked potatoes, crackers, and bagels.  Bananas.  Applesauce.  Add probiotic-rich foods to help increase healthy bacteria in the GI tract, such as yogurt and fermented milk products.  Wash your hands well after each diarrhea episode.  Only take over-the-counter or prescription medicines as directed  by your caregiver.  Take a warm bath to relieve any burning or pain from frequent diarrhea episodes. SEEK IMMEDIATE MEDICAL CARE IF:   You are unable to keep fluids down.  You have persistent vomiting.  You have blood in your stool, or your stools are black and tarry.  You do not urinate in 6-8 hours, or there is only a small amount of very dark urine.  You have abdominal pain that increases or localizes.  You have weakness, dizziness, confusion, or lightheadedness.  You have a severe headache.  Your diarrhea gets worse or does not get better.  You have a fever or persistent symptoms for more than 2-3 days.  You have a fever and your symptoms suddenly get worse. MAKE SURE YOU:   Understand these instructions.  Will watch your condition.  Will get help right away if you are not doing well or get worse. Document Released: 08/22/2002 Document Revised: 08/18/2012 Document Reviewed: 05/09/2012 [facility] Patient Information 2015 [place], [place]. This information is not intended to replace advice given to you by your health care provider. Make  sure you discuss any questions you have with your health care provider. Abdominal Pain Many things can cause abdominal pain. Usually, abdominal pain is not caused by a disease and will improve without treatment. It can often be observed and treated at home. Your health care provider will do a physical exam and possibly order blood tests and X-rays to help determine the seriousness of your pain. However, in many cases, more time must pass before a clear cause of the pain can be found. Before that point, your health care provider may not know if you need more testing or further treatment. HOME CARE INSTRUCTIONS  Monitor your abdominal pain for any changes. The following actions may help to alleviate any discomfort you are experiencing:  Only take over-the-counter or prescription medicines as directed by your health care provider.  Do not take  laxatives unless directed to do so by your health care provider.  Try a clear liquid diet (broth, tea, or water) as directed by your health care provider. Slowly move to a bland diet as tolerated. SEEK MEDICAL CARE IF:  You have unexplained abdominal pain.  You have abdominal pain associated with nausea or diarrhea.  You have pain when you urinate or have a bowel movement.  You experience abdominal pain that wakes you in the night.  You have abdominal pain that is worsened or improved by eating food.  You have abdominal pain that is worsened with eating fatty foods.  You have a fever. SEEK IMMEDIATE MEDICAL CARE IF:   Your pain does not go away within 2 hours.  You keep throwing up (vomiting).  Your pain is felt only in portions of the abdomen, such as the right side or the left lower portion of the abdomen.  You pass bloody or black tarry stools. MAKE SURE YOU:  Understand these instructions.   Will watch your condition.   Will get help right away if you are not doing well or get worse.  Document Released: 06/11/2005 Document Revised: 09/06/2013 Document Reviewed: 05/11/2013 [facility] Patient Information 2015 [place], [place]. This information is not intended to replace advice given to you by your health care provider. Make sure you discuss any questions you have with your health care provider.

## 2014-03-17 NOTE — ED Provider Notes (Signed)
CSN: [id number]     Arrival date & time 03/17/14  0259 History   First MD Initiated Contact with Patient 03/17/14 0319     Chief Complaint  Patient presents with  . Headache     (Consider location/radiation/quality/duration/timing/severity/associated sxs/prior Treatment) HPI This is a 53 year old female who presents tonight with multiple complaints. She was recently admitted to [facility] with sepsis and was discharged June 24. She has completed her antibiotics and steroids. She states that during her hospitalization she began having intermittent abdominal pain with nausea but no vomiting.  This has continued since hospitalization, discharge, and until today. She feels like she is retaining fluid and has gained 12 pounds of weight. She also states that she has chest pain which is located in her bilateral rib cages and is worse with deep breathing and coughing. She feels like her pneumonia may have returned. She also states that she has a headache that began an unknown time. She describes it as diffuse and severe. She has been eating and drinking but states that whenever she that she has diarrhea. She states that she has had C. difficile multiple times after she has been on antibiotics. She also complains of a rash in the perirectal area. She denies fever, chills, or productive cough. Past Medical History  Diagnosis Date  . Sarcoidosis   . COPD (chronic obstructive pulmonary disease)   . Arthritis   . Acid reflux   . Mitral valve prolapse   . Diverticulosis   . Pancreatitis 1997  . Sepsis   . Pneumonia    Past Surgical History  Procedure Laterality Date  . Cholecystectomy     No family history on file. History  Substance Use Topics  . Smoking status: Passive Smoke Exposure - Never Smoker  . Smokeless tobacco: Never Used  . Alcohol Use: No   OB History   Grav Para Term Preterm Abortions TAB SAB Ect Mult Living                 Review of Systems  HENT: Positive for sore  throat.   Cardiovascular: Positive for chest pain.  Gastrointestinal: Positive for abdominal distention.  All other systems reviewed and are negative.     Allergies  Amoxicillin; Avandia; Baclofen; Ceftin; Celebrex; Ciprofloxacin; Clindamycin/lincomycin; Doxycycline; Flagyl; Levaquin; Macrobid; Neurontin; Other; Sulfa antibiotics; Symbicort; Ultram; Vancomycin; Vioxx; Voltaren; and Tape  Home Medications   Prior to Admission medications   Medication Sig Start Date End Date Taking? Authorizing Provider  albuterol (PROAIR HFA) 108 (90 BASE) MCG/ACT inhaler Inhale 2 puffs into the lungs every 6 (six) hours as needed for wheezing or shortness of breath.    Historical Provider, MD  alprazolam Duanne Moron) 2 MG tablet Take 2 mg by mouth 2 (two) times daily as needed for sleep or anxiety.     Historical Provider, MD  amoxicillin-clavulanate (AUGMENTIN) 875-125 MG per tablet Take 1 tablet by mouth 2 (two) times daily. 03/08/14   Sheila Oats, MD  cetirizine (ZYRTEC) 10 MG tablet Take 10 mg by mouth daily.    Historical Provider, MD  cyclobenzaprine (FLEXERIL) 5 MG tablet Take 5 mg by mouth 3 (three) times daily as needed for muscle spasms.    Historical Provider, MD  diclofenac sodium (VOLTAREN) 1 % GEL Apply 1 application topically 3 (three) times daily as needed (for pain).  01/18/14   Historical Provider, MD  fexofenadine (ALLEGRA) 180 MG tablet Take 180 mg by mouth daily.    Historical Provider, MD  HYDROcodone-acetaminophen (NORCO/VICODIN) 5-325 MG per tablet Take 2 tablets by mouth every 6 (six) hours as needed for moderate pain.     Historical Provider, MD  hyoscyamine (ANASPAZ) 0.125 MG TBDP disintergrating tablet Place 0.125 mg under the tongue every 4 (four) hours as needed for bladder spasms.    Historical Provider, MD  levalbuterol [facility] HFA) 45 MCG/ACT inhaler Inhale 2 puffs into the lungs every 4 (four) hours as needed for wheezing.    Historical Provider, MD  levalbuterol Penne Lash)  1.25 MG/3ML nebulizer solution Take 1.25 mg by nebulization every 4 (four) hours as needed for wheezing.    Historical Provider, MD  montelukast (SINGULAIR) 10 MG tablet Take 10 mg by mouth at bedtime. 12/02/13   Historical Provider, MD  omeprazole (PRILOSEC) 20 MG capsule Take 20 mg by mouth daily.    Historical Provider, MD  ondansetron (ZOFRAN) 4 MG tablet Take 4 mg by mouth every 8 (eight) hours as needed for nausea or vomiting.    Historical Provider, MD  polyethylene glycol (MIRALAX / GLYCOLAX) packet Take 17 g by mouth daily as needed for moderate constipation.    Historical Provider, MD  potassium chloride SA (K-DUR,KLOR-CON) 20 MEQ tablet Take 20 mEq by mouth daily. 01/17/14   Historical Provider, MD  predniSONE (DELTASONE) 20 MG tablet Take 2 tablets (40 mg total) by mouth daily with breakfast. 03/08/14   Sheila Oats, MD  Probiotic Product (PROBIOTIC DAILY PO) Take 1-2 tablets by mouth daily.    Historical Provider, MD  promethazine (PHENERGAN) 12.5 MG tablet Take 1-2 tablets (12.5-25 mg total) by mouth every 6 (six) hours as needed for nausea or vomiting. 03/08/14   Sheila Oats, MD  triamcinolone (NASACORT ALLERGY 24HR) 55 MCG/ACT AERO nasal inhaler Place 2 sprays into the nose daily as needed (for stuffy nose).     Historical Provider, MD  triamcinolone cream (KENALOG) 0.1 % Apply 1 application topically daily as needed (for dry skin).  08/17/13   Historical Provider, MD  Vitamin D, Ergocalciferol, (DRISDOL) 50000 UNITS CAPS capsule Take 50,000 Units by mouth every 7 (seven) days.    Historical Provider, MD   BP 100/73  Pulse 103  Temp(Src) 97.9 F (36.6 C) (Oral)  Resp 18  Ht _0  (1.651 m)  Wt 182 lb (82.555 kg)  BMI 30.29 kg/m2  SpO2 100% Physical Exam  Nursing note and vitals reviewed. Constitutional: She is oriented to person, place, and time. She appears well-developed and well-nourished.  HENT:  Head: Normocephalic and atraumatic.  Right Ear: External ear normal.    Left Ear: External ear normal.  Nose: Nose normal.  Mouth/Throat: Oropharynx is clear and moist.  Eyes: Conjunctivae and EOM are normal. Pupils are equal, round, and reactive to light.  Neck: Normal range of motion. Neck supple.  Cardiovascular: Normal rate, regular rhythm, normal heart sounds and intact distal pulses.   Pulmonary/Chest: Effort normal and breath sounds normal. She exhibits tenderness.    Abdominal: Soft. Bowel sounds are normal. She exhibits distension. She exhibits no mass. There is tenderness. There is no rebound and no guarding.  Mild diffuse tenderness palpation  Musculoskeletal: Normal range of motion. She exhibits no edema.  Neurological: She is alert and oriented to person, place, and time.  Skin: Skin is warm and dry. There is erythema.       ED Course  Procedures (including critical care time) Labs Review Labs Reviewed  CBC WITH DIFFERENTIAL - Abnormal; Notable for the following:    Neutrophils  Relative % 42 (*)    Monocytes Relative 13 (*)    All other components within normal limits  URINE CULTURE  COMPREHENSIVE METABOLIC PANEL  URINALYSIS, ROUTINE W REFLEX MICROSCOPIC  LIPASE, BLOOD  TROPONIN I  I-STAT CG4 LACTIC ACID, ED    Imaging Review No results found.   EKG Interpretation   Date/Time:  Friday March 17 2014 04:24:50 EDT Ventricular Rate:  80 PR Interval:  164 QRS Duration: 84 QT Interval:  386 QTC Calculation: 445 R Axis:   69 Text Interpretation:  Normal sinus rhythm Normal ECG Confirmed by Tahisha Hakim MD,  Ritisha Deitrick ([id number]) on 03/17/2014 4:31:57 AM      MDM   Final diagnoses:  Chest wall pain  Nonintractable episodic headache, unspecified headache type  Candidal intertrigo    1- chest pain- pain appears on exam and history to be from chest wall.  EKG was obtained and is normal.  CXR shows no rib fracture or other etiology of pain. 2- headache- patient with recent hosptialization for sepsis and now with headache.  No s/s of  meningitis with supple neck, no fever or leukocytosis.  No recent trauma, onset not c.w. Sah. 3- abdominal pain- mild diffuse ttp and elevated alk phos cw. Prior labs.  Ow labs normal. Patient without emesis and does not appear dehydrated or to have surgical source of pain.  She states she has had diarrhea and c.dif. Could contribute to diffuse pain but patient has not stooled here. 4-infection/pneumonia- cxr reveals interval resolution of prior changes. Repeat labs reveal no elevated lactate or wbc. 5- rash- consistent with candida likely secondary to recent abx use- plan nystatin.   Patient with multiple pain complaints that do not appear to have distinct medical etiology.  Patient treated here for pain and nausea.  She is given rx for antiemetic and nystatin cream     Shaune Pollack, MD 03/17/14 [phone]

## 2014-03-17 NOTE — ED Notes (Signed)
Pt c/o ha, bilateral side pain, cp, sweaty, chills  Rash on bottom x 3 days

## 2014-03-17 NOTE — ED Notes (Signed)
C/o ha,bilateral side pain, sweaty, chills cp and rash on bottom x 3 days

## 2014-03-18 LAB — URINE CULTURE
COLONY COUNT: NO GROWTH
CULTURE: NO GROWTH

## 2014-03-23 ENCOUNTER — Encounter (HOSPITAL_BASED_OUTPATIENT_CLINIC_OR_DEPARTMENT_OTHER): Payer: Self-pay | Admitting: Emergency Medicine

## 2014-03-23 ENCOUNTER — Emergency Department (HOSPITAL_BASED_OUTPATIENT_CLINIC_OR_DEPARTMENT_OTHER)
Admission: EM | Admit: 2014-03-23 | Discharge: 2014-03-23 | Disposition: A | Discharge status: Home / Self Care | Payer: Medicare Other | Attending: Emergency Medicine | Admitting: Emergency Medicine

## 2014-03-23 DIAGNOSIS — R197 Diarrhea, unspecified: Secondary | ICD-10-CM | POA: Diagnosis present

## 2014-03-23 DIAGNOSIS — M129 Arthropathy, unspecified: Secondary | ICD-10-CM | POA: Diagnosis not present

## 2014-03-23 DIAGNOSIS — J449 Chronic obstructive pulmonary disease, unspecified: Secondary | ICD-10-CM | POA: Diagnosis not present

## 2014-03-23 DIAGNOSIS — Z8619 Personal history of other infectious and parasitic diseases: Secondary | ICD-10-CM | POA: Insufficient documentation

## 2014-03-23 DIAGNOSIS — R51 Headache: Secondary | ICD-10-CM | POA: Insufficient documentation

## 2014-03-23 DIAGNOSIS — Z8701 Personal history of pneumonia (recurrent): Secondary | ICD-10-CM | POA: Insufficient documentation

## 2014-03-23 DIAGNOSIS — G8929 Other chronic pain: Secondary | ICD-10-CM | POA: Insufficient documentation

## 2014-03-23 DIAGNOSIS — Z8679 Personal history of other diseases of the circulatory system: Secondary | ICD-10-CM | POA: Insufficient documentation

## 2014-03-23 DIAGNOSIS — Z792 Long term (current) use of antibiotics: Secondary | ICD-10-CM | POA: Diagnosis not present

## 2014-03-23 DIAGNOSIS — Z880 Allergy status to penicillin: Secondary | ICD-10-CM | POA: Insufficient documentation

## 2014-03-23 DIAGNOSIS — K219 Gastro-esophageal reflux disease without esophagitis: Secondary | ICD-10-CM | POA: Insufficient documentation

## 2014-03-23 DIAGNOSIS — IMO0002 Reserved for concepts with insufficient information to code with codable children: Secondary | ICD-10-CM | POA: Diagnosis not present

## 2014-03-23 DIAGNOSIS — R519 Headache, unspecified: Secondary | ICD-10-CM

## 2014-03-23 DIAGNOSIS — J4489 Other specified chronic obstructive pulmonary disease: Secondary | ICD-10-CM | POA: Insufficient documentation

## 2014-03-23 DIAGNOSIS — Z791 Long term (current) use of non-steroidal anti-inflammatories (NSAID): Secondary | ICD-10-CM | POA: Diagnosis not present

## 2014-03-23 MED ORDER — HYDROCODONE-ACETAMINOPHEN 5-325 MG PO TABS: 2.0000 | ORAL_TABLET | ORAL | Status: DC | PRN

## 2014-03-23 MED ORDER — KETOROLAC TROMETHAMINE 30 MG/ML IJ SOLN
30.0000 mg | Freq: Once | INTRAMUSCULAR | Status: AC
  Administered 2014-03-23: 30 mg via INTRAMUSCULAR
  Filled 2014-03-23: qty 1

## 2014-03-23 MED ORDER — KETOROLAC TROMETHAMINE 30 MG/ML IJ SOLN: 30.0000 mg | Freq: Once | INTRAMUSCULAR | Status: DC

## 2014-03-23 MED ORDER — ONDANSETRON 8 MG PO TBDP: ORAL_TABLET | ORAL | Status: DC

## 2014-03-23 MED ORDER — METRONIDAZOLE 500 MG PO TABS: 500.0000 mg | ORAL_TABLET | Freq: Two times a day (BID) | ORAL | Status: DC

## 2014-03-23 NOTE — ED Provider Notes (Signed)
CSN: [id number]     Arrival date & time 03/23/14  1825 History  This chart was scribed for Hurman Horn, MD by Chestine Spore, ED Scribe. The patient was seen in room MH01/MH01 at 8:32 PM.    Chief Complaint  Patient presents with  . Diarrhea  . Migraine   The history is provided by the patient. No language interpreter was used.   HPI Comments: Holly Rogers is a 53 y.o. female who presents to the Emergency Department complaining of diarrhea and migraine onset 03/05/14. She states that since she left the hospital from admission from pneumonia and sepsis on 03/05/14 she has had symptoms of C.diff.  She states that she had the HA and diarrhea when she left the hospital. She states that she was seen at [facility] for diarrhea as well. She states that she saw her allergist today. She states that she saw her PCP this week. She states that she has several episodes of diarrhea today.  She states that since her admission to the hospital she has had these symptoms.  She states that she has a global gradual onset constant diffused squeezing HA since her hospital visit. She state that she has multiple non-bloody diarrhea. She states that she has constant abdominal pain that is worsened when she eats. She states that she has not taken anything for her HA. She states that she has had HA like this in the past.  She denies fever, AMS, change in speech or vision, lateralizing weakness or numbness on one side of the body. She states that she is on chronic pain medications. She forgot to ask her PCP for a refill on her chronic narcotics and wants a temporary refill on her medications. She states that she thinks that the flagyl gave her nausea, but not really allergic.   She states that she does not remember what she has been given for her C. Diff in the past. She states that she has a medical history of C. Diff. She states that she has a GI doctor. She states that she has a h/o C.Diff. She states that she has an allergy to  Vacomycin.   Past Medical History  Diagnosis Date  . Sarcoidosis   . COPD (chronic obstructive pulmonary disease)   . Arthritis   . Acid reflux   . Mitral valve prolapse   . Diverticulosis   . Pancreatitis 1997  . Sepsis   . Pneumonia   . Sepsis    Past Surgical History  Procedure Laterality Date  . Cholecystectomy     No family history on file. History  Substance Use Topics  . Smoking status: Passive Smoke Exposure - Never Smoker  . Smokeless tobacco: Never Used  . Alcohol Use: No   OB History   Grav Para Term Preterm Abortions TAB SAB Ect Mult Living                 Review of Systems  Constitutional: Negative for fever.  Gastrointestinal: Positive for diarrhea.  Neurological: Positive for headaches. Negative for weakness and numbness.   10 Systems reviewed and are negative for acute change except as noted in the HPI.   Allergies  Amoxicillin; Avandia; Baclofen; Ceftin; Celebrex; Ciprofloxacin; Clindamycin/lincomycin; Doxycycline; Flagyl; Levaquin; Macrobid; Neurontin; Other; Sulfa antibiotics; Symbicort; Ultram; Vancomycin; Vioxx; Voltaren; and Tape  Home Medications   Prior to Admission medications   Medication Sig Start Date End Date Taking? Authorizing Provider  albuterol (PROAIR HFA) 108 (90 BASE) MCG/ACT inhaler Inhale 2  puffs into the lungs every 6 (six) hours as needed for wheezing or shortness of breath.    Historical Provider, MD  alprazolam Prudy Feeler) 2 MG tablet Take 2 mg by mouth 2 (two) times daily as needed for sleep or anxiety.     Historical Provider, MD  amoxicillin-clavulanate (AUGMENTIN) 875-125 MG per tablet Take 1 tablet by mouth 2 (two) times daily. 03/08/14   Kela Millin, MD  cetirizine (ZYRTEC) 10 MG tablet Take 10 mg by mouth daily.    Historical Provider, MD  cyclobenzaprine (FLEXERIL) 5 MG tablet Take 5 mg by mouth 3 (three) times daily as needed for muscle spasms.    Historical Provider, MD  diclofenac sodium (VOLTAREN) 1 % GEL Apply 1  application topically 3 (three) times daily as needed (for pain).  01/18/14   Historical Provider, MD  fexofenadine (ALLEGRA) 180 MG tablet Take 180 mg by mouth daily.    Historical Provider, MD  HYDROcodone-acetaminophen (NORCO) 5-325 MG per tablet Take 2 tablets by mouth every 4 (four) hours as needed. 03/23/14   Hurman Horn, MD  HYDROcodone-acetaminophen (NORCO/VICODIN) 5-325 MG per tablet Take 2 tablets by mouth every 6 (six) hours as needed for moderate pain.     Historical Provider, MD  hyoscyamine (ANASPAZ) 0.125 MG TBDP disintergrating tablet Place 0.125 mg under the tongue every 4 (four) hours as needed for bladder spasms.    Historical Provider, MD  levalbuterol [facility] HFA) 45 MCG/ACT inhaler Inhale 2 puffs into the lungs every 4 (four) hours as needed for wheezing.    Historical Provider, MD  levalbuterol Pauline Aus) 1.25 MG/3ML nebulizer solution Take 1.25 mg by nebulization every 4 (four) hours as needed for wheezing.    Historical Provider, MD  metroNIDAZOLE (FLAGYL) 500 MG tablet Take 1 tablet (500 mg total) by mouth 2 (two) times daily. One po bid x 7 days 03/23/14   Hurman Horn, MD  montelukast (SINGULAIR) 10 MG tablet Take 10 mg by mouth at bedtime. 12/02/13   Historical Provider, MD  nystatin cream (MYCOSTATIN) Apply to affected area 2 times daily 03/17/14   Hilario Quarry, MD  omeprazole (PRILOSEC) 20 MG capsule Take 20 mg by mouth daily.    Historical Provider, MD  ondansetron (ZOFRAN ODT) 8 MG disintegrating tablet 8mg  ODT q4 hours prn nausea 03/23/14   Hurman Horn, MD  ondansetron (ZOFRAN) 4 MG tablet Take 4 mg by mouth every 8 (eight) hours as needed for nausea or vomiting.    Historical Provider, MD  polyethylene glycol (MIRALAX / GLYCOLAX) packet Take 17 g by mouth daily as needed for moderate constipation.    Historical Provider, MD  potassium chloride SA (K-DUR,KLOR-CON) 20 MEQ tablet Take 20 mEq by mouth daily. 01/17/14   Historical Provider, MD  predniSONE (DELTASONE) 20 MG  tablet Take 2 tablets (40 mg total) by mouth daily with breakfast. 03/08/14   Kela Millin, MD  Probiotic Product (PROBIOTIC DAILY PO) Take 1-2 tablets by mouth daily.    Historical Provider, MD  promethazine (PHENERGAN) 12.5 MG tablet Take 1-2 tablets (12.5-25 mg total) by mouth every 6 (six) hours as needed for nausea or vomiting. 03/08/14   Kela Millin, MD  triamcinolone (NASACORT ALLERGY 24HR) 55 MCG/ACT AERO nasal inhaler Place 2 sprays into the nose daily as needed (for stuffy nose).     Historical Provider, MD  triamcinolone cream (KENALOG) 0.1 % Apply 1 application topically daily as needed (for dry skin).  08/17/13   Historical Provider,  MD  Vitamin D, Ergocalciferol, (DRISDOL) 50000 UNITS CAPS capsule Take 50,000 Units by mouth every 7 (seven) days.    Historical Provider, MD   BP 109/76  Pulse 89  Resp 16  SpO2 99% Physical Exam  Nursing note and vitals reviewed. Constitutional:  Awake, alert, nontoxic appearance with baseline speech for patient.  HENT:  Head: Atraumatic.  Mouth/Throat: No oropharyngeal exudate.  Eyes: EOM are normal. Pupils are equal, round, and reactive to light. Right eye exhibits no discharge. Left eye exhibits no discharge.  Neck: Neck supple.  Cardiovascular: Normal rate and regular rhythm.   No murmur heard. Pulmonary/Chest: Effort normal and breath sounds normal. No stridor. No respiratory distress. She has no wheezes. She has no rales. She exhibits no tenderness.  Abdominal: Soft. Bowel sounds are normal. She exhibits no mass. There is no tenderness. There is no rebound.  Musculoskeletal: She exhibits no tenderness.  Baseline ROM, moves extremities with no obvious new focal weakness.  Lymphadenopathy:    She has no cervical adenopathy.  Neurological:  Awake, alert, cooperative and aware of situation; motor strength bilaterally; sensation normal to light touch bilaterally; peripheral visual fields full to confrontation; no facial asymmetry;  tongue midline; major cranial nerves appear intact; no pronator drift, normal finger to nose bilaterally, baseline gait without new ataxia.  Skin: No rash noted.  Psychiatric: She has a normal mood and affect.    ED Course  Procedures (including critical care time) DIAGNOSTIC STUDIES: Oxygen Saturation is 99% on room air, normal by my interpretation.    COORDINATION OF CARE: 8:44 PM-Discussed treatment plan which includes labs with pt at bedside and pt agreed to plan.   Patient informed of clinical course, understand medical decision-making process, and agree with plan.  Labs Review Labs Reviewed - No data to display  Imaging Review No results found.   EKG Interpretation None      MDM   Final diagnoses:  Diarrhea  Headache, unspecified headache type  Chronic pain     I doubt any other EMC precluding discharge at this time including, but not necessarily limited to the following:SAH, CVA, sepsis. Cannot r/o C. Diff so will order Flagyl.  I personally performed the services described in this documentation, which was scribed in my presence. The recorded information has been reviewed and is accurate.    Hurman HornJohn M Helina Hullum, MD 03/24/14 [phone]

## 2014-03-23 NOTE — ED Notes (Signed)
Pt states since leaving hosptial from admission from sepsis on 03-05-14 has had symptoms of c.diff. And severe headache.

## 2014-03-23 NOTE — ED Notes (Addendum)
Pt states she was seen by PCP yesterday-states she forgot to ask for pain med and that PCP was not concerned with pt c/o diarrhea-pt also states that EDP advised he wouold order toradol injection

## 2014-03-23 NOTE — ED Notes (Signed)
Pt states she is unable to give stool sample at this time-EDP was notified-orders to d/c cdiff order and to have pt f/u with her PCP

## 2014-03-23 NOTE — Discharge Instructions (Signed)
You are having a headache. No specific cause was found today for your headache. It may have been a migraine or other cause of headache. Stress, anxiety, fatigue, and depression are common triggers for headaches. Your headache today does not appear to be life-threatening or require hospitalization, but often the exact cause of headaches is not determined in the emergency department. Therefore, follow-up with your doctor is very important to find out what may have caused your headache, and whether or not you need any further diagnostic testing or treatment. Sometimes headaches can appear benign (not harmful), but then more serious symptoms can develop which should prompt an immediate re-evaluation by your doctor or the emergency department. °SEEK MEDICAL ATTENTION IF: °You develop possible problems with medications prescribed.  °The medications don't resolve your headache, if it recurs , or if you have multiple episodes of vomiting or can't take fluids. °You have a change from the usual headache. °RETURN IMMEDIATELY IF you develop a sudden, severe headache or confusion, become poorly responsive or faint, develop a fever above 100.4F or problem breathing, have a change in speech, vision, swallowing, or understanding, or develop new weakness, numbness, tingling, incoordination, or have a seizure. ° °Chronic Pain Discharge Instructions  °Emergency care providers appreciate that many patients coming to us are in severe pain and we wish to address their pain in the safest, most responsible manner.  It is important to recognize however, that the proper treatment of chronic pain differs from that of the pain of injuries and acute illnesses.  Our goal is to provide quality, safe, personalized care and we thank you for giving us the opportunity to serve you. °The use of narcotics and related agents for chronic pain syndromes may lead to additional physical and psychological problems.  Nearly as many people die from  prescription narcotics each year as die from car crashes.  Additionally, this risk is increased if such prescriptions are obtained from a variety of sources.  Therefore, only your primary care physician or a pain management specialist is able to safely treat such syndromes with narcotic medications long-term.   ° °Documentation revealing such prescriptions have been sought from multiple sources may prohibit us from providing a refill or different narcotic medication.  Your name may be checked first through the [place] Controlled Substances Reporting System.  This database is a record of controlled substance medication prescriptions that the patient has received.  This has been established by [place] in an effort to eliminate the dangerous, and often life threatening, practice of obtaining multiple prescriptions from different medical providers.  ° °If you have a chronic pain syndrome (i.e. chronic headaches, recurrent back or neck pain, dental pain, abdominal or pelvis pain without a specific diagnosis, or neuropathic pain such as fibromyalgia) or recurrent visits for the same condition without an acute diagnosis, you may be treated with non-narcotics and other non-addictive medicines.  Allergic reactions or negative side effects that may be reported by a patient to such medications will not typically lead to the use of a narcotic analgesic or other controlled substance as an alternative. °  °Patients managing chronic pain with a personal physician should have provisions in place for breakthrough pain.  If you are in crisis, you should call your physician.  If your physician directs you to the emergency department, please have the doctor call and speak to our attending physician concerning your care. °  °When patients come to the Emergency Department (ED) with acute medical conditions in which the   Emergency Department physician feels appropriate to prescribe narcotic or sedating pain medication, the  physician will prescribe these in very limited quantities.  The amount of these medications will last only until you can see your primary care physician in his/her office.  Any patient who returns to the ED seeking refills should expect only non-narcotic pain medications.  ° °In the event of an acute medical condition exists and the emergency physician feels it is necessary that the patient be given a narcotic or sedating medication -  a responsible adult driver should be present in the room prior to the medication being given by the nurse. °  °Prescriptions for narcotic or sedating medications that have been lost, stolen or expired will not be refilled in the Emergency Department.   ° °Patients who have chronic pain may receive non-narcotic prescriptions until seen by their primary care physician.  It is every patient’s personal responsibility to maintain active prescriptions with his or her primary care physician or specialist. °

## 2014-04-12 ENCOUNTER — Emergency Department (HOSPITAL_BASED_OUTPATIENT_CLINIC_OR_DEPARTMENT_OTHER)
Admission: EM | Admit: 2014-04-12 | Discharge: 2014-04-13 | Disposition: A | Discharge status: Home / Self Care | Payer: Medicare Other | Attending: Emergency Medicine | Admitting: Emergency Medicine

## 2014-04-12 ENCOUNTER — Encounter (HOSPITAL_BASED_OUTPATIENT_CLINIC_OR_DEPARTMENT_OTHER): Payer: Self-pay | Admitting: Emergency Medicine

## 2014-04-12 DIAGNOSIS — K219 Gastro-esophageal reflux disease without esophagitis: Secondary | ICD-10-CM | POA: Insufficient documentation

## 2014-04-12 DIAGNOSIS — Z792 Long term (current) use of antibiotics: Secondary | ICD-10-CM | POA: Insufficient documentation

## 2014-04-12 DIAGNOSIS — Z79899 Other long term (current) drug therapy: Secondary | ICD-10-CM | POA: Insufficient documentation

## 2014-04-12 DIAGNOSIS — Z791 Long term (current) use of non-steroidal anti-inflammatories (NSAID): Secondary | ICD-10-CM | POA: Diagnosis not present

## 2014-04-12 DIAGNOSIS — D869 Sarcoidosis, unspecified: Secondary | ICD-10-CM | POA: Diagnosis not present

## 2014-04-12 DIAGNOSIS — E119 Type 2 diabetes mellitus without complications: Secondary | ICD-10-CM | POA: Diagnosis not present

## 2014-04-12 DIAGNOSIS — Z8679 Personal history of other diseases of the circulatory system: Secondary | ICD-10-CM | POA: Insufficient documentation

## 2014-04-12 DIAGNOSIS — J029 Acute pharyngitis, unspecified: Secondary | ICD-10-CM | POA: Diagnosis not present

## 2014-04-12 DIAGNOSIS — J449 Chronic obstructive pulmonary disease, unspecified: Secondary | ICD-10-CM | POA: Diagnosis not present

## 2014-04-12 DIAGNOSIS — Z880 Allergy status to penicillin: Secondary | ICD-10-CM | POA: Diagnosis not present

## 2014-04-12 DIAGNOSIS — J4489 Other specified chronic obstructive pulmonary disease: Secondary | ICD-10-CM | POA: Insufficient documentation

## 2014-04-12 DIAGNOSIS — M129 Arthropathy, unspecified: Secondary | ICD-10-CM | POA: Insufficient documentation

## 2014-04-12 DIAGNOSIS — Z8701 Personal history of pneumonia (recurrent): Secondary | ICD-10-CM | POA: Diagnosis not present

## 2014-04-12 HISTORY — DX: Type 2 diabetes mellitus without complications: E11.9

## 2014-04-12 LAB — RAPID STREP SCREEN (MED CTR MEBANE ONLY): Streptococcus, Group A Screen (Direct): NEGATIVE

## 2014-04-12 NOTE — ED Notes (Signed)
C/o sore throat x 2 days.

## 2014-04-12 NOTE — ED Provider Notes (Signed)
CSN: [id number]     Arrival date & time 04/12/14  2217 History   None    This chart was scribed for Hanley Seamen, MD by Arlan Organ, ED Scribe. This patient was seen in room MH06/MH06 and the patient's care was started 12:03 AM.   Chief Complaint  Patient presents with  . Sore Throat   HPI  HPI Comments: Holly Rogers is a 53 y.o. female with a PMHx of sarcoidosis, COPD, mitral valve prolapse, and DM who presents to the Emergency Department complaining of a constant, moderate sore throat x 2 days that is progressively worsening. Pt also reports hoarse voice, swollen lymph nodes in her neck, chills, diarrhea and pain that radiates down her neck along with bilaterally shoulder pain. She mentions mild SOB that she attributes to being outdoors recently. Pt was diagnosed and hospitalized  1 month ago for pneumonia and sepsis. Pain is worse with swallowing, and she had noted tonsillar exudate. She reports abdominal pain onset 1 days she describes as sharp in the RUQ. At this time she denies any fever or CP. She is not currently on steroids. Pt with multiple allergies. She has already been seen by two of her physicians earlier this week.  Past Medical History  Diagnosis Date  . Sarcoidosis   . COPD (chronic obstructive pulmonary disease)   . Arthritis   . Acid reflux   . Mitral valve prolapse   . Diverticulosis   . Pancreatitis 1997  . Sepsis   . Pneumonia   . Sepsis   . Diabetes mellitus without complication    Past Surgical History  Procedure Laterality Date  . Cholecystectomy    . Abdominal hysterectomy     No family history on file. History  Substance Use Topics  . Smoking status: Passive Smoke Exposure - Never Smoker  . Smokeless tobacco: Never Used  . Alcohol Use: No   OB History   Grav Para Term Preterm Abortions TAB SAB Ect Mult Living                 Review of Systems  All other systems reviewed and are negative.   Allergies  Amoxicillin; Avandia; Baclofen; Ceftin;  Celebrex; Ciprofloxacin; Clindamycin/lincomycin; Doxycycline; Flagyl; Levaquin; Macrobid; Neurontin; Other; Sulfa antibiotics; Symbicort; Ultram; Vancomycin; Vioxx; Voltaren; and Tape  Home Medications   Prior to Admission medications   Medication Sig Start Date End Date Taking? Authorizing Provider  albuterol (PROAIR HFA) 108 (90 BASE) MCG/ACT inhaler Inhale 2 puffs into the lungs every 6 (six) hours as needed for wheezing or shortness of breath.    Historical Provider, MD  alprazolam Prudy Feeler) 2 MG tablet Take 2 mg by mouth 2 (two) times daily as needed for sleep or anxiety.     Historical Provider, MD  amoxicillin-clavulanate (AUGMENTIN) 875-125 MG per tablet Take 1 tablet by mouth 2 (two) times daily. 03/08/14   Kela Millin, MD  cetirizine (ZYRTEC) 10 MG tablet Take 10 mg by mouth daily.    Historical Provider, MD  cyclobenzaprine (FLEXERIL) 5 MG tablet Take 5 mg by mouth 3 (three) times daily as needed for muscle spasms.    Historical Provider, MD  diclofenac sodium (VOLTAREN) 1 % GEL Apply 1 application topically 3 (three) times daily as needed (for pain).  01/18/14   Historical Provider, MD  fexofenadine (ALLEGRA) 180 MG tablet Take 180 mg by mouth daily.    Historical Provider, MD  HYDROcodone-acetaminophen (NORCO) 5-325 MG per tablet Take 2 tablets by mouth every  4 (four) hours as needed. 03/23/14   Hurman Horn, MD  HYDROcodone-acetaminophen (NORCO/VICODIN) 5-325 MG per tablet Take 2 tablets by mouth every 6 (six) hours as needed for moderate pain.     Historical Provider, MD  hyoscyamine (ANASPAZ) 0.125 MG TBDP disintergrating tablet Place 0.125 mg under the tongue every 4 (four) hours as needed for bladder spasms.    Historical Provider, MD  levalbuterol [facility] HFA) 45 MCG/ACT inhaler Inhale 2 puffs into the lungs every 4 (four) hours as needed for wheezing.    Historical Provider, MD  levalbuterol Pauline Aus) 1.25 MG/3ML nebulizer solution Take 1.25 mg by nebulization every 4 (four)  hours as needed for wheezing.    Historical Provider, MD  metroNIDAZOLE (FLAGYL) 500 MG tablet Take 1 tablet (500 mg total) by mouth 2 (two) times daily. One po bid x 7 days 03/23/14   Hurman Horn, MD  montelukast (SINGULAIR) 10 MG tablet Take 10 mg by mouth at bedtime. 12/02/13   Historical Provider, MD  nystatin cream (MYCOSTATIN) Apply to affected area 2 times daily 03/17/14   Hilario Quarry, MD  omeprazole (PRILOSEC) 20 MG capsule Take 20 mg by mouth daily.    Historical Provider, MD  ondansetron (ZOFRAN ODT) 8 MG disintegrating tablet 8mg  ODT q4 hours prn nausea 03/23/14   Hurman Horn, MD  ondansetron (ZOFRAN) 4 MG tablet Take 4 mg by mouth every 8 (eight) hours as needed for nausea or vomiting.    Historical Provider, MD  polyethylene glycol (MIRALAX / GLYCOLAX) packet Take 17 g by mouth daily as needed for moderate constipation.    Historical Provider, MD  potassium chloride SA (K-DUR,KLOR-CON) 20 MEQ tablet Take 20 mEq by mouth daily. 01/17/14   Historical Provider, MD  predniSONE (DELTASONE) 20 MG tablet Take 2 tablets (40 mg total) by mouth daily with breakfast. 03/08/14   Kela Millin, MD  Probiotic Product (PROBIOTIC DAILY PO) Take 1-2 tablets by mouth daily.    Historical Provider, MD  promethazine (PHENERGAN) 12.5 MG tablet Take 1-2 tablets (12.5-25 mg total) by mouth every 6 (six) hours as needed for nausea or vomiting. 03/08/14   Kela Millin, MD  triamcinolone (NASACORT ALLERGY 24HR) 55 MCG/ACT AERO nasal inhaler Place 2 sprays into the nose daily as needed (for stuffy nose).     Historical Provider, MD  triamcinolone cream (KENALOG) 0.1 % Apply 1 application topically daily as needed (for dry skin).  08/17/13   Historical Provider, MD  Vitamin D, Ergocalciferol, (DRISDOL) 50000 UNITS CAPS capsule Take 50,000 Units by mouth every 7 (seven) days.    Historical Provider, MD   Triage Vitals: BP 112/94  Pulse 100  Temp(Src) 98.3 F (36.8 C) (Oral)  Resp 18  Ht 5\' 5"  (1.651 m)  Wt  170 lb (77.111 kg)  BMI 28.29 kg/m2  SpO2 100%   Physical Exam  General: Well-developed, well-nourished female in no acute distress; non-toxic appearing; appearance consistent with age of record HENT: normocephalic; atraumatic; mild tonsillar exudate noted; dysphonia noted Eyes: pupils equal, round and reactive to light; extraocular muscles intact Neck: supple; No meningeal signs; anterior and posterior cervical lymphadenopathy  Heart: regular rate and rhythm; no murmurs, rubs or gallops Lungs: clear to auscultation bilaterally Abdomen: soft; nondistended; RUQ, LLQ, LUQ tenderness; no masses or hepatosplenomegaly; bowel sounds present Extremities: No deformity; full range of motion; pulses normal Neurologic: Awake, alert and oriented; motor function intact in all extremities and symmetric; no facial droop Skin: Warm and dry Psychiatric:  Normal mood and affect   ED Course  Procedures (including critical care time)  DIAGNOSTIC STUDIES: Oxygen Saturation is 100% on RA, Normal by my interpretation.    COORDINATION OF CARE: 12:07 AM-Discussed treatment plan with pt at bedside and pt agreed to plan.      MDM   Nursing notes and vitals signs, including pulse oximetry, reviewed.  Summary of this visit's results, reviewed by myself:  Labs:  Results for orders placed during the hospital encounter of 04/12/14 (from the past 24 hour(s))  RAPID STREP SCREEN     Status: None   Collection Time    04/12/14 10:25 PM      Result Value Ref Range   Streptococcus, Group A Screen (Direct) NEGATIVE  NEGATIVE  COMPREHENSIVE METABOLIC PANEL     Status: Abnormal   Collection Time    04/13/14 12:09 AM      Result Value Ref Range   Sodium 141  137 - 147 mEq/L   Potassium 3.8  3.7 - 5.3 mEq/L   Chloride 104  96 - 112 mEq/L   CO2 24  19 - 32 mEq/L   Glucose, Bld 108 (*) 70 - 99 mg/dL   BUN 6  6 - 23 mg/dL   Creatinine, Ser 1.61  0.50 - 1.10 mg/dL   Calcium 9.5  8.4 - 09.6 mg/dL   Total Protein  7.2  6.0 - 8.3 g/dL   Albumin 3.8  3.5 - 5.2 g/dL   AST 39 (*) 0 - 37 U/L   ALT 43 (*) 0 - 35 U/L   Alkaline Phosphatase 130 (*) 39 - 117 U/L   Total Bilirubin 0.3  0.3 - 1.2 mg/dL   GFR calc non Af Amer >90  >90 mL/min   GFR calc Af Amer >90  >90 mL/min   Anion gap 13  5 - 15  CBC WITH DIFFERENTIAL     Status: Abnormal   Collection Time    04/13/14 12:09 AM      Result Value Ref Range   WBC 8.6  4.0 - 10.5 K/uL   RBC 3.93  3.87 - 5.11 MIL/uL   Hemoglobin 12.3  12.0 - 15.0 g/dL   HCT 04.5  40.9 - 81.1 %   MCV 93.1  78.0 - 100.0 fL   MCH 31.3  26.0 - 34.0 pg   MCHC 33.6  30.0 - 36.0 g/dL   RDW 91.4  78.2 - 95.6 %   Platelets 228  150 - 400 K/uL   Neutrophils Relative % 61  43 - 77 %   Neutro Abs 5.2  1.7 - 7.7 K/uL   Lymphocytes Relative 26  12 - 46 %   Lymphs Abs 2.2  0.7 - 4.0 K/uL   Monocytes Relative 13 (*) 3 - 12 %   Monocytes Absolute 1.1 (*) 0.1 - 1.0 K/uL   Eosinophils Relative 1  0 - 5 %   Eosinophils Absolute 0.1  0.0 - 0.7 K/uL   Basophils Relative 0  0 - 1 %   Basophils Absolute 0.0  0.0 - 0.1 K/uL  MONONUCLEOSIS SCREEN     Status: None   Collection Time    04/13/14 12:09 AM      Result Value Ref Range   Mono Screen NEGATIVE  NEGATIVE  URINALYSIS, ROUTINE W REFLEX MICROSCOPIC     Status: None   Collection Time    04/13/14  1:21 AM      Result Value Ref Range  Color, Urine YELLOW  YELLOW   APPearance CLEAR  CLEAR   Specific Gravity, Urine 1.013  1.005 - 1.030   pH 5.5  5.0 - 8.0   Glucose, UA NEGATIVE  NEGATIVE mg/dL   Hgb urine dipstick NEGATIVE  NEGATIVE   Bilirubin Urine NEGATIVE  NEGATIVE   Ketones, ur NEGATIVE  NEGATIVE mg/dL   Protein, ur NEGATIVE  NEGATIVE mg/dL   Urobilinogen, UA 0.2  0.0 - 1.0 mg/dL   Nitrite NEGATIVE  NEGATIVE   Leukocytes, UA NEGATIVE  NEGATIVE  URINE RAPID DRUG SCREEN (HOSP PERFORMED)     Status: Abnormal   Collection Time    04/13/14  1:21 AM      Result Value Ref Range   Opiates POSITIVE (*) NONE DETECTED   Cocaine  NONE DETECTED  NONE DETECTED   Benzodiazepines POSITIVE (*) NONE DETECTED   Amphetamines NONE DETECTED  NONE DETECTED   Tetrahydrocannabinol NONE DETECTED  NONE DETECTED   Barbiturates NONE DETECTED  NONE DETECTED    Imaging Studies: Dg Chest 2 View  04/13/2014   CLINICAL DATA:  Sore throat for 2 days. Chills, diarrhea, pain radiating down the neck with bilateral shoulder pain. Shortness of breath.  EXAM: CHEST  2 VIEW  COMPARISON:  03/17/2014  FINDINGS: The heart size and mediastinal contours are within normal limits. Both lungs are clear. The visualized skeletal structures are unremarkable.  IMPRESSION: No active cardiopulmonary disease.   Electronically Signed   By: Burman NievesWilliam  Stevens M.D.   On: 04/13/2014 01:14   1:55 AM The patient is showing no evidence of acute bacterial infection or impending sepsis. Her sore throat is likely viral. Strep culture has been sent. She has been diagnosed previously with chronic pain. She is positive for opiates and benzodiazepines. She has been requesting pain medication in the ED but she has prescriptions at home.  I personally performed the services described in this documentation, which was scribed in my presence. The recorded information has been reviewed and is accurate.    Hanley SeamenJohn L Fae Blossom, MD 04/13/14 [phone]

## 2014-04-13 ENCOUNTER — Emergency Department (HOSPITAL_BASED_OUTPATIENT_CLINIC_OR_DEPARTMENT_OTHER): Payer: Medicare Other

## 2014-04-13 DIAGNOSIS — J029 Acute pharyngitis, unspecified: Secondary | ICD-10-CM | POA: Diagnosis not present

## 2014-04-13 LAB — URINALYSIS, ROUTINE W REFLEX MICROSCOPIC
BILIRUBIN URINE: NEGATIVE
GLUCOSE, UA: NEGATIVE mg/dL
HGB URINE DIPSTICK: NEGATIVE
Ketones, ur: NEGATIVE mg/dL
Leukocytes, UA: NEGATIVE
Nitrite: NEGATIVE
Protein, ur: NEGATIVE mg/dL
SPECIFIC GRAVITY, URINE: 1.013 (ref 1.005–1.030)
Urobilinogen, UA: 0.2 mg/dL (ref 0.0–1.0)
pH: 5.5 (ref 5.0–8.0)

## 2014-04-13 LAB — COMPREHENSIVE METABOLIC PANEL
ALT: 43 U/L — ABNORMAL HIGH (ref 0–35)
AST: 39 U/L — ABNORMAL HIGH (ref 0–37)
Albumin: 3.8 g/dL (ref 3.5–5.2)
Alkaline Phosphatase: 130 U/L — ABNORMAL HIGH (ref 39–117)
Anion gap: 13 (ref 5–15)
BILIRUBIN TOTAL: 0.3 mg/dL (ref 0.3–1.2)
BUN: 6 mg/dL (ref 6–23)
CHLORIDE: 104 meq/L (ref 96–112)
CO2: 24 mEq/L (ref 19–32)
Calcium: 9.5 mg/dL (ref 8.4–10.5)
Creatinine, Ser: 0.7 mg/dL (ref 0.50–1.10)
GFR calc Af Amer: >90 mL/min (ref >90)
GFR calc non Af Amer: >90 mL/min (ref >90)
Glucose, Bld: 108 mg/dL — ABNORMAL HIGH (ref 70–99)
POTASSIUM: 3.8 meq/L (ref 3.7–5.3)
SODIUM: 141 meq/L (ref 137–147)
Total Protein: 7.2 g/dL (ref 6.0–8.3)

## 2014-04-13 LAB — CBC WITH DIFFERENTIAL/PLATELET
BASOS ABS: 0 10*3/uL (ref 0.0–0.1)
Basophils Relative: 0 % (ref 0–1)
EOS PCT: 1 % (ref 0–5)
Eosinophils Absolute: 0.1 10*3/uL (ref 0.0–0.7)
HCT: 36.6 % (ref 36.0–46.0)
Hemoglobin: 12.3 g/dL (ref 12.0–15.0)
LYMPHS PCT: 26 % (ref 12–46)
Lymphs Abs: 2.2 10*3/uL (ref 0.7–4.0)
MCH: 31.3 pg (ref 26.0–34.0)
MCHC: 33.6 g/dL (ref 30.0–36.0)
MCV: 93.1 fL (ref 78.0–100.0)
MONO ABS: 1.1 10*3/uL — AB (ref 0.1–1.0)
MONOS PCT: 13 % — AB (ref 3–12)
Neutro Abs: 5.2 10*3/uL (ref 1.7–7.7)
Neutrophils Relative %: 61 % (ref 43–77)
Platelets: 228 10*3/uL (ref 150–400)
RBC: 3.93 MIL/uL (ref 3.87–5.11)
RDW: 12.1 % (ref 11.5–15.5)
WBC: 8.6 10*3/uL (ref 4.0–10.5)

## 2014-04-13 LAB — RAPID URINE DRUG SCREEN, HOSP PERFORMED
AMPHETAMINES: NOT DETECTED
BARBITURATES: NOT DETECTED
BENZODIAZEPINES: POSITIVE — AB
COCAINE: NOT DETECTED
Opiates: POSITIVE — AB
TETRAHYDROCANNABINOL: NOT DETECTED

## 2014-04-13 LAB — MONONUCLEOSIS SCREEN: Mono Screen: NEGATIVE

## 2014-04-13 MED ORDER — SODIUM CHLORIDE 0.9 % IV SOLN
INTRAVENOUS | Status: DC
  Administered 2014-04-13: 01:00:00 via INTRAVENOUS

## 2014-04-13 NOTE — ED Notes (Signed)
MD at bedside. 

## 2014-04-13 NOTE — ED Notes (Signed)
MD notified of pts request for pain med.  No order received at this time.

## 2014-04-14 LAB — CULTURE, GROUP A STREP

## 2014-05-24 ENCOUNTER — Telehealth: Payer: Self-pay | Admitting: Critical Care Medicine

## 2014-05-24 NOTE — Telephone Encounter (Signed)
Holly Rogers called Dr. Temple Pacini office for records since they are listed as referring doctor. The called back because the original referral was made back in march 2015 and since then the pt has established with [facility]. The pt has cancelled and no show a few appts with [place].  They do not want to send records to [place] since the pt has established with another doctor. I was advised to speak with the pt to see if she wants to establish with [place] and if so they will send records. Dr. Temple Pacini office states the pt gets easily confused about her appts. They also state that they were advised seh moved to [facility] so they think she may want to stay there.   I LMTCBx1 on home and LM with friend on cell #. Carron Curie, CMA

## 2014-05-25 ENCOUNTER — Institutional Professional Consult (permissible substitution): Payer: Self-pay | Admitting: Critical Care Medicine

## 2014-05-25 NOTE — Telephone Encounter (Signed)
Pt came to [facility] office at 7:30am asking to reschedule her appt with Dr. Delford Field today. I LMTCB on home phone again this AM, ATC mobile #, no answer. I advised the pt to call [facility] office. See message below. Carron Curie, CMA

## 2014-06-11 ENCOUNTER — Emergency Department (HOSPITAL_BASED_OUTPATIENT_CLINIC_OR_DEPARTMENT_OTHER): Payer: Medicare Other

## 2014-06-11 ENCOUNTER — Encounter (HOSPITAL_BASED_OUTPATIENT_CLINIC_OR_DEPARTMENT_OTHER): Payer: Self-pay | Admitting: Emergency Medicine

## 2014-06-11 ENCOUNTER — Emergency Department (HOSPITAL_BASED_OUTPATIENT_CLINIC_OR_DEPARTMENT_OTHER)
Admission: EM | Admit: 2014-06-11 | Discharge: 2014-06-11 | Disposition: A | Discharge status: Home / Self Care | Payer: Medicare Other | Attending: Emergency Medicine | Admitting: Emergency Medicine

## 2014-06-11 DIAGNOSIS — K219 Gastro-esophageal reflux disease without esophagitis: Secondary | ICD-10-CM | POA: Diagnosis not present

## 2014-06-11 DIAGNOSIS — Z8701 Personal history of pneumonia (recurrent): Secondary | ICD-10-CM | POA: Insufficient documentation

## 2014-06-11 DIAGNOSIS — Z862 Personal history of diseases of the blood and blood-forming organs and certain disorders involving the immune mechanism: Secondary | ICD-10-CM | POA: Diagnosis not present

## 2014-06-11 DIAGNOSIS — R109 Unspecified abdominal pain: Secondary | ICD-10-CM | POA: Insufficient documentation

## 2014-06-11 DIAGNOSIS — K625 Hemorrhage of anus and rectum: Secondary | ICD-10-CM | POA: Insufficient documentation

## 2014-06-11 DIAGNOSIS — K5732 Diverticulitis of large intestine without perforation or abscess without bleeding: Secondary | ICD-10-CM | POA: Diagnosis not present

## 2014-06-11 DIAGNOSIS — IMO0002 Reserved for concepts with insufficient information to code with codable children: Secondary | ICD-10-CM | POA: Insufficient documentation

## 2014-06-11 DIAGNOSIS — M129 Arthropathy, unspecified: Secondary | ICD-10-CM | POA: Insufficient documentation

## 2014-06-11 DIAGNOSIS — J449 Chronic obstructive pulmonary disease, unspecified: Secondary | ICD-10-CM | POA: Diagnosis not present

## 2014-06-11 DIAGNOSIS — Z8619 Personal history of other infectious and parasitic diseases: Secondary | ICD-10-CM | POA: Diagnosis not present

## 2014-06-11 DIAGNOSIS — Z79899 Other long term (current) drug therapy: Secondary | ICD-10-CM | POA: Diagnosis not present

## 2014-06-11 DIAGNOSIS — Z8709 Personal history of other diseases of the respiratory system: Secondary | ICD-10-CM | POA: Diagnosis not present

## 2014-06-11 DIAGNOSIS — E119 Type 2 diabetes mellitus without complications: Secondary | ICD-10-CM | POA: Insufficient documentation

## 2014-06-11 DIAGNOSIS — J4489 Other specified chronic obstructive pulmonary disease: Secondary | ICD-10-CM | POA: Insufficient documentation

## 2014-06-11 DIAGNOSIS — R000 Tachycardia, unspecified: Secondary | ICD-10-CM | POA: Insufficient documentation

## 2014-06-11 DIAGNOSIS — Z90710 Acquired absence of both cervix and uterus: Secondary | ICD-10-CM | POA: Diagnosis not present

## 2014-06-11 DIAGNOSIS — Z9089 Acquired absence of other organs: Secondary | ICD-10-CM | POA: Insufficient documentation

## 2014-06-11 DIAGNOSIS — Z880 Allergy status to penicillin: Secondary | ICD-10-CM | POA: Insufficient documentation

## 2014-06-11 LAB — CBC WITH DIFFERENTIAL/PLATELET
BASOS PCT: 0 % (ref 0–1)
Basophils Absolute: 0 10*3/uL (ref 0.0–0.1)
EOS PCT: 1 % (ref 0–5)
Eosinophils Absolute: 0.1 10*3/uL (ref 0.0–0.7)
HCT: 39.9 % (ref 36.0–46.0)
Hemoglobin: 13.5 g/dL (ref 12.0–15.0)
Lymphocytes Relative: 18 % (ref 12–46)
Lymphs Abs: 1.8 10*3/uL (ref 0.7–4.0)
MCH: 31.2 pg (ref 26.0–34.0)
MCHC: 33.8 g/dL (ref 30.0–36.0)
MCV: 92.1 fL (ref 78.0–100.0)
Monocytes Absolute: 1.1 10*3/uL — ABNORMAL HIGH (ref 0.1–1.0)
Monocytes Relative: 11 % (ref 3–12)
NEUTROS PCT: 70 % (ref 43–77)
Neutro Abs: 7.3 10*3/uL (ref 1.7–7.7)
PLATELETS: 237 10*3/uL (ref 150–400)
RBC: 4.33 MIL/uL (ref 3.87–5.11)
RDW: 12.3 % (ref 11.5–15.5)
WBC: 10.3 10*3/uL (ref 4.0–10.5)

## 2014-06-11 LAB — COMPREHENSIVE METABOLIC PANEL
ALBUMIN: 3.8 g/dL (ref 3.5–5.2)
ALK PHOS: 126 U/L — AB (ref 39–117)
ALT: 28 U/L (ref 0–35)
ANION GAP: 16 — AB (ref 5–15)
AST: 24 U/L (ref 0–37)
BUN: 7 mg/dL (ref 6–23)
CO2: 22 mEq/L (ref 19–32)
Calcium: 10 mg/dL (ref 8.4–10.5)
Chloride: 100 mEq/L (ref 96–112)
Creatinine, Ser: 1 mg/dL (ref 0.50–1.10)
GFR calc non Af Amer: 64 mL/min — ABNORMAL LOW (ref >90)
GFR, EST AFRICAN AMERICAN: 74 mL/min — AB (ref >90)
Glucose, Bld: 141 mg/dL — ABNORMAL HIGH (ref 70–99)
POTASSIUM: 3.8 meq/L (ref 3.7–5.3)
SODIUM: 138 meq/L (ref 137–147)
Total Bilirubin: 0.6 mg/dL (ref 0.3–1.2)
Total Protein: 7.8 g/dL (ref 6.0–8.3)

## 2014-06-11 LAB — RAPID STREP SCREEN (MED CTR MEBANE ONLY): STREPTOCOCCUS, GROUP A SCREEN (DIRECT): NEGATIVE

## 2014-06-11 LAB — URINALYSIS, ROUTINE W REFLEX MICROSCOPIC
BILIRUBIN URINE: NEGATIVE
Glucose, UA: NEGATIVE mg/dL
Hgb urine dipstick: NEGATIVE
KETONES UR: 15 mg/dL — AB
Leukocytes, UA: NEGATIVE
NITRITE: NEGATIVE
PROTEIN: NEGATIVE mg/dL
SPECIFIC GRAVITY, URINE: 1.017 (ref 1.005–1.030)
UROBILINOGEN UA: 0.2 mg/dL (ref 0.0–1.0)
pH: 6.5 (ref 5.0–8.0)

## 2014-06-11 LAB — OCCULT BLOOD X 1 CARD TO LAB, STOOL: FECAL OCCULT BLD: NEGATIVE

## 2014-06-11 LAB — CBG MONITORING, ED: GLUCOSE-CAPILLARY: 110 mg/dL — AB (ref 70–99)

## 2014-06-11 LAB — LIPASE, BLOOD: Lipase: 17 U/L (ref 11–59)

## 2014-06-11 MED ORDER — ONDANSETRON HCL 4 MG/2ML IJ SOLN
4.0000 mg | Freq: Once | INTRAMUSCULAR | Status: AC
  Administered 2014-06-11: 4 mg via INTRAVENOUS
  Filled 2014-06-11: qty 2

## 2014-06-11 MED ORDER — SODIUM CHLORIDE 0.9 % IV BOLUS (SEPSIS)
1000.0000 mL | Freq: Once | INTRAVENOUS | Status: AC
  Administered 2014-06-11: 1000 mL via INTRAVENOUS

## 2014-06-11 MED ORDER — MORPHINE SULFATE 4 MG/ML IJ SOLN
4.0000 mg | Freq: Once | INTRAMUSCULAR | Status: AC
  Administered 2014-06-11: 4 mg via INTRAVENOUS
  Filled 2014-06-11: qty 1

## 2014-06-11 MED ORDER — AMOXICILLIN-POT CLAVULANATE 875-125 MG PO TABS: 1.0000 | ORAL_TABLET | Freq: Two times a day (BID) | ORAL | Status: DC

## 2014-06-11 MED ORDER — IOHEXOL 300 MG/ML  SOLN
100.0000 mL | Freq: Once | INTRAMUSCULAR | Status: AC | PRN
  Administered 2014-06-11: 100 mL via INTRAVENOUS

## 2014-06-11 MED ORDER — IOHEXOL 300 MG/ML  SOLN
50.0000 mL | Freq: Once | INTRAMUSCULAR | Status: AC | PRN
  Administered 2014-06-11: 50 mL via ORAL

## 2014-06-11 NOTE — Discharge Instructions (Signed)
Take Augmentin as directed until gone. Follow up with your primary care provider for further evaluation. Take your oxycodone at home for pain relief.

## 2014-06-11 NOTE — ED Provider Notes (Signed)
CSN: [id number]     Arrival date & time 06/11/14  1518 History   First MD Initiated Contact with Patient 06/11/14 1529     Chief Complaint  Patient presents with  . Abdominal Pain     (Consider location/radiation/quality/duration/timing/severity/associated sxs/prior Treatment) HPI Comments: Patient is a 53 year old female with a past medical history of sarcoidosis, COPD, arthritis, MVP, diabetes, and diverticulosis who presents with abdominal pain for the past year that has acutely worsened since yesterday. The pain is located in her generalized abdomen and does not radiate. The pain is described as cramping and severe. The pain started gradually and progressively worsened since the onset. No alleviating/aggravating factors. The patient has tried nothing for symptoms without relief. Associated symptoms include rectal bleeding for the past year that has also acutely worsened over the past day. Patient reports passing blood per rectum even without the urge to have a bowel movement. Patient reports multiple episodes per day and having to wear depends due to rectal bleeding. Patient denies fever, headache, NVD, chest pain, SOB, dysuria, constipation, abnormal vaginal bleeding/discharge.      Past Medical History  Diagnosis Date  . Sarcoidosis   . COPD (chronic obstructive pulmonary disease)   . Arthritis   . Acid reflux   . Mitral valve prolapse   . Diverticulosis   . Pancreatitis 1997  . Sepsis   . Pneumonia   . Sepsis   . Diabetes mellitus without complication    Past Surgical History  Procedure Laterality Date  . Cholecystectomy    . Abdominal hysterectomy     No family history on file. History  Substance Use Topics  . Smoking status: Passive Smoke Exposure - Never Smoker  . Smokeless tobacco: Never Used  . Alcohol Use: No   OB History   Grav Para Term Preterm Abortions TAB SAB Ect Mult Living                 Review of Systems  Constitutional: Negative for fever, chills  and fatigue.  HENT: Negative for trouble swallowing.   Eyes: Negative for visual disturbance.  Respiratory: Negative for shortness of breath.   Cardiovascular: Negative for chest pain and palpitations.  Gastrointestinal: Positive for abdominal pain and anal bleeding. Negative for nausea, vomiting and diarrhea.  Genitourinary: Negative for dysuria and difficulty urinating.  Musculoskeletal: Negative for arthralgias and neck pain.  Skin: Negative for color change.  Neurological: Negative for dizziness and weakness.  Psychiatric/Behavioral: Negative for dysphoric mood.      Allergies  Amoxicillin; Avandia; Baclofen; Ceftin; Celebrex; Ciprofloxacin; Clindamycin/lincomycin; Doxycycline; Flagyl; Levaquin; Macrobid; Neurontin; Other; Sulfa antibiotics; Symbicort; Ultram; Vancomycin; Vioxx; Voltaren; and Tape  Home Medications   Prior to Admission medications   Medication Sig Start Date End Date Taking? Authorizing Provider  albuterol (PROAIR HFA) 108 (90 BASE) MCG/ACT inhaler Inhale 2 puffs into the lungs every 6 (six) hours as needed for wheezing or shortness of breath.    Historical Provider, MD  alprazolam Prudy Feeler) 2 MG tablet Take 2 mg by mouth 2 (two) times daily as needed for sleep or anxiety.     Historical Provider, MD  cetirizine (ZYRTEC) 10 MG tablet Take 10 mg by mouth daily.    Historical Provider, MD  cyclobenzaprine (FLEXERIL) 5 MG tablet Take 5 mg by mouth 3 (three) times daily as needed for muscle spasms.    Historical Provider, MD  diclofenac sodium (VOLTAREN) 1 % GEL Apply 1 application topically 3 (three) times daily as needed (for pain).  01/18/14   Historical Provider, MD  fexofenadine (ALLEGRA) 180 MG tablet Take 180 mg by mouth daily.    Historical Provider, MD  HYDROcodone-acetaminophen (NORCO) 5-325 MG per tablet Take 2 tablets by mouth every 4 (four) hours as needed. 03/23/14   Hurman Horn, MD  HYDROcodone-acetaminophen (NORCO/VICODIN) 5-325 MG per tablet Take 2 tablets  by mouth every 6 (six) hours as needed for moderate pain.     Historical Provider, MD  hyoscyamine (ANASPAZ) 0.125 MG TBDP disintergrating tablet Place 0.125 mg under the tongue every 4 (four) hours as needed for bladder spasms.    Historical Provider, MD  levalbuterol [facility] HFA) 45 MCG/ACT inhaler Inhale 2 puffs into the lungs every 4 (four) hours as needed for wheezing.    Historical Provider, MD  levalbuterol Pauline Aus) 1.25 MG/3ML nebulizer solution Take 1.25 mg by nebulization every 4 (four) hours as needed for wheezing.    Historical Provider, MD  montelukast (SINGULAIR) 10 MG tablet Take 10 mg by mouth at bedtime. 12/02/13   Historical Provider, MD  nystatin cream (MYCOSTATIN) Apply to affected area 2 times daily 03/17/14   Hilario Quarry, MD  omeprazole (PRILOSEC) 20 MG capsule Take 20 mg by mouth daily.    Historical Provider, MD  ondansetron (ZOFRAN ODT) 8 MG disintegrating tablet  ODT q4 hours prn nausea 03/23/14   Hurman Horn, MD  ondansetron (ZOFRAN) 4 MG tablet Take 4 mg by mouth every 8 (eight) hours as needed for nausea or vomiting.    Historical Provider, MD  polyethylene glycol (MIRALAX / GLYCOLAX) packet Take 17 g by mouth daily as needed for moderate constipation.    Historical Provider, MD  potassium chloride SA (K-DUR,KLOR-CON) 20 MEQ tablet Take 20 mEq by mouth daily. 01/17/14   Historical Provider, MD  Probiotic Product (PROBIOTIC DAILY PO) Take 1-2 tablets by mouth daily.    Historical Provider, MD  promethazine (PHENERGAN) 12.5 MG tablet Take 1-2 tablets (12.5-25 mg total) by mouth every 6 (six) hours as needed for nausea or vomiting. 03/08/14   Kela Millin, MD  triamcinolone (NASACORT ALLERGY 24HR) 55 MCG/ACT AERO nasal inhaler Place 2 sprays into the nose daily as needed (for stuffy nose).     Historical Provider, MD  triamcinolone cream (KENALOG) 0.1 % Apply 1 application topically daily as needed (for dry skin).  08/17/13   Historical Provider, MD  Vitamin D,  Ergocalciferol, (DRISDOL) 50000 UNITS CAPS capsule Take 50,000 Units by mouth every 7 (seven) days.    Historical Provider, MD   BP 117/78  Pulse 114  Temp(Src) 98.1 F (36.7 C)  Resp 22  Wt 173 lb (78.472 kg)  SpO2 93% Physical Exam  Nursing note and vitals reviewed. Constitutional: She appears well-developed and well-nourished. No distress.  HENT:  Head: Normocephalic and atraumatic.  Eyes: Conjunctivae and EOM are normal.  Neck: Normal range of motion.  Cardiovascular: Normal rate and regular rhythm.  Exam reveals no gallop and no friction rub.   No murmur heard. Pulmonary/Chest: Effort normal and breath sounds normal. She has no wheezes. She has no rales. She exhibits no tenderness.  Abdominal: Soft. She exhibits no distension. There is tenderness. There is no rebound and no guarding.  Mild generalized tenderness to palpation most notable in the lower abdomen. No focal tenderness or peritoneal signs.   Musculoskeletal: Normal range of motion.  Neurological: She is alert.  Speech is goal-oriented. Moves limbs without ataxia.   Skin: Skin is warm and dry.  Psychiatric: She  has a normal mood and affect. Her behavior is normal.    ED Course  Procedures (including critical care time) Labs Review Labs Reviewed  URINALYSIS, ROUTINE W REFLEX MICROSCOPIC - Abnormal; Notable for the following:    Color, Urine AMBER (*)    Ketones, ur 15 (*)    All other components within normal limits  CBC WITH DIFFERENTIAL - Abnormal; Notable for the following:    Monocytes Absolute 1.1 (*)    All other components within normal limits  COMPREHENSIVE METABOLIC PANEL - Abnormal; Notable for the following:    Glucose, Bld 141 (*)    Alkaline Phosphatase 126 (*)    GFR calc non Af Amer 64 (*)    GFR calc Af Amer 74 (*)    Anion gap 16 (*)    All other components within normal limits  CBG MONITORING, ED - Abnormal; Notable for the following:    Glucose-Capillary 110 (*)    All other components  within normal limits  RAPID STREP SCREEN  CULTURE, GROUP A STREP  LIPASE, BLOOD  OCCULT BLOOD X 1 CARD TO LAB, STOOL    Imaging Review Ct Abdomen Pelvis W Contrast  06/11/2014   CLINICAL DATA:  Right lower quadrant/periumbilical pain for several months, abdominal swelling, nausea/ vomiting, diarrhea, antibiotics for recent sore throat. History of hysterectomy with bilateral oophorectomy and cholecystectomy.  EXAM: CT ABDOMEN AND PELVIS WITH CONTRAST  TECHNIQUE: Multidetector CT imaging of the abdomen and pelvis was performed using the standard protocol following bolus administration of intravenous contrast.  CONTRAST:  OMNIPAQUE IOHEXOL 300 MG/ML  SOLN  COMPARISON:  Abdominal ultrasound dated 05/17/2014. CT abdomen pelvis dated 12/02/2013.  FINDINGS: Lower chest:  Mild scarring/ atelectasis at the lung bases.  Hepatobiliary: Hepatic steatosis.  Status post cholecystectomy. Pneumobilia. No intrahepatic or extrahepatic ductal dilatation.  Spleen: Within normal limits.  Pancreas: Stable mild prominence of the pancreatic duct at the level of the pancreatic head, unchanged, and without obstructing pancreatic mass.  Stomach/Bowel: Moderate hiatal hernia.  No evidence of bowel obstruction.  Normal appendix.  Colonic diverticulosis with wall thickening/inflammatory changes involving the sigmoid colon (series 2/image 36), suspicious for sigmoid diverticulitis.  Associated trace left pelvic ascites. No drainable fluid collection/ abscess. No free air to suggest macroscopic perforation.  Adrenals/urinary tract: Adrenal glands are unremarkable.  Kidneys are within normal limits.  No hydronephrosis.  Bladder is within normal limits.  Vascular/Lymphatic: No evidence of abdominal aortic aneurysm.  No suspicious abdominopelvic lymphadenopathy.  Reproductive: Status post hysterectomy and bilateral salpingo-oophorectomy.  Musculoskeletal: Visualized osseous structures are within normal limits.  Other: None.   IMPRESSION: Sigmoid diverticulitis. No drainable fluid collection/ abscess. No free air.  Associated trace left pelvic ascites.   Electronically Signed   By: Charline Bills M.D.   On: 06/11/2014 20:21     EKG Interpretation None      MDM   Final diagnoses:  Diverticulitis of large intestine without perforation or abscess without bleeding    5:00 PM Labs and urinalysis unremarkable for acute changes. Patient is tachycardic with remaining vitals stable. Patient getting fluids currently. Fecal occult stool negative.   9:45 PM Patient's CT shows diverticulitis of sigmoid colon. Patient will have Augmentin PO due to Cipro and flagyl allergy. Patient states she can tolerate Augmentin. Patient reports having oxycodone prescription at home that she takes daily from her PCP for DGD. Patient is requesting a stronger narcotic for pain which I explained to her I do not prescribe anything stronger than oxycodone.  Patient is unhappy about this. Patient instructed to follow up with PCP and GI doctor. Tachycardia has improved since receiving IV fluids. No further evaluation needed at this time.   Emilia Beck, [place] 06/11/14 2149

## 2014-06-11 NOTE — ED Notes (Signed)
Patient complains of bright red rectal bleeding for some time, was to have a GI test, unsure of procedure for same last month and unable to make appointment. Also reports flank pain and sore thorat. Patient pale on arrival. Nausea with same.

## 2014-06-12 NOTE — ED Provider Notes (Signed)
53 y.o female presents complaining of abdominal pain and bloody stools.  WDWN female appears uncomfortable. cv- rrr Lungs cta Abdomen- soft moderate diffuse ttp lower abdomen   I performed a history and physical examination of Holly Rogers and discussed her management with Ms. Murray Hodgkins.  I agree with the history, physical, assessment, and plan of care, with the following exceptions: None  I was present for the following procedures: None Time Spent in Critical Care of the patient: None Time spent in discussions with the patient and family: 10  Zaidan Keeble Corlis Leak, MD 06/12/14 [phone]

## 2014-06-13 LAB — CULTURE, GROUP A STREP

## 2014-09-22 ENCOUNTER — Telehealth: Payer: Self-pay | Admitting: Internal Medicine

## 2014-09-22 ENCOUNTER — Telehealth: Payer: Self-pay | Admitting: Critical Care Medicine

## 2014-09-22 DIAGNOSIS — R05 Cough: Secondary | ICD-10-CM

## 2014-09-22 DIAGNOSIS — R059 Cough, unspecified: Secondary | ICD-10-CM

## 2014-09-22 NOTE — Telephone Encounter (Signed)
Called back and spoke with Burna MortimerWanda and [facility]--she will check with customer service to see if they are able to draw this test. Will await call back

## 2014-09-22 NOTE — Telephone Encounter (Signed)
LM for Marchelle FolksAmanda with Referrals Dept [facility] to return call.

## 2014-09-22 NOTE — Telephone Encounter (Signed)
It's basically a RAST test for resp allergy - I placed in order section how I usually order it but out lab won't see it

## 2014-09-22 NOTE — Telephone Encounter (Signed)
[facility] - labs were ordered today on patient and they received an order for CBC and Allergy Profile. Per Burna MortimerWanda, they do not have an "allergy profile" lab test. Needing more specific lab instructions. Burna MortimerWanda states that if we have a Loss adjuster, chartered"Directory of Services" [facility] book, we can look in the back of the book under "allergy", we can locate the specific allergy test needed. States that she went ahead and drew 4 tubes of blood and will wait for us to call back with order. They can hold the blood until Monday, after Monday, blood will be thrown out. Please advise Dr Sherene SiresWert what specific allergy the patient is being tested for or what specific allergy class. Thanks.

## 2014-09-25 NOTE — Telephone Encounter (Signed)
L[facility]. Carron CurieJennifer Quantavia Frith, CMA

## 2014-09-26 NOTE — Telephone Encounter (Signed)
LM with Marchelle FolksAmanda - [facility] to return call.

## 2014-09-27 NOTE — Telephone Encounter (Signed)
lmtcb for Allstatemanda. Per triage protocol. Will sign off.   Pt already has appt (consult) with RA on 10/26/2014 for asthma and sarcoid per Dr. Jessee Aversobert Toborg.

## 2014-09-27 NOTE — Telephone Encounter (Signed)
ATC [facility] x 2. Both times I was disconnected while on hold waiting for a rep. WCB.

## 2014-10-02 NOTE — Telephone Encounter (Signed)
MW please advise. It looks as though the blood is too old to use now. Would you like us to call patient to come back here and have lab drawn for RAST-allergy profile and go through [facility] as we usually do or set up patient to go back through [facility]? Thanks.

## 2014-10-02 NOTE — Telephone Encounter (Signed)
No need for this test now, will address at next ov

## 2014-10-02 NOTE — Telephone Encounter (Signed)
Pt has consult with RA on 10/26/2014. Nothing further needed. Will sign off.

## 2014-10-26 ENCOUNTER — Institutional Professional Consult (permissible substitution): Payer: Medicare Other | Admitting: Pulmonary Disease

## 2014-11-07 ENCOUNTER — Emergency Department (HOSPITAL_BASED_OUTPATIENT_CLINIC_OR_DEPARTMENT_OTHER)
Admission: EM | Admit: 2014-11-07 | Discharge: 2014-11-07 | Disposition: A | Discharge status: Home / Self Care | Payer: Medicare Other | Attending: Emergency Medicine | Admitting: Emergency Medicine

## 2014-11-07 ENCOUNTER — Encounter (HOSPITAL_BASED_OUTPATIENT_CLINIC_OR_DEPARTMENT_OTHER): Payer: Self-pay

## 2014-11-07 ENCOUNTER — Emergency Department (HOSPITAL_BASED_OUTPATIENT_CLINIC_OR_DEPARTMENT_OTHER): Payer: Medicare Other

## 2014-11-07 DIAGNOSIS — K219 Gastro-esophageal reflux disease without esophagitis: Secondary | ICD-10-CM | POA: Diagnosis not present

## 2014-11-07 DIAGNOSIS — R05 Cough: Secondary | ICD-10-CM

## 2014-11-07 DIAGNOSIS — Z79899 Other long term (current) drug therapy: Secondary | ICD-10-CM | POA: Diagnosis not present

## 2014-11-07 DIAGNOSIS — Z8679 Personal history of other diseases of the circulatory system: Secondary | ICD-10-CM | POA: Diagnosis not present

## 2014-11-07 DIAGNOSIS — Z8701 Personal history of pneumonia (recurrent): Secondary | ICD-10-CM | POA: Diagnosis not present

## 2014-11-07 DIAGNOSIS — R000 Tachycardia, unspecified: Secondary | ICD-10-CM | POA: Diagnosis not present

## 2014-11-07 DIAGNOSIS — J069 Acute upper respiratory infection, unspecified: Secondary | ICD-10-CM

## 2014-11-07 DIAGNOSIS — J0110 Acute frontal sinusitis, unspecified: Secondary | ICD-10-CM | POA: Diagnosis not present

## 2014-11-07 DIAGNOSIS — F419 Anxiety disorder, unspecified: Secondary | ICD-10-CM | POA: Insufficient documentation

## 2014-11-07 DIAGNOSIS — Z8619 Personal history of other infectious and parasitic diseases: Secondary | ICD-10-CM | POA: Diagnosis not present

## 2014-11-07 DIAGNOSIS — E119 Type 2 diabetes mellitus without complications: Secondary | ICD-10-CM | POA: Diagnosis not present

## 2014-11-07 DIAGNOSIS — Z791 Long term (current) use of non-steroidal anti-inflammatories (NSAID): Secondary | ICD-10-CM | POA: Diagnosis not present

## 2014-11-07 DIAGNOSIS — Z7952 Long term (current) use of systemic steroids: Secondary | ICD-10-CM | POA: Insufficient documentation

## 2014-11-07 DIAGNOSIS — Z880 Allergy status to penicillin: Secondary | ICD-10-CM | POA: Insufficient documentation

## 2014-11-07 DIAGNOSIS — J0100 Acute maxillary sinusitis, unspecified: Secondary | ICD-10-CM

## 2014-11-07 DIAGNOSIS — M1990 Unspecified osteoarthritis, unspecified site: Secondary | ICD-10-CM | POA: Insufficient documentation

## 2014-11-07 DIAGNOSIS — Z8669 Personal history of other diseases of the nervous system and sense organs: Secondary | ICD-10-CM | POA: Insufficient documentation

## 2014-11-07 DIAGNOSIS — R059 Cough, unspecified: Secondary | ICD-10-CM

## 2014-11-07 DIAGNOSIS — J449 Chronic obstructive pulmonary disease, unspecified: Secondary | ICD-10-CM | POA: Insufficient documentation

## 2014-11-07 DIAGNOSIS — J029 Acute pharyngitis, unspecified: Secondary | ICD-10-CM | POA: Diagnosis present

## 2014-11-07 LAB — RAPID STREP SCREEN (MED CTR MEBANE ONLY): STREPTOCOCCUS, GROUP A SCREEN (DIRECT): NEGATIVE

## 2014-11-07 MED ORDER — SALINE SPRAY 0.65 % NA SOLN: 1.0000 | NASAL | Status: DC | PRN

## 2014-11-07 MED ORDER — ALBUTEROL SULFATE (2.5 MG/3ML) 0.083% IN NEBU
5.0000 mg | INHALATION_SOLUTION | Freq: Once | RESPIRATORY_TRACT | Status: AC
  Administered 2014-11-07: 5 mg via RESPIRATORY_TRACT
  Filled 2014-11-07: qty 6

## 2014-11-07 MED ORDER — AZITHROMYCIN 250 MG PO TABS: 250.0000 mg | ORAL_TABLET | Freq: Every day | ORAL | Status: DC

## 2014-11-07 NOTE — ED Notes (Addendum)
C/o sore throat, dry cough x 3 days

## 2014-11-07 NOTE — ED Provider Notes (Signed)
CSN: [id number]     Arrival date & time 11/07/14  1544 History   First MD Initiated Contact with Patient 11/07/14 1552     Chief Complaint  Patient presents with  . Sore Throat     (Consider location/radiation/quality/duration/timing/severity/associated sxs/prior Treatment) HPI Comments: 54 year old female with a past medical history of sarcoidosis, COPD, pneumonia, diabetes, pancreatitis, arthritis and acid reflux presenting with sore throat, nasal congestion and chest congestion 2 days. Admits to associated dry cough. States her sinuses feel full. She tried taking over-the-counter Tylenol cold medicine with no relief. Admits to subjective fever and chills. States her glands feel swollen.  The history is provided by the patient.    Past Medical History  Diagnosis Date  . Sarcoidosis   . COPD (chronic obstructive pulmonary disease)   . Arthritis   . Acid reflux   . Mitral valve prolapse   . Diverticulosis   . Pancreatitis 1997  . Sepsis   . Pneumonia   . Sepsis   . Diabetes mellitus without complication    Past Surgical History  Procedure Laterality Date  . Cholecystectomy    . Abdominal hysterectomy     No family history on file. History  Substance Use Topics  . Smoking status: Passive Smoke Exposure - Never Smoker  . Smokeless tobacco: Never Used  . Alcohol Use: No   OB History    No data available     Review of Systems  Constitutional: Positive for fever and chills.  HENT: Positive for congestion, sinus pressure and sore throat.   Respiratory: Positive for cough.   Hematological: Positive for adenopathy.  All other systems reviewed and are negative.     Allergies  Amoxicillin; Avandia; Baclofen; Ceftin; Celebrex; Ciprofloxacin; Clindamycin/lincomycin; Doxycycline; Flagyl; Levaquin; Macrobid; Neurontin; Other; Sulfa antibiotics; Symbicort; Ultram; Vancomycin; Vioxx; Voltaren; and Tape  Home Medications   Prior to Admission medications   Medication Sig  Start Date End Date Taking? Authorizing Provider  albuterol (PROAIR HFA) 108 (90 BASE) MCG/ACT inhaler Inhale 2 puffs into the lungs every 6 (six) hours as needed for wheezing or shortness of breath.   Yes Historical Provider, MD  alprazolam Prudy Feeler) 2 MG tablet Take 2 mg by mouth 2 (two) times daily as needed for sleep or anxiety.    Yes Historical Provider, MD  cetirizine (ZYRTEC) 10 MG tablet Take 10 mg by mouth daily.   Yes Historical Provider, MD  cyclobenzaprine (FLEXERIL) 5 MG tablet Take 5 mg by mouth 3 (three) times daily as needed for muscle spasms.   Yes Historical Provider, MD  diclofenac sodium (VOLTAREN) 1 % GEL Apply 1 application topically 3 (three) times daily as needed (for pain).  01/18/14  Yes Historical Provider, MD  dicyclomine (BENTYL) 10 MG capsule Take 10 mg by mouth 4 (four) times daily -  before meals and at bedtime.   Yes Historical Provider, MD  fexofenadine (ALLEGRA) 180 MG tablet Take 180 mg by mouth daily.   Yes Historical Provider, MD  levalbuterol [facility] HFA) 45 MCG/ACT inhaler Inhale 2 puffs into the lungs every 4 (four) hours as needed for wheezing.   Yes Historical Provider, MD  levalbuterol (XOPENEX) 1.25 MG/3ML nebulizer solution Take 1.25 mg by nebulization every 4 (four) hours as needed for wheezing.   Yes Historical Provider, MD  montelukast (SINGULAIR) 10 MG tablet Take 10 mg by mouth at bedtime. 12/02/13  Yes Historical Provider, MD  omeprazole (PRILOSEC) 20 MG capsule Take 20 mg by mouth daily.   Yes Historical Provider,  MD  ondansetron (ZOFRAN) 4 MG tablet Take 4 mg by mouth every 8 (eight) hours as needed for nausea or vomiting.   Yes Historical Provider, MD  oxycodone (OXY-IR) 5 MG capsule Take 5 mg by mouth every 4 (four) hours as needed.   Yes Historical Provider, MD  pantoprazole (PROTONIX) 40 MG tablet Take 40 mg by mouth daily.   Yes Historical Provider, MD  triamcinolone (NASACORT ALLERGY 24HR) 55 MCG/ACT AERO nasal inhaler Place 2 sprays into the  nose daily as needed (for stuffy nose).    Yes Historical Provider, MD  triamcinolone cream (KENALOG) 0.1 % Apply 1 application topically daily as needed (for dry skin).  08/17/13  Yes Historical Provider, MD  Vitamin D, Ergocalciferol, (DRISDOL) 50000 UNITS CAPS capsule Take 50,000 Units by mouth every 7 (seven) days.   Yes Historical Provider, MD  amoxicillin-clavulanate (AUGMENTIN) 875-125 MG per tablet Take 1 tablet by mouth 2 (two) times daily. 06/11/14   Kaitlyn Szekalski, PA-C  azithromycin (ZITHROMAX) 250 MG tablet Take 1 tablet (250 mg total) by mouth daily. Take first 2 tablets together, then 1 every day until finished. 11/07/14   Kathrynn Speedobyn M Buffi Ewton, PA-C  HYDROcodone-acetaminophen (NORCO) 5-325 MG per tablet Take 2 tablets by mouth every 4 (four) hours as needed. 03/23/14   Hurman HornJohn M Bednar, MD  HYDROcodone-acetaminophen (NORCO/VICODIN) 5-325 MG per tablet Take 2 tablets by mouth every 6 (six) hours as needed for moderate pain.     Historical Provider, MD  hyoscyamine (ANASPAZ) 0.125 MG TBDP disintergrating tablet Place 0.125 mg under the tongue every 4 (four) hours as needed for bladder spasms.    Historical Provider, MD  nystatin cream (MYCOSTATIN) Apply to affected area 2 times daily 03/17/14   Hilario Quarryanielle S Ray, MD  ondansetron (ZOFRAN ODT) 8 MG disintegrating tablet 8mg  ODT q4 hours prn nausea 03/23/14   Hurman HornJohn M Bednar, MD  polyethylene glycol (MIRALAX / Ethelene HalGLYCOLAX) packet Take 17 g by mouth daily as needed for moderate constipation.    Historical Provider, MD  potassium chloride SA (K-DUR,KLOR-CON) 20 MEQ tablet Take 20 mEq by mouth daily. 01/17/14   Historical Provider, MD  Probiotic Product (PROBIOTIC DAILY PO) Take 1-2 tablets by mouth daily.    Historical Provider, MD  promethazine (PHENERGAN) 12.5 MG tablet Take 1-2 tablets (12.5-25 mg total) by mouth every 6 (six) hours as needed for nausea or vomiting. 03/08/14   Kela MillinAdeline C Viyuoh, MD  sodium chloride (OCEAN) 0.65 % SOLN nasal spray Place 1 spray into both  nostrils as needed for congestion. 11/07/14   Gauge Winski M Candler Ginsberg, PA-C   BP 124/85 mmHg  Pulse 110  Temp(Src) 97.8 F (36.6 C) (Oral)  Resp 20  Ht 5\' 5"  (1.651 m)  Wt 175 lb (79.379 kg)  BMI 29.12 kg/m2  SpO2 99% Physical Exam  Constitutional: She is oriented to person, place, and time. She appears well-developed and well-nourished. No distress.  HENT:  Head: Normocephalic and atraumatic.  Nose: Mucosal edema present. Right sinus exhibits maxillary sinus tenderness and frontal sinus tenderness. Left sinus exhibits maxillary sinus tenderness and frontal sinus tenderness.  Mouth/Throat: Uvula is midline. Posterior oropharyngeal erythema present. No oropharyngeal exudate or posterior oropharyngeal edema.  Post nasal drip.  Eyes: Conjunctivae and EOM are normal.  Neck: Normal range of motion. Neck supple.  Cardiovascular: Regular rhythm and normal heart sounds.   Tachycardia.  Pulmonary/Chest: Effort normal. No respiratory distress.  Scattered end-expiratory wheezes bilateral. Harsh cough present.  Musculoskeletal: Normal range of motion. She exhibits no  edema.  Lymphadenopathy:    She has cervical adenopathy.  Neurological: She is alert and oriented to person, place, and time. No sensory deficit.  Skin: Skin is warm and dry.  Psychiatric: Her behavior is normal. Her mood appears anxious.  Nursing note and vitals reviewed.   ED Course  Procedures (including critical care time) Labs Review Labs Reviewed  RAPID STREP SCREEN    Imaging Review Dg Chest 2 View  11/07/2014   CLINICAL DATA:  Sore throat and congestion.  History of sarcoidosis.  EXAM: CHEST - 2 VIEW  COMPARISON:  07/19/2014  FINDINGS: The heart size, mediastinal contours and hila are within normal limits. There is no evidence of pulmonary edema, consolidation, pneumothorax, nodule or pleural fluid. No pulmonary fibrotic disease identified. The visualized skeletal structures are unremarkable.  IMPRESSION: No active disease.    Electronically Signed   By: Irish Lack M.D.   On: 11/07/2014 16:37     EKG Interpretation None      MDM   Final diagnoses:  Acute frontal sinusitis, recurrence not specified  Acute maxillary sinusitis, recurrence not specified  Cough  URI (upper respiratory infection)   Nontoxic appearing and in no apparent distress. Tachycardic on arrival, anxious, afebrile. Vitals otherwise stable. End expiratory wheezes present, completely cleared after albuterol treatment. Rapid strep negative. Chest x-ray clear. Given multiple comorbidities, will treat with antibiotics. Advised nasal saline. Follow-up with PCP within the next 2-3 days. Stable for d/c. Return precautions given. Patient states understanding of treatment care plan and is agreeable.  Kathrynn Speed, PA-C 11/07/14 1655  Rolland Porter, MD 11/08/14 [phone]

## 2014-11-07 NOTE — Discharge Instructions (Signed)
Take azithromycin as prescribed along with nasal saline. Rest and stay well hydrated.  Sinusitis Sinusitis is redness, soreness, and inflammation of the paranasal sinuses. Paranasal sinuses are air pockets within the bones of your face (beneath the eyes, the middle of the forehead, or above the eyes). In healthy paranasal sinuses, mucus is able to drain out, and air is able to circulate through them by way of your nose. However, when your paranasal sinuses are inflamed, mucus and air can become trapped. This can allow bacteria and other germs to grow and cause infection. Sinusitis can develop quickly and last only a short time (acute) or continue over a long period (chronic). Sinusitis that lasts for more than 12 weeks is considered chronic.  CAUSES  Causes of sinusitis include:  Allergies.  Structural abnormalities, such as displacement of the cartilage that separates your nostrils (deviated septum), which can decrease the air flow through your nose and sinuses and affect sinus drainage.  Functional abnormalities, such as when the small hairs (cilia) that line your sinuses and help remove mucus do not work properly or are not present. SIGNS AND SYMPTOMS  Symptoms of acute and chronic sinusitis are the same. The primary symptoms are pain and pressure around the affected sinuses. Other symptoms include:  Upper toothache.  Earache.  Headache.  Bad breath.  Decreased sense of smell and taste.  A cough, which worsens when you are lying flat.  Fatigue.  Fever.  Thick drainage from your nose, which often is green and may contain pus (purulent).  Swelling and warmth over the affected sinuses. DIAGNOSIS  Your health care provider will perform a physical exam. During the exam, your health care provider may:  Look in your nose for signs of abnormal growths in your nostrils (nasal polyps).  Tap over the affected sinus to check for signs of infection.  View the inside of your sinuses  (endoscopy) using an imaging device that has a light attached (endoscope). If your health care provider suspects that you have chronic sinusitis, one or more of the following tests may be recommended:  Allergy tests.  Nasal culture. A sample of mucus is taken from your nose, sent to a lab, and screened for bacteria.  Nasal cytology. A sample of mucus is taken from your nose and examined by your health care provider to determine if your sinusitis is related to an allergy. TREATMENT  Most cases of acute sinusitis are related to a viral infection and will resolve on their own within 10 days. Sometimes medicines are prescribed to help relieve symptoms (pain medicine, decongestants, nasal steroid sprays, or saline sprays).  However, for sinusitis related to a bacterial infection, your health care provider will prescribe antibiotic medicines. These are medicines that will help kill the bacteria causing the infection.  Rarely, sinusitis is caused by a fungal infection. In theses cases, your health care provider will prescribe antifungal medicine. For some cases of chronic sinusitis, surgery is needed. Generally, these are cases in which sinusitis recurs more than 3 times per year, despite other treatments. HOME CARE INSTRUCTIONS   Drink plenty of water. Water helps thin the mucus so your sinuses can drain more easily.  Use a humidifier.  Inhale steam 3 to 4 times a day (for example, sit in the bathroom with the shower running).  Apply a warm, moist washcloth to your face 3 to 4 times a day, or as directed by your health care provider.  Use saline nasal sprays to help moisten and clean  your sinuses.  Take medicines only as directed by your health care provider.  If you were prescribed either an antibiotic or antifungal medicine, finish it all even if you start to feel better. SEEK IMMEDIATE MEDICAL CARE IF:  You have increasing pain or severe headaches.  You have nausea, vomiting, or  drowsiness.  You have swelling around your face.  You have vision problems.  You have a stiff neck.  You have difficulty breathing. MAKE SURE YOU:   Understand these instructions.  Will watch your condition.  Will get help right away if you are not doing well or get worse. Document Released: 09/01/2005 Document Revised: 01/16/2014 Document Reviewed: 09/16/2011 [facility] Patient Information 2015 [place], [place]. This information is not intended to replace advice given to you by your health care provider. Make sure you discuss any questions you have with your health care provider.  Upper Respiratory Infection, Adult An upper respiratory infection (URI) is also sometimes known as the common cold. The upper respiratory tract includes the nose, sinuses, throat, trachea, and bronchi. Bronchi are the airways leading to the lungs. Most people improve within 1 week, but symptoms can last up to 2 weeks. A residual cough may last even longer.  CAUSES Many different viruses can infect the tissues lining the upper respiratory tract. The tissues become irritated and inflamed and often become very moist. Mucus production is also common. A cold is contagious. You can easily spread the virus to others by oral contact. This includes kissing, sharing a glass, coughing, or sneezing. Touching your mouth or nose and then touching a surface, which is then touched by another person, can also spread the virus. SYMPTOMS  Symptoms typically develop 1 to 3 days after you come in contact with a cold virus. Symptoms vary from person to person. They may include:  Runny nose.  Sneezing.  Nasal congestion.  Sinus irritation.  Sore throat.  Loss of voice (laryngitis).  Cough.  Fatigue.  Muscle aches.  Loss of appetite.  Headache.  Low-grade fever. DIAGNOSIS  You might diagnose your own cold based on familiar symptoms, since most people get a cold 2 to 3 times a year. Your caregiver can confirm this  based on your exam. Most importantly, your caregiver can check that your symptoms are not due to another disease such as strep throat, sinusitis, pneumonia, asthma, or epiglottitis. Blood tests, throat tests, and X-rays are not necessary to diagnose a common cold, but they may sometimes be helpful in excluding other more serious diseases. Your caregiver will decide if any further tests are required. RISKS AND COMPLICATIONS  You may be at risk for a more severe case of the common cold if you smoke cigarettes, have chronic heart disease (such as heart failure) or lung disease (such as asthma), or if you have a weakened immune system. The very young and very old are also at risk for more serious infections. Bacterial sinusitis, middle ear infections, and bacterial pneumonia can complicate the common cold. The common cold can worsen asthma and chronic obstructive pulmonary disease (COPD). Sometimes, these complications can require emergency medical care and may be life-threatening. PREVENTION  The best way to protect against getting a cold is to practice good hygiene. Avoid oral or hand contact with people with cold symptoms. Wash your hands often if contact occurs. There is no clear evidence that vitamin C, vitamin E, echinacea, or exercise reduces the chance of developing a cold. However, it is always recommended to get plenty of rest and  practice good nutrition. TREATMENT  Treatment is directed at relieving symptoms. There is no cure. Antibiotics are not effective, because the infection is caused by a virus, not by bacteria. Treatment may include:  Increased fluid intake. Sports drinks offer valuable electrolytes, sugars, and fluids.  Breathing heated mist or steam (vaporizer or shower).  Eating chicken soup or other clear broths, and maintaining good nutrition.  Getting plenty of rest.  Using gargles or lozenges for comfort.  Controlling fevers with ibuprofen or acetaminophen as directed by your  caregiver.  Increasing usage of your inhaler if you have asthma. Zinc gel and zinc lozenges, taken in the first 24 hours of the common cold, can shorten the duration and lessen the severity of symptoms. Pain medicines may help with fever, muscle aches, and throat pain. A variety of non-prescription medicines are available to treat congestion and runny nose. Your caregiver can make recommendations and may suggest nasal or lung inhalers for other symptoms.  HOME CARE INSTRUCTIONS   Only take over-the-counter or prescription medicines for pain, discomfort, or fever as directed by your caregiver.  Use a warm mist humidifier or inhale steam from a shower to increase air moisture. This may keep secretions moist and make it easier to breathe.  Drink enough water and fluids to keep your urine clear or pale yellow.  Rest as needed.  Return to work when your temperature has returned to normal or as your caregiver advises. You may need to stay home longer to avoid infecting others. You can also use a face mask and careful hand washing to prevent spread of the virus. SEEK MEDICAL CARE IF:   After the first few days, you feel you are getting worse rather than better.  You need your caregiver's advice about medicines to control symptoms.  You develop chills, worsening shortness of breath, or brown or red sputum. These may be signs of pneumonia.  You develop yellow or brown nasal discharge or pain in the face, especially when you bend forward. These may be signs of sinusitis.  You develop a fever, swollen neck glands, pain with swallowing, or white areas in the back of your throat. These may be signs of strep throat. SEEK IMMEDIATE MEDICAL CARE IF:   You have a fever.  You develop severe or persistent headache, ear pain, sinus pain, or chest pain.  You develop wheezing, a prolonged cough, cough up blood, or have a change in your usual mucus (if you have chronic lung disease).  You develop sore  muscles or a stiff neck. Document Released: 02/25/2001 Document Revised: 11/24/2011 Document Reviewed: 12/07/2013 [facility] Patient Information 2015 [place]. This information is not intended to replace advice given to you by your health care provider. Make sure you discuss any questions you have with your health care provider.

## 2014-11-09 LAB — CULTURE, GROUP A STREP: Strep A Culture: NEGATIVE

## 2014-11-29 ENCOUNTER — Encounter (HOSPITAL_BASED_OUTPATIENT_CLINIC_OR_DEPARTMENT_OTHER): Payer: Self-pay

## 2014-11-29 ENCOUNTER — Emergency Department (HOSPITAL_BASED_OUTPATIENT_CLINIC_OR_DEPARTMENT_OTHER)
Admission: EM | Admit: 2014-11-29 | Discharge: 2014-11-30 | Disposition: A | Discharge status: Home / Self Care | Payer: Medicare Other | Attending: Emergency Medicine | Admitting: Emergency Medicine

## 2014-11-29 DIAGNOSIS — Z8619 Personal history of other infectious and parasitic diseases: Secondary | ICD-10-CM | POA: Insufficient documentation

## 2014-11-29 DIAGNOSIS — E119 Type 2 diabetes mellitus without complications: Secondary | ICD-10-CM | POA: Insufficient documentation

## 2014-11-29 DIAGNOSIS — H0289 Other specified disorders of eyelid: Secondary | ICD-10-CM

## 2014-11-29 DIAGNOSIS — Z792 Long term (current) use of antibiotics: Secondary | ICD-10-CM | POA: Diagnosis not present

## 2014-11-29 DIAGNOSIS — K219 Gastro-esophageal reflux disease without esophagitis: Secondary | ICD-10-CM | POA: Insufficient documentation

## 2014-11-29 DIAGNOSIS — M1990 Unspecified osteoarthritis, unspecified site: Secondary | ICD-10-CM | POA: Diagnosis not present

## 2014-11-29 DIAGNOSIS — H578 Other specified disorders of eye and adnexa: Secondary | ICD-10-CM | POA: Insufficient documentation

## 2014-11-29 DIAGNOSIS — J449 Chronic obstructive pulmonary disease, unspecified: Secondary | ICD-10-CM | POA: Insufficient documentation

## 2014-11-29 DIAGNOSIS — Z79899 Other long term (current) drug therapy: Secondary | ICD-10-CM | POA: Diagnosis not present

## 2014-11-29 DIAGNOSIS — D860 Sarcoidosis of lung: Secondary | ICD-10-CM | POA: Insufficient documentation

## 2014-11-29 DIAGNOSIS — Z791 Long term (current) use of non-steroidal anti-inflammatories (NSAID): Secondary | ICD-10-CM | POA: Insufficient documentation

## 2014-11-29 DIAGNOSIS — Z8679 Personal history of other diseases of the circulatory system: Secondary | ICD-10-CM | POA: Diagnosis not present

## 2014-11-29 DIAGNOSIS — Z7952 Long term (current) use of systemic steroids: Secondary | ICD-10-CM | POA: Diagnosis not present

## 2014-11-29 DIAGNOSIS — R05 Cough: Secondary | ICD-10-CM | POA: Diagnosis present

## 2014-11-29 DIAGNOSIS — Z880 Allergy status to penicillin: Secondary | ICD-10-CM | POA: Insufficient documentation

## 2014-11-29 MED ORDER — METHYLPREDNISOLONE 4 MG PO KIT: PACK | ORAL | Status: DC

## 2014-11-29 MED ORDER — ERYTHROMYCIN 5 MG/GM OP OINT
TOPICAL_OINTMENT | Freq: Four times a day (QID) | OPHTHALMIC | Status: DC
  Administered 2014-11-30: 1 via OPHTHALMIC
  Filled 2014-11-29: qty 3.5

## 2014-11-29 NOTE — ED Notes (Signed)
Pt reports one month of nasal congestion, cough, headaches, right eye pain, watering, swollen and red. Pt reports she has been treated with multiple medications but does not feel any better.

## 2014-11-29 NOTE — ED Provider Notes (Signed)
CSN: [id number]     Arrival date & time 11/29/14  2109 History  This chart was scribed for [name], MD by Chestine SporeSoijett Blue, ED Scribe. The patient was seen in room MH03/MH03 at 11:36 PM.     Chief Complaint  Patient presents with  . URI     The history is provided by the patient. No language interpreter was used.    HPI Comments: Holly Rogers is a 54 y.o. female with a medical hx of sarcoidosis who presents to the Emergency Department complaining of URI onset 1 month ago. Pt has been seen a total of six times since onset of symptoms with no relief. She was told last week by her PCP that she had bronchitis and was treated with Zithromax which caused her to have diarrhea. She states that she is having associated symptoms of nasal congestion, cough, HA and fatigue. She denies nausea or vomiting.   She developed eye symptoms yesterday and was seen by an eye doctor who gave her brochures for blepharitis, stye and chalazion; he prescribed no medications and she states he told her she would always have these symptoms. Pt has tried Zyrtec, Tylenol and her inhaler for her symptoms with inadequate relief. She denies fever or chills. Pt notes that when she takes abx sometimes she will have C. difficile.   Pt reports that when she uses prednisone for her symptoms it typically helps.   Past Medical History  Diagnosis Date  . Sarcoidosis   . COPD (chronic obstructive pulmonary disease)   . Arthritis   . Acid reflux   . Mitral valve prolapse   . Diverticulosis   . Pancreatitis 1997  . Sepsis   . Pneumonia   . Sepsis   . Diabetes mellitus without complication    Past Surgical History  Procedure Laterality Date  . Cholecystectomy    . Abdominal hysterectomy     History reviewed. No pertinent family history. History  Substance Use Topics  . Smoking status: Passive Smoke Exposure - Never Smoker  . Smokeless tobacco: Never Used  . Alcohol Use: No   OB History    No data available      Review of Systems  A complete 10 system review of systems was obtained and all systems are negative except as noted in the HPI and PMH.    Allergies  Amoxicillin; Avandia; Baclofen; Ceftin; Celebrex; Ciprofloxacin; Clindamycin/lincomycin; Doxycycline; Flagyl; Levaquin; Macrobid; Neurontin; Other; Sulfa antibiotics; Symbicort; Ultram; Vancomycin; Vioxx; Voltaren; and Tape  Home Medications   Prior to Admission medications   Medication Sig Start Date End Date Taking? Authorizing Provider  albuterol (PROAIR HFA) 108 (90 BASE) MCG/ACT inhaler Inhale 2 puffs into the lungs every 6 (six) hours as needed for wheezing or shortness of breath.    Historical Provider, MD  alprazolam Prudy Feeler(XANAX) 2 MG tablet Take 2 mg by mouth 2 (two) times daily as needed for sleep or anxiety.     Historical Provider, MD  amoxicillin-clavulanate (AUGMENTIN) 875-125 MG per tablet Take 1 tablet by mouth 2 (two) times daily. 06/11/14   Kaitlyn Szekalski, PA-C  azithromycin (ZITHROMAX) 250 MG tablet Take 1 tablet (250 mg total) by mouth daily. Take first 2 tablets together, then 1 every day until finished. 11/07/14   Kathrynn Speedobyn M Hess, PA-C  cetirizine (ZYRTEC) 10 MG tablet Take 10 mg by mouth daily.    Historical Provider, MD  cyclobenzaprine (FLEXERIL) 5 MG tablet Take 5 mg by mouth 3 (three) times daily as needed for muscle  spasms.    Historical Provider, MD  diclofenac sodium (VOLTAREN) 1 % GEL Apply 1 application topically 3 (three) times daily as needed (for pain).  01/18/14   Historical Provider, MD  dicyclomine (BENTYL) 10 MG capsule Take 10 mg by mouth 4 (four) times daily -  before meals and at bedtime.    Historical Provider, MD  fexofenadine (ALLEGRA) 180 MG tablet Take 180 mg by mouth daily.    Historical Provider, MD  HYDROcodone-acetaminophen (NORCO) 5-325 MG per tablet Take 2 tablets by mouth every 4 (four) hours as needed. 03/23/14   Wayland Salinas, MD  HYDROcodone-acetaminophen (NORCO/VICODIN) 5-325 MG per tablet Take 2  tablets by mouth every 6 (six) hours as needed for moderate pain.     Historical Provider, MD  hyoscyamine (ANASPAZ) 0.125 MG TBDP disintergrating tablet Place 0.125 mg under the tongue every 4 (four) hours as needed for bladder spasms.    Historical Provider, MD  levalbuterol [facility] HFA) 45 MCG/ACT inhaler Inhale 2 puffs into the lungs every 4 (four) hours as needed for wheezing.    Historical Provider, MD  levalbuterol Pauline Aus) 1.25 MG/3ML nebulizer solution Take 1.25 mg by nebulization every 4 (four) hours as needed for wheezing.    Historical Provider, MD  methylPREDNISolone (MEDROL DOSEPAK) 4 MG tablet Take tapering dose per package instructions. 11/29/14   Anjalee Cope, MD  montelukast (SINGULAIR) 10 MG tablet Take 10 mg by mouth at bedtime. 12/02/13   Historical Provider, MD  nystatin cream (MYCOSTATIN) Apply to affected area 2 times daily 03/17/14   Margarita Grizzle, MD  omeprazole (PRILOSEC) 20 MG capsule Take 20 mg by mouth daily.    Historical Provider, MD  ondansetron (ZOFRAN ODT) 8 MG disintegrating tablet  ODT q4 hours prn nausea 03/23/14   Wayland Salinas, MD  ondansetron (ZOFRAN) 4 MG tablet Take 4 mg by mouth every 8 (eight) hours as needed for nausea or vomiting.    Historical Provider, MD  oxycodone (OXY-IR) 5 MG capsule Take 5 mg by mouth every 4 (four) hours as needed.    Historical Provider, MD  pantoprazole (PROTONIX) 40 MG tablet Take 40 mg by mouth daily.    Historical Provider, MD  polyethylene glycol (MIRALAX / GLYCOLAX) packet Take 17 g by mouth daily as needed for moderate constipation.    Historical Provider, MD  potassium chloride SA (K-DUR,KLOR-CON) 20 MEQ tablet Take 20 mEq by mouth daily. 01/17/14   Historical Provider, MD  Probiotic Product (PROBIOTIC DAILY PO) Take 1-2 tablets by mouth daily.    Historical Provider, MD  promethazine (PHENERGAN) 12.5 MG tablet Take 1-2 tablets (12.5-25 mg total) by mouth every 6 (six) hours as needed for nausea or vomiting. 03/08/14   Kela Millin, MD  sodium chloride (OCEAN) 0.65 % SOLN nasal spray Place 1 spray into both nostrils as needed for congestion. 11/07/14   Kathrynn Speed, PA-C  triamcinolone (NASACORT ALLERGY 24HR) 55 MCG/ACT AERO nasal inhaler Place 2 sprays into the nose daily as needed (for stuffy nose).     Historical Provider, MD  triamcinolone cream (KENALOG) 0.1 % Apply 1 application topically daily as needed (for dry skin).  08/17/13   Historical Provider, MD  Vitamin D, Ergocalciferol, (DRISDOL) 50000 UNITS CAPS capsule Take 50,000 Units by mouth every 7 (seven) days.    Historical Provider, MD   BP 143/104 mmHg  Pulse 96  Temp(Src) 98.1 F (36.7 C) (Oral)  Resp 18  Ht  (1.651 m)  Wt 172 lb (78.019  kg)  BMI 28.62 kg/m2  SpO2 98%  Physical Exam General: Well-developed, well-nourished female in no acute distress; appearance consistent with age of record HENT: normocephalic; atraumatic Eyes: pupils equal, round and reactive to light; extraocular muscles intact. Erythema and tenderness of the right lower eyelid. Neck: supple Heart: regular rate and rhythm Lungs: clear to auscultation bilaterally Abdomen: soft; nondistended; nontender; bowel sounds present Extremities: No deformity; full range of motion; pulses normal Neurologic: Awake, alert and oriented; motor function intact in all extremities and symmetric; no facial droop Skin: Warm and dry Psychiatric: Flat affect; circumstantial speech  ED Course  Procedures (including critical care time) DIAGNOSTIC STUDIES: Oxygen Saturation is 98% on RA, normal by my interpretation.    COORDINATION OF CARE: 11:47 PM-Discussed treatment plan which includes methylprednisolone Rx with pt at bedside and pt agreed to plan.     MDM  The patient seems obsessed with her symptomatology. She has with her a comprehensive diarrhea logging all of her symptoms, medications and medical visits. As noted above she has had at least 6 visits to EDS and private physicians  in the past month. Apart from the right eyelid inflammation, her examination is benign at this time. There does not appear to be an indication for antibiotics at this time, apart from topical antibiotic to the right eyelid. Given her multiple medication allergies and history of Clostridium difficile antibiotics are best avoided in her case. We will place her on a Medrol Dosepak.  She states she was referred to our pulmonology. Review of records indicates to this referral was a year ago and she has canceled appointments on several occasions. She states she is still trying to get and all of our pulmonology.   Final diagnoses:  Sarcoidosis of lung  Irritation of eyelid   I personally performed the services described in this documentation, which was scribed in my presence. The recorded information has been reviewed and is accurate.   Paula Libra, MD 11/30/14 [phone]

## 2014-11-29 NOTE — ED Notes (Signed)
C/o congestion, ha cough x 1 month  Has been seen for same w no relief

## 2014-11-30 DIAGNOSIS — D860 Sarcoidosis of lung: Secondary | ICD-10-CM | POA: Diagnosis not present

## 2014-12-18 ENCOUNTER — Emergency Department (HOSPITAL_BASED_OUTPATIENT_CLINIC_OR_DEPARTMENT_OTHER)
Admission: EM | Admit: 2014-12-18 | Discharge: 2014-12-18 | Disposition: A | Discharge status: Home / Self Care | Payer: Medicare Other | Attending: Emergency Medicine | Admitting: Emergency Medicine

## 2014-12-18 ENCOUNTER — Encounter (HOSPITAL_BASED_OUTPATIENT_CLINIC_OR_DEPARTMENT_OTHER): Payer: Self-pay | Admitting: Emergency Medicine

## 2014-12-18 DIAGNOSIS — Z862 Personal history of diseases of the blood and blood-forming organs and certain disorders involving the immune mechanism: Secondary | ICD-10-CM | POA: Insufficient documentation

## 2014-12-18 DIAGNOSIS — Z8619 Personal history of other infectious and parasitic diseases: Secondary | ICD-10-CM | POA: Diagnosis not present

## 2014-12-18 DIAGNOSIS — E119 Type 2 diabetes mellitus without complications: Secondary | ICD-10-CM | POA: Diagnosis not present

## 2014-12-18 DIAGNOSIS — K029 Dental caries, unspecified: Secondary | ICD-10-CM | POA: Diagnosis not present

## 2014-12-18 DIAGNOSIS — K219 Gastro-esophageal reflux disease without esophagitis: Secondary | ICD-10-CM | POA: Diagnosis not present

## 2014-12-18 DIAGNOSIS — J449 Chronic obstructive pulmonary disease, unspecified: Secondary | ICD-10-CM | POA: Diagnosis not present

## 2014-12-18 DIAGNOSIS — Z880 Allergy status to penicillin: Secondary | ICD-10-CM | POA: Insufficient documentation

## 2014-12-18 DIAGNOSIS — Z79899 Other long term (current) drug therapy: Secondary | ICD-10-CM | POA: Diagnosis not present

## 2014-12-18 DIAGNOSIS — Z8679 Personal history of other diseases of the circulatory system: Secondary | ICD-10-CM | POA: Diagnosis not present

## 2014-12-18 DIAGNOSIS — Z8701 Personal history of pneumonia (recurrent): Secondary | ICD-10-CM | POA: Diagnosis not present

## 2014-12-18 DIAGNOSIS — Z792 Long term (current) use of antibiotics: Secondary | ICD-10-CM | POA: Diagnosis not present

## 2014-12-18 DIAGNOSIS — M1990 Unspecified osteoarthritis, unspecified site: Secondary | ICD-10-CM | POA: Diagnosis not present

## 2014-12-18 DIAGNOSIS — K088 Other specified disorders of teeth and supporting structures: Secondary | ICD-10-CM | POA: Diagnosis present

## 2014-12-18 MED ORDER — BUPIVACAINE-EPINEPHRINE (PF) 0.5% -1:200000 IJ SOLN
1.8000 mL | Freq: Once | INTRAMUSCULAR | Status: AC
  Administered 2014-12-18: 1.8 mL
  Filled 2014-12-18: qty 1.8

## 2014-12-18 NOTE — ED Notes (Signed)
Pt alert, NAD, calm, interactive, resps e/u, no dyspnea, voice hoarse, pt states: "I have fibromyalgia, chronic fatigue syndrome, I wonder if I am being poisoned, L upper dental pain, nausea, feel dehydrated & have dry mouth".  Pt has zofran & oxycodone in hand at this time, just took zofran at this time, denies fever.

## 2014-12-18 NOTE — Discharge Instructions (Signed)

## 2014-12-18 NOTE — ED Notes (Signed)
Dr. Molpus at BS 

## 2014-12-18 NOTE — ED Notes (Addendum)
Pt reports: "Nausea better after taking her (own) zofran". No changes. Alert, NAD, calm, ambulatory to b/r, steady gait. Urine sample saved. Denies needs or questions.

## 2014-12-18 NOTE — ED Notes (Signed)
Pt reports upper left canine tooth area pain caries noted

## 2014-12-18 NOTE — ED Provider Notes (Addendum)
CSN: [id number]     Arrival date & time 12/18/14  0009 History   None    Chief Complaint  Patient presents with  . Dental Pain     (Consider location/radiation/quality/duration/timing/severity/associated sxs/prior Treatment) HPI  This is a 54 year old female who complains of a 2 day history of pain associated with her left upper canine tooth. This tooth has been decayed for a long period of time but has now become painful. She states the pain radiates to the left side of her face and makes her mouth feel irritated. She has not gotten adequate relief with her Percocet. She has also had nausea for the past 2 days which she attributes to being exposed to someone with a GI virus in her doctor's office 3 days ago. She has had relief with her own Zofran.  Past Medical History  Diagnosis Date  . Sarcoidosis   . COPD (chronic obstructive pulmonary disease)   . Arthritis   . Acid reflux   . Mitral valve prolapse   . Diverticulosis   . Pancreatitis 1997  . Sepsis   . Pneumonia   . Sepsis   . Diabetes mellitus without complication    Past Surgical History  Procedure Laterality Date  . Cholecystectomy    . Abdominal hysterectomy     History reviewed. No pertinent family history. History  Substance Use Topics  . Smoking status: Passive Smoke Exposure - Never Smoker  . Smokeless tobacco: Never Used  . Alcohol Use: No   OB History    No data available     Review of Systems  All other systems reviewed and are negative.   Allergies  Amoxicillin; Avandia; Baclofen; Ceftin; Celebrex; Ciprofloxacin; Clindamycin/lincomycin; Doxycycline; Flagyl; Levaquin; Macrobid; Neurontin; Other; Sulfa antibiotics; Symbicort; Ultram; Vancomycin; Vioxx; Voltaren; and Tape  Home Medications   Prior to Admission medications   Medication Sig Start Date End Date Taking? Authorizing Provider  albuterol (PROAIR HFA) 108 (90 BASE) MCG/ACT inhaler Inhale 2 puffs into the lungs every 6 (six) hours as needed  for wheezing or shortness of breath.    Historical Provider, MD  alprazolam Prudy Feeler(XANAX) 2 MG tablet Take 2 mg by mouth 2 (two) times daily as needed for sleep or anxiety.     Historical Provider, MD  amoxicillin-clavulanate (AUGMENTIN) 875-125 MG per tablet Take 1 tablet by mouth 2 (two) times daily. 06/11/14   Kaitlyn Szekalski, PA-C  azithromycin (ZITHROMAX) 250 MG tablet Take 1 tablet (250 mg total) by mouth daily. Take first 2 tablets together, then 1 every day until finished. 11/07/14   Kathrynn Speedobyn M Hess, PA-C  cetirizine (ZYRTEC) 10 MG tablet Take 10 mg by mouth daily.    Historical Provider, MD  cyclobenzaprine (FLEXERIL) 5 MG tablet Take 5 mg by mouth 3 (three) times daily as needed for muscle spasms.    Historical Provider, MD  diclofenac sodium (VOLTAREN) 1 % GEL Apply 1 application topically 3 (three) times daily as needed (for pain).  01/18/14   Historical Provider, MD  dicyclomine (BENTYL) 10 MG capsule Take 10 mg by mouth 4 (four) times daily -  before meals and at bedtime.    Historical Provider, MD  fexofenadine (ALLEGRA) 180 MG tablet Take 180 mg by mouth daily.    Historical Provider, MD  HYDROcodone-acetaminophen (NORCO) 5-325 MG per tablet Take 2 tablets by mouth every 4 (four) hours as needed. 03/23/14   Wayland SalinasJohn Bednar, MD  HYDROcodone-acetaminophen (NORCO/VICODIN) 5-325 MG per tablet Take 2 tablets by mouth every 6 (six) hours  as needed for moderate pain.     Historical Provider, MD  hyoscyamine (ANASPAZ) 0.125 MG TBDP disintergrating tablet Place 0.125 mg under the tongue every 4 (four) hours as needed for bladder spasms.    Historical Provider, MD  levalbuterol [facility] HFA) 45 MCG/ACT inhaler Inhale 2 puffs into the lungs every 4 (four) hours as needed for wheezing.    Historical Provider, MD  levalbuterol Pauline Aus) 1.25 MG/3ML nebulizer solution Take 1.25 mg by nebulization every 4 (four) hours as needed for wheezing.    Historical Provider, MD  methylPREDNISolone (MEDROL DOSEPAK) 4 MG tablet  Take tapering dose per package instructions. 11/29/14   Kyvon Hu, MD  montelukast (SINGULAIR) 10 MG tablet Take 10 mg by mouth at bedtime. 12/02/13   Historical Provider, MD  nystatin cream (MYCOSTATIN) Apply to affected area 2 times daily 03/17/14   Margarita Grizzle, MD  omeprazole (PRILOSEC) 20 MG capsule Take 20 mg by mouth daily.    Historical Provider, MD  ondansetron (ZOFRAN ODT) 8 MG disintegrating tablet  ODT q4 hours prn nausea 03/23/14   Wayland Salinas, MD  ondansetron (ZOFRAN) 4 MG tablet Take 4 mg by mouth every 8 (eight) hours as needed for nausea or vomiting.    Historical Provider, MD  oxycodone (OXY-IR) 5 MG capsule Take 5 mg by mouth every 4 (four) hours as needed.    Historical Provider, MD  pantoprazole (PROTONIX) 40 MG tablet Take 40 mg by mouth daily.    Historical Provider, MD  polyethylene glycol (MIRALAX / GLYCOLAX) packet Take 17 g by mouth daily as needed for moderate constipation.    Historical Provider, MD  potassium chloride SA (K-DUR,KLOR-CON) 20 MEQ tablet Take 20 mEq by mouth daily. 01/17/14   Historical Provider, MD  Probiotic Product (PROBIOTIC DAILY PO) Take 1-2 tablets by mouth daily.    Historical Provider, MD  promethazine (PHENERGAN) 12.5 MG tablet Take 1-2 tablets (12.5-25 mg total) by mouth every 6 (six) hours as needed for nausea or vomiting. 03/08/14   Kela Millin, MD  sodium chloride (OCEAN) 0.65 % SOLN nasal spray Place 1 spray into both nostrils as needed for congestion. 11/07/14   Kathrynn Speed, PA-C  triamcinolone (NASACORT ALLERGY 24HR) 55 MCG/ACT AERO nasal inhaler Place 2 sprays into the nose daily as needed (for stuffy nose).     Historical Provider, MD  triamcinolone cream (KENALOG) 0.1 % Apply 1 application topically daily as needed (for dry skin).  08/17/13   Historical Provider, MD  Vitamin D, Ergocalciferol, (DRISDOL) 50000 UNITS CAPS capsule Take 50,000 Units by mouth every 7 (seven) days.    Historical Provider, MD   BP 124/77 mmHg  Pulse 96   Temp(Src) 98.5 F (36.9 C) (Oral)  Resp 18  SpO2 95%   Physical Exam  General: Well-developed, well-nourished female in no acute distress; appearance consistent with age of record HENT: normocephalic; atraumatic; left upper canine to K due to the gumline with erythema and tenderness of the adjacent gum Eyes: pupils equal, round and reactive to light; extraocular muscles intact Neck: supple; no lymphadenopathy Heart: regular rate and rhythm Lungs: Normal respiratory effort and excursion Abdomen: soft; nondistended Extremities: No deformity; full range of motion; pulses normal Neurologic: Awake, alert; motor function intact in all extremities and symmetric; no facial droop Skin: Warm and dry Psychiatric: Circumstantial speech    ED Course  Procedures (including critical care time)  DENTAL BLOCK 1.8 milliliters of 0.5% proparacaine with epinephrine were injected into the buccal fold adjacent to  the left upper canine. The patient tolerated this well and there were no immediate complications. Adequate analgesia was obtained.  MDM  Given the patient's reported allergies to virtually every class of antibiotic as well as history of this Clostridium difficile we will avoid antibiotics this time and refer to dentistry.   Paula Libra, MD 12/18/14 0345  Paula Libra, MD 12/18/14 [phone]

## 2014-12-25 ENCOUNTER — Emergency Department (HOSPITAL_BASED_OUTPATIENT_CLINIC_OR_DEPARTMENT_OTHER)
Admission: EM | Admit: 2014-12-25 | Discharge: 2014-12-25 | Disposition: A | Discharge status: Home / Self Care | Payer: Medicare Other | Attending: Emergency Medicine | Admitting: Emergency Medicine

## 2014-12-25 ENCOUNTER — Encounter (HOSPITAL_BASED_OUTPATIENT_CLINIC_OR_DEPARTMENT_OTHER): Payer: Self-pay | Admitting: *Deleted

## 2014-12-25 DIAGNOSIS — K5909 Other constipation: Secondary | ICD-10-CM | POA: Diagnosis not present

## 2014-12-25 DIAGNOSIS — Z79899 Other long term (current) drug therapy: Secondary | ICD-10-CM | POA: Insufficient documentation

## 2014-12-25 DIAGNOSIS — Z8619 Personal history of other infectious and parasitic diseases: Secondary | ICD-10-CM | POA: Diagnosis not present

## 2014-12-25 DIAGNOSIS — K5904 Chronic idiopathic constipation: Secondary | ICD-10-CM

## 2014-12-25 DIAGNOSIS — Z90710 Acquired absence of both cervix and uterus: Secondary | ICD-10-CM | POA: Insufficient documentation

## 2014-12-25 DIAGNOSIS — Z880 Allergy status to penicillin: Secondary | ICD-10-CM | POA: Diagnosis not present

## 2014-12-25 DIAGNOSIS — E119 Type 2 diabetes mellitus without complications: Secondary | ICD-10-CM | POA: Diagnosis not present

## 2014-12-25 DIAGNOSIS — Z8679 Personal history of other diseases of the circulatory system: Secondary | ICD-10-CM | POA: Insufficient documentation

## 2014-12-25 DIAGNOSIS — Z791 Long term (current) use of non-steroidal anti-inflammatories (NSAID): Secondary | ICD-10-CM | POA: Diagnosis not present

## 2014-12-25 DIAGNOSIS — K219 Gastro-esophageal reflux disease without esophagitis: Secondary | ICD-10-CM | POA: Insufficient documentation

## 2014-12-25 DIAGNOSIS — M1990 Unspecified osteoarthritis, unspecified site: Secondary | ICD-10-CM | POA: Insufficient documentation

## 2014-12-25 DIAGNOSIS — J449 Chronic obstructive pulmonary disease, unspecified: Secondary | ICD-10-CM | POA: Diagnosis not present

## 2014-12-25 DIAGNOSIS — Z9049 Acquired absence of other specified parts of digestive tract: Secondary | ICD-10-CM | POA: Diagnosis not present

## 2014-12-25 DIAGNOSIS — Z792 Long term (current) use of antibiotics: Secondary | ICD-10-CM | POA: Insufficient documentation

## 2014-12-25 DIAGNOSIS — Z8701 Personal history of pneumonia (recurrent): Secondary | ICD-10-CM | POA: Diagnosis not present

## 2014-12-25 DIAGNOSIS — R109 Unspecified abdominal pain: Secondary | ICD-10-CM | POA: Diagnosis present

## 2014-12-25 LAB — URINALYSIS, ROUTINE W REFLEX MICROSCOPIC
Bilirubin Urine: NEGATIVE
Glucose, UA: NEGATIVE mg/dL
Hgb urine dipstick: NEGATIVE
KETONES UR: NEGATIVE mg/dL
LEUKOCYTES UA: NEGATIVE
NITRITE: NEGATIVE
PROTEIN: NEGATIVE mg/dL
Specific Gravity, Urine: 1.006 (ref 1.005–1.030)
UROBILINOGEN UA: 1 mg/dL (ref 0.0–1.0)
pH: 7.5 (ref 5.0–8.0)

## 2014-12-25 LAB — RAPID URINE DRUG SCREEN, HOSP PERFORMED
Amphetamines: NOT DETECTED
BARBITURATES: NOT DETECTED
BENZODIAZEPINES: NOT DETECTED
COCAINE: NOT DETECTED
OPIATES: NOT DETECTED
Tetrahydrocannabinol: NOT DETECTED

## 2014-12-25 LAB — TROPONIN I: Troponin I: <0.03 ng/mL (ref <0.031)

## 2014-12-25 LAB — BASIC METABOLIC PANEL
Anion gap: 8 (ref 5–15)
BUN: 7 mg/dL (ref 6–23)
CALCIUM: 9.9 mg/dL (ref 8.4–10.5)
CO2: 25 mmol/L (ref 19–32)
Chloride: 107 mmol/L (ref 96–112)
Creatinine, Ser: 0.82 mg/dL (ref 0.50–1.10)
GFR, EST NON AFRICAN AMERICAN: 81 mL/min — AB (ref >90)
GLUCOSE: 104 mg/dL — AB (ref 70–99)
POTASSIUM: 3.4 mmol/L — AB (ref 3.5–5.1)
SODIUM: 140 mmol/L (ref 135–145)

## 2014-12-25 LAB — CBC
HEMATOCRIT: 36 % (ref 36.0–46.0)
HEMOGLOBIN: 11.9 g/dL — AB (ref 12.0–15.0)
MCH: 30.4 pg (ref 26.0–34.0)
MCHC: 33.1 g/dL (ref 30.0–36.0)
MCV: 91.8 fL (ref 78.0–100.0)
PLATELETS: 265 10*3/uL (ref 150–400)
RBC: 3.92 MIL/uL (ref 3.87–5.11)
RDW: 12 % (ref 11.5–15.5)
WBC: 4 10*3/uL (ref 4.0–10.5)

## 2014-12-25 MED ORDER — SENNOSIDES-DOCUSATE SODIUM 8.6-50 MG PO TABS: 1.0000 | ORAL_TABLET | Freq: Two times a day (BID) | ORAL | Status: DC

## 2014-12-25 MED ORDER — POLYETHYLENE GLYCOL 3350 17 GM/SCOOP PO POWD: 17.0000 g | Freq: Every day | ORAL | Status: DC

## 2014-12-25 MED ORDER — GLYCERIN (LAXATIVE) 2 G RE SUPP: 1.0000 | Freq: Two times a day (BID) | RECTAL | Status: DC | PRN

## 2014-12-25 MED ORDER — DICYCLOMINE HCL 10 MG/ML IM SOLN
20.0000 mg | Freq: Once | INTRAMUSCULAR | Status: AC
  Administered 2014-12-25: 20 mg via INTRAMUSCULAR
  Filled 2014-12-25: qty 2

## 2014-12-25 NOTE — ED Notes (Signed)
Patient cursing, demanding we get her a room. Patient wants antibiotics. Patient states she is going to pass out

## 2014-12-25 NOTE — ED Notes (Signed)
Patient alerted staff with call bell, that she had to urinate emergently. I entered room to assist with bedpan. Patient's boyfriend helped keep patient on bed. Patient changed her mind, wanted to urinate in small sample bottle without bedpan, and without going to restroom. Patient then demanded to be helped to floor, she removed her gown, then she proceeded to urinate on floor. Patient's boyfriend and I held steady patient and held bedpan under patient as she "squated". Patient voided approx. 600 ml's into bedpan. We Re-dressed patient and assisted patient back into gown and into bed. Side rails up and locked into position. As I i left room with bedpan to transfer urine into sample bottle, patient continued to yell at her boyfriend. Sample taken to lab.

## 2014-12-25 NOTE — Discharge Instructions (Signed)

## 2014-12-25 NOTE — ED Provider Notes (Signed)
CSN: [id number]     Arrival date & time 12/25/14  0205 History   None    Chief Complaint  Patient presents with  . Abdominal Pain     (Consider location/radiation/quality/duration/timing/severity/associated sxs/prior Treatment) Patient is a 54 y.o. female presenting with abdominal pain. The history is provided by the patient.  Abdominal Pain Pain location: distibution of the colon. Pain quality: cramping   Pain radiates to:  Does not radiate Pain severity:  Severe Onset quality:  Gradual Timing:  Constant Progression:  Worsening Chronicity:  New Context comment:  Constipation and straining to have BM Relieved by:  Nothing Ineffective treatments:  None tried Associated symptoms: constipation   Associated symptoms: no anorexia, no fever, no nausea and no sore throat   Risk factors: no alcohol abuse     Past Medical History  Diagnosis Date  . Sarcoidosis   . COPD (chronic obstructive pulmonary disease)   . Arthritis   . Acid reflux   . Mitral valve prolapse   . Diverticulosis   . Pancreatitis 1997  . Sepsis   . Pneumonia   . Sepsis   . Diabetes mellitus without complication    Past Surgical History  Procedure Laterality Date  . Cholecystectomy    . Abdominal hysterectomy     History reviewed. No pertinent family history. History  Substance Use Topics  . Smoking status: Passive Smoke Exposure - Never Smoker  . Smokeless tobacco: Never Used  . Alcohol Use: No   OB History    No data available     Review of Systems  Constitutional: Negative for fever.  HENT: Negative for sore throat.   Gastrointestinal: Positive for abdominal pain and constipation. Negative for nausea and anorexia.  All other systems reviewed and are negative.     Allergies  Amoxicillin; Avandia; Baclofen; Ceftin; Celebrex; Ciprofloxacin; Clindamycin/lincomycin; Doxycycline; Flagyl; Levaquin; Macrobid; Neurontin; Other; Sulfa antibiotics; Symbicort; Ultram; Vancomycin; Vioxx; Voltaren;  and Tape  Home Medications   Prior to Admission medications   Medication Sig Start Date End Date Taking? Authorizing Provider  albuterol (PROAIR HFA) 108 (90 BASE) MCG/ACT inhaler Inhale 2 puffs into the lungs every 6 (six) hours as needed for wheezing or shortness of breath.    Historical Provider, MD  alprazolam Prudy Feeler(XANAX) 2 MG tablet Take 2 mg by mouth 2 (two) times daily as needed for sleep or anxiety.     Historical Provider, MD  amoxicillin-clavulanate (AUGMENTIN) 875-125 MG per tablet Take 1 tablet by mouth 2 (two) times daily. 06/11/14   Kaitlyn Szekalski, PA-C  azithromycin (ZITHROMAX) 250 MG tablet Take 1 tablet (250 mg total) by mouth daily. Take first 2 tablets together, then 1 every day until finished. 11/07/14   Kathrynn Speedobyn M Hess, PA-C  cetirizine (ZYRTEC) 10 MG tablet Take 10 mg by mouth daily.    Historical Provider, MD  cyclobenzaprine (FLEXERIL) 5 MG tablet Take 5 mg by mouth 3 (three) times daily as needed for muscle spasms.    Historical Provider, MD  diclofenac sodium (VOLTAREN) 1 % GEL Apply 1 application topically 3 (three) times daily as needed (for pain).  01/18/14   Historical Provider, MD  dicyclomine (BENTYL) 10 MG capsule Take 10 mg by mouth 4 (four) times daily -  before meals and at bedtime.    Historical Provider, MD  fexofenadine (ALLEGRA) 180 MG tablet Take 180 mg by mouth daily.    Historical Provider, MD  glycerin adult (GLYCERIN ADULT) 2 G SUPP Place 1 suppository rectally 2 (two) times daily  as needed for moderate constipation. 12/25/14   Estreya Clay, MD  HYDROcodone-acetaminophen (NORCO) 5-325 MG per tablet Take 2 tablets by mouth every 4 (four) hours as needed. 03/23/14   Wayland Salinas, MD  HYDROcodone-acetaminophen (NORCO/VICODIN) 5-325 MG per tablet Take 2 tablets by mouth every 6 (six) hours as needed for moderate pain.     Historical Provider, MD  hyoscyamine (ANASPAZ) 0.125 MG TBDP disintergrating tablet Place 0.125 mg under the tongue every 4 (four) hours as needed  for bladder spasms.    Historical Provider, MD  levalbuterol [facility] HFA) 45 MCG/ACT inhaler Inhale 2 puffs into the lungs every 4 (four) hours as needed for wheezing.    Historical Provider, MD  levalbuterol Pauline Aus) 1.25 MG/3ML nebulizer solution Take 1.25 mg by nebulization every 4 (four) hours as needed for wheezing.    Historical Provider, MD  methylPREDNISolone (MEDROL DOSEPAK) 4 MG tablet Take tapering dose per package instructions. 11/29/14   John Molpus, MD  montelukast (SINGULAIR) 10 MG tablet Take 10 mg by mouth at bedtime. 12/02/13   Historical Provider, MD  nystatin cream (MYCOSTATIN) Apply to affected area 2 times daily 03/17/14   Margarita Grizzle, MD  omeprazole (PRILOSEC) 20 MG capsule Take 20 mg by mouth daily.    Historical Provider, MD  ondansetron (ZOFRAN ODT) 8 MG disintegrating tablet  ODT q4 hours prn nausea 03/23/14   Wayland Salinas, MD  ondansetron (ZOFRAN) 4 MG tablet Take 4 mg by mouth every 8 (eight) hours as needed for nausea or vomiting.    Historical Provider, MD  oxycodone (OXY-IR) 5 MG capsule Take 5 mg by mouth every 4 (four) hours as needed.    Historical Provider, MD  pantoprazole (PROTONIX) 40 MG tablet Take 40 mg by mouth daily.    Historical Provider, MD  polyethylene glycol (MIRALAX / GLYCOLAX) packet Take 17 g by mouth daily as needed for moderate constipation.    Historical Provider, MD  polyethylene glycol powder (MIRALAX) powder Take 17 g by mouth daily. 12/25/14   Swannie Milius, MD  potassium chloride SA (K-DUR,KLOR-CON) 20 MEQ tablet Take 20 mEq by mouth daily. 01/17/14   Historical Provider, MD  Probiotic Product (PROBIOTIC DAILY PO) Take 1-2 tablets by mouth daily.    Historical Provider, MD  promethazine (PHENERGAN) 12.5 MG tablet Take 1-2 tablets (12.5-25 mg total) by mouth every 6 (six) hours as needed for nausea or vomiting. 03/08/14   Kela Millin, MD  senna-docusate (SENOKOT S) 8.6-50 MG per tablet Take 1 tablet by mouth 2 (two) times daily. 12/25/14    Jadrian Bulman, MD  sodium chloride (OCEAN) 0.65 % SOLN nasal spray Place 1 spray into both nostrils as needed for congestion. 11/07/14   Kathrynn Speed, PA-C  triamcinolone (NASACORT ALLERGY 24HR) 55 MCG/ACT AERO nasal inhaler Place 2 sprays into the nose daily as needed (for stuffy nose).     Historical Provider, MD  triamcinolone cream (KENALOG) 0.1 % Apply 1 application topically daily as needed (for dry skin).  08/17/13   Historical Provider, MD  Vitamin D, Ergocalciferol, (DRISDOL) 50000 UNITS CAPS capsule Take 50,000 Units by mouth every 7 (seven) days.    Historical Provider, MD   BP 122/82 mmHg  Pulse 89  Temp(Src) 98.2 F (36.8 C) (Oral)  Resp 20  Ht  (1.651 m)  Wt 173 lb (78.472 kg)  BMI 28.79 kg/m2  SpO2 100% Physical Exam  Constitutional: She is oriented to person, place, and time. She appears well-developed and well-nourished. No distress.  Asleep upon entrance  HENT:  Head: Normocephalic and atraumatic.  Mouth/Throat: Oropharynx is clear and moist.  Eyes: Conjunctivae and EOM are normal. Pupils are equal, round, and reactive to light.  Neck: Normal range of motion. Neck supple.  Cardiovascular: Normal rate, regular rhythm and intact distal pulses.   Pulmonary/Chest: Effort normal and breath sounds normal. No respiratory distress. She has no wheezes. She has no rales.  Abdominal: Soft. Bowel sounds are increased. There is no tenderness. There is no rigidity, no rebound, no guarding, no tenderness at McBurney's point and negative Murphy's sign.  Stool palpable throughout the colon  Musculoskeletal: Normal range of motion.  Neurological: She is alert and oriented to person, place, and time.  Skin: Skin is warm and dry.  Psychiatric: She has a normal mood and affect.    ED Course  Procedures (including critical care time) Labs Review Labs Reviewed  CBC - Abnormal; Notable for the following:    Hemoglobin 11.9 (*)    All other components within normal limits  BASIC  METABOLIC PANEL - Abnormal; Notable for the following:    Potassium 3.4 (*)    Glucose, Bld 104 (*)    GFR calc non Af Amer 81 (*)    All other components within normal limits  URINALYSIS, ROUTINE W REFLEX MICROSCOPIC  URINE RAPID DRUG SCREEN (HOSP PERFORMED)  TROPONIN I    Imaging Review No results found.   EKG Interpretation None      MDM   Final diagnoses:  Constipation - functional  Was having a panic attack and crying during EKG  ED ECG REPORT   Date: 12/25/2014  Rate: 117  Rhythm: sinus tachycardia  QRS Axis: normal  Intervals: normal  ST/T Wave abnormalities: normal  Conduction Disutrbances:none  Narrative Interpretation:   Old EKG Reviewed: none available    Bowel plan initiated.  Follow up with your PMD.  Strict return precautions given   Timohty Renbarger, MD 12/25/14 [phone]

## 2014-12-25 NOTE — ED Notes (Signed)
Mentions recent visit to [facility], given augmentin for diverticulitis, concerned about sepsis. Pt alert, NAD, calm, interactive, intermittant pressured speech, resps e/u, speaking in clear complete sentences, prefers eyes closed, MAEx4, c/o multiple complaints (lists): "feel like I am going to pass out, numbness and tingling in hands,L upper dental pain,

## 2014-12-25 NOTE — ED Notes (Signed)
Given Rx x1, out in w/c, out with s.o., denies needs or questions, "feel better".

## 2014-12-25 NOTE — ED Notes (Signed)
C/o right upper abd pain that started Friday night. [facility] and has been treated with antibiotics. States she started feeling worse this morning. States she feels anxious and that she is going to "pass out" denies any fevers. C/o n/v/d.

## 2014-12-25 NOTE — ED Notes (Signed)
I stayed with patient in triage until nurse is free.

## 2014-12-25 NOTE — ED Notes (Signed)
Pt with intermittant random flight of ideas, admits to "talking out of her head", tearful and apologetic, also intermittantly angry and demanding.

## 2015-02-10 ENCOUNTER — Emergency Department (HOSPITAL_BASED_OUTPATIENT_CLINIC_OR_DEPARTMENT_OTHER): Payer: Medicare Other

## 2015-02-10 ENCOUNTER — Emergency Department (HOSPITAL_BASED_OUTPATIENT_CLINIC_OR_DEPARTMENT_OTHER)
Admission: EM | Admit: 2015-02-10 | Discharge: 2015-02-10 | Disposition: A | Discharge status: Home / Self Care | Payer: Medicare Other | Attending: Emergency Medicine | Admitting: Emergency Medicine

## 2015-02-10 ENCOUNTER — Encounter (HOSPITAL_BASED_OUTPATIENT_CLINIC_OR_DEPARTMENT_OTHER): Payer: Self-pay | Admitting: *Deleted

## 2015-02-10 DIAGNOSIS — M1990 Unspecified osteoarthritis, unspecified site: Secondary | ICD-10-CM | POA: Insufficient documentation

## 2015-02-10 DIAGNOSIS — Z79899 Other long term (current) drug therapy: Secondary | ICD-10-CM | POA: Insufficient documentation

## 2015-02-10 DIAGNOSIS — Z792 Long term (current) use of antibiotics: Secondary | ICD-10-CM | POA: Insufficient documentation

## 2015-02-10 DIAGNOSIS — K5730 Diverticulosis of large intestine without perforation or abscess without bleeding: Secondary | ICD-10-CM | POA: Insufficient documentation

## 2015-02-10 DIAGNOSIS — K219 Gastro-esophageal reflux disease without esophagitis: Secondary | ICD-10-CM | POA: Insufficient documentation

## 2015-02-10 DIAGNOSIS — Z862 Personal history of diseases of the blood and blood-forming organs and certain disorders involving the immune mechanism: Secondary | ICD-10-CM | POA: Diagnosis not present

## 2015-02-10 DIAGNOSIS — I341 Nonrheumatic mitral (valve) prolapse: Secondary | ICD-10-CM | POA: Diagnosis not present

## 2015-02-10 DIAGNOSIS — R51 Headache: Secondary | ICD-10-CM | POA: Diagnosis not present

## 2015-02-10 DIAGNOSIS — J449 Chronic obstructive pulmonary disease, unspecified: Secondary | ICD-10-CM | POA: Insufficient documentation

## 2015-02-10 DIAGNOSIS — R300 Dysuria: Secondary | ICD-10-CM | POA: Diagnosis not present

## 2015-02-10 DIAGNOSIS — E119 Type 2 diabetes mellitus without complications: Secondary | ICD-10-CM | POA: Insufficient documentation

## 2015-02-10 DIAGNOSIS — Z880 Allergy status to penicillin: Secondary | ICD-10-CM | POA: Diagnosis not present

## 2015-02-10 DIAGNOSIS — K5792 Diverticulitis of intestine, part unspecified, without perforation or abscess without bleeding: Secondary | ICD-10-CM

## 2015-02-10 DIAGNOSIS — Z8619 Personal history of other infectious and parasitic diseases: Secondary | ICD-10-CM | POA: Diagnosis not present

## 2015-02-10 DIAGNOSIS — Z8701 Personal history of pneumonia (recurrent): Secondary | ICD-10-CM | POA: Insufficient documentation

## 2015-02-10 DIAGNOSIS — R109 Unspecified abdominal pain: Secondary | ICD-10-CM | POA: Diagnosis present

## 2015-02-10 LAB — URINALYSIS, ROUTINE W REFLEX MICROSCOPIC
BILIRUBIN URINE: NEGATIVE
Glucose, UA: NEGATIVE mg/dL
HGB URINE DIPSTICK: NEGATIVE
Ketones, ur: 15 mg/dL — AB
Nitrite: NEGATIVE
PH: 5 (ref 5.0–8.0)
Protein, ur: NEGATIVE mg/dL
Specific Gravity, Urine: 1.025 (ref 1.005–1.030)
Urobilinogen, UA: 0.2 mg/dL (ref 0.0–1.0)

## 2015-02-10 LAB — URINE MICROSCOPIC-ADD ON

## 2015-02-10 MED ORDER — CIPROFLOXACIN HCL 500 MG PO TABS
500.0000 mg | ORAL_TABLET | Freq: Once | ORAL | Status: AC
  Administered 2015-02-10: 500 mg via ORAL
  Filled 2015-02-10: qty 1

## 2015-02-10 MED ORDER — METRONIDAZOLE 500 MG PO TABS
500.0000 mg | ORAL_TABLET | Freq: Two times a day (BID) | ORAL | Status: DC
Start: 2015-02-10 — End: 2015-07-11

## 2015-02-10 MED ORDER — ACETAMINOPHEN 500 MG PO TABS
1000.0000 mg | ORAL_TABLET | Freq: Once | ORAL | Status: AC
  Administered 2015-02-10: 1000 mg via ORAL
  Filled 2015-02-10: qty 2

## 2015-02-10 MED ORDER — METRONIDAZOLE 500 MG PO TABS
500.0000 mg | ORAL_TABLET | Freq: Once | ORAL | Status: AC
  Administered 2015-02-10: 500 mg via ORAL
  Filled 2015-02-10: qty 1

## 2015-02-10 MED ORDER — ONDANSETRON 8 MG PO TBDP
8.0000 mg | ORAL_TABLET | Freq: Once | ORAL | Status: AC
  Administered 2015-02-10: 8 mg via ORAL
  Filled 2015-02-10: qty 1

## 2015-02-10 MED ORDER — OXYCODONE-ACETAMINOPHEN 5-325 MG PO TABS: 1.0000 | ORAL_TABLET | Freq: Four times a day (QID) | ORAL | Status: DC | PRN

## 2015-02-10 MED ORDER — PROMETHAZINE HCL 25 MG PO TABS: 25.0000 mg | ORAL_TABLET | Freq: Four times a day (QID) | ORAL | Status: DC | PRN

## 2015-02-10 MED ORDER — CIPROFLOXACIN HCL 500 MG PO TABS: 500.0000 mg | ORAL_TABLET | Freq: Two times a day (BID) | ORAL | Status: DC

## 2015-02-10 NOTE — ED Notes (Addendum)
C/o burning and dark urine that started yesterday. C/o lower general abd pain and lower back pain. Also c/o general h/a. Denies any vaginal d/c. Denies any fevers. States she just generally does not feel well.

## 2015-02-10 NOTE — ED Provider Notes (Signed)
CSN: [id number]     Arrival date & time 02/10/15  2032 History   First MD Initiated Contact with Patient 02/10/15 2040     Chief Complaint  Patient presents with  . urinary issues      (Consider location/radiation/quality/duration/timing/severity/associated sxs/prior Treatment) HPI Comments: 54 year old female complaining of suprapubic abdominal pain radiating around to her lower back 2 days. Admits to associated dysuria, dark urine and foul-smelling urine. Reports a history of bladder infections and states this feels similar. Denies frequency or urgency. Denies fever, chills, nausea, vomiting, vaginal bleeding or discharge. Reports having 3 bob-bloody slightly loose BM yesterday. no BM today. S/p TAH, cholecystectomy. States she generally just does not feel well and has a generalized headache. She took her last OxyIR 5 mg earlier this morning with no relief.  The history is provided by the patient.    Past Medical History  Diagnosis Date  . Sarcoidosis   . COPD (chronic obstructive pulmonary disease)   . Arthritis   . Acid reflux   . Mitral valve prolapse   . Diverticulosis   . Pancreatitis 1997  . Sepsis   . Pneumonia   . Sepsis   . Diabetes mellitus without complication    Past Surgical History  Procedure Laterality Date  . Cholecystectomy    . Abdominal hysterectomy     No family history on file. History  Substance Use Topics  . Smoking status: Passive Smoke Exposure - Never Smoker  . Smokeless tobacco: Never Used  . Alcohol Use: No   OB History    No data available     Review of Systems  Gastrointestinal: Positive for abdominal pain.  Genitourinary: Positive for dysuria.  Neurological: Positive for headaches.  All other systems reviewed and are negative.     Allergies  Amoxicillin; Avandia; Baclofen; Ceftin; Celebrex; Ciprofloxacin; Clindamycin/lincomycin; Doxycycline; Flagyl; Levaquin; Macrobid; Neurontin; Other; Sulfa antibiotics; Symbicort; Ultram;  Vancomycin; Vioxx; Voltaren; and Tape  Home Medications   Prior to Admission medications   Medication Sig Start Date End Date Taking? Authorizing Provider  albuterol (PROAIR HFA) 108 (90 BASE) MCG/ACT inhaler Inhale 2 puffs into the lungs every 6 (six) hours as needed for wheezing or shortness of breath.    Historical Provider, MD  alprazolam Prudy Feeler) 2 MG tablet Take 2 mg by mouth 2 (two) times daily as needed for sleep or anxiety.     Historical Provider, MD  amoxicillin-clavulanate (AUGMENTIN) 875-125 MG per tablet Take 1 tablet by mouth 2 (two) times daily. 06/11/14   Kaitlyn Szekalski, PA-C  azithromycin (ZITHROMAX) 250 MG tablet Take 1 tablet (250 mg total) by mouth daily. Take first 2 tablets together, then 1 every day until finished. 11/07/14   Kathrynn Speed, PA-C  cetirizine (ZYRTEC) 10 MG tablet Take 10 mg by mouth daily.    Historical Provider, MD  ciprofloxacin (CIPRO) 500 MG tablet Take 1 tablet (500 mg total) by mouth 2 (two) times daily. One po bid x 7 days 02/10/15   Kathrynn Speed, PA-C  cyclobenzaprine (FLEXERIL) 5 MG tablet Take 5 mg by mouth 3 (three) times daily as needed for muscle spasms.    Historical Provider, MD  diclofenac sodium (VOLTAREN) 1 % GEL Apply 1 application topically 3 (three) times daily as needed (for pain).  01/18/14   Historical Provider, MD  dicyclomine (BENTYL) 10 MG capsule Take 10 mg by mouth 4 (four) times daily -  before meals and at bedtime.    Historical Provider, MD  fexofenadine (ALLEGRA) 180  MG tablet Take 180 mg by mouth daily.    Historical Provider, MD  glycerin adult (GLYCERIN ADULT) 2 G SUPP Place 1 suppository rectally 2 (two) times daily as needed for moderate constipation. 12/25/14   April Palumbo, MD  HYDROcodone-acetaminophen (NORCO) 5-325 MG per tablet Take 2 tablets by mouth every 4 (four) hours as needed. 03/23/14   Wayland Salinas, MD  HYDROcodone-acetaminophen (NORCO/VICODIN) 5-325 MG per tablet Take 2 tablets by mouth every 6 (six) hours as  needed for moderate pain.     Historical Provider, MD  hyoscyamine (ANASPAZ) 0.125 MG TBDP disintergrating tablet Place 0.125 mg under the tongue every 4 (four) hours as needed for bladder spasms.    Historical Provider, MD  levalbuterol [facility] HFA) 45 MCG/ACT inhaler Inhale 2 puffs into the lungs every 4 (four) hours as needed for wheezing.    Historical Provider, MD  levalbuterol Pauline Aus) 1.25 MG/3ML nebulizer solution Take 1.25 mg by nebulization every 4 (four) hours as needed for wheezing.    Historical Provider, MD  methylPREDNISolone (MEDROL DOSEPAK) 4 MG tablet Take tapering dose per package instructions. 11/29/14   John Molpus, MD  metroNIDAZOLE (FLAGYL) 500 MG tablet Take 1 tablet (500 mg total) by mouth 2 (two) times daily. One po bid x 7 days 02/10/15   Kathrynn Speed, PA-C  montelukast (SINGULAIR) 10 MG tablet Take 10 mg by mouth at bedtime. 12/02/13   Historical Provider, MD  nystatin cream (MYCOSTATIN) Apply to affected area 2 times daily 03/17/14   Margarita Grizzle, MD  omeprazole (PRILOSEC) 20 MG capsule Take 20 mg by mouth daily.    Historical Provider, MD  ondansetron (ZOFRAN ODT) 8 MG disintegrating tablet  ODT q4 hours prn nausea 03/23/14   Wayland Salinas, MD  ondansetron (ZOFRAN) 4 MG tablet Take 4 mg by mouth every 8 (eight) hours as needed for nausea or vomiting.    Historical Provider, MD  oxycodone (OXY-IR) 5 MG capsule Take 5 mg by mouth every 4 (four) hours as needed.    Historical Provider, MD  oxyCODONE-acetaminophen (PERCOCET) 5-325 MG per tablet Take 1-2 tablets by mouth every 6 (six) hours as needed for severe pain. 02/10/15   Praneel Haisley M Malesha Suliman, PA-C  pantoprazole (PROTONIX) 40 MG tablet Take 40 mg by mouth daily.    Historical Provider, MD  polyethylene glycol (MIRALAX / GLYCOLAX) packet Take 17 g by mouth daily as needed for moderate constipation.    Historical Provider, MD  polyethylene glycol powder (MIRALAX) powder Take 17 g by mouth daily. 12/25/14   April Palumbo, MD  potassium  chloride SA (K-DUR,KLOR-CON) 20 MEQ tablet Take 20 mEq by mouth daily. 01/17/14   Historical Provider, MD  Probiotic Product (PROBIOTIC DAILY PO) Take 1-2 tablets by mouth daily.    Historical Provider, MD  promethazine (PHENERGAN) 12.5 MG tablet Take 1-2 tablets (12.5-25 mg total) by mouth every 6 (six) hours as needed for nausea or vomiting. 03/08/14   Kela Millin, MD  promethazine (PHENERGAN) 25 MG tablet Take 1 tablet (25 mg total) by mouth every 6 (six) hours as needed for nausea or vomiting. 02/10/15   Kathrynn Speed, PA-C  senna-docusate (SENOKOT S) 8.6-50 MG per tablet Take 1 tablet by mouth 2 (two) times daily. 12/25/14   April Palumbo, MD  sodium chloride (OCEAN) 0.65 % SOLN nasal spray Place 1 spray into both nostrils as needed for congestion. 11/07/14   Kathrynn Speed, PA-C  triamcinolone (NASACORT ALLERGY 24HR) 55 MCG/ACT AERO nasal inhaler Place 2  sprays into the nose daily as needed (for stuffy nose).     Historical Provider, MD  triamcinolone cream (KENALOG) 0.1 % Apply 1 application topically daily as needed (for dry skin).  08/17/13   Historical Provider, MD  Vitamin D, Ergocalciferol, (DRISDOL) 50000 UNITS CAPS capsule Take 50,000 Units by mouth every 7 (seven) days.    Historical Provider, MD   BP 121/83 mmHg  Pulse 88  Temp(Src) 97.6 F (36.4 C) (Oral)  Resp 18  Ht 5\' 5"  (1.651 m)  Wt 174 lb (78.926 kg)  BMI 28.96 kg/m2  SpO2 99% Physical Exam  Constitutional: She is oriented to person, place, and time. She appears well-developed and well-nourished. No distress.  HENT:  Head: Normocephalic and atraumatic.  Mouth/Throat: Oropharynx is clear and moist.  Eyes: Conjunctivae and EOM are normal.  Neck: Normal range of motion. Neck supple.  Cardiovascular: Normal rate, regular rhythm and normal heart sounds.   Pulmonary/Chest: Effort normal and breath sounds normal. No respiratory distress.  Abdominal: Soft. Bowel sounds are normal. She exhibits no distension. There is tenderness  (suprapubic).  No CVAT. No peritoneal signs.  Musculoskeletal: Normal range of motion. She exhibits no edema.  Neurological: She is alert and oriented to person, place, and time. No sensory deficit.  Skin: Skin is warm and dry.  Psychiatric: She has a normal mood and affect. Her behavior is normal.  Nursing note and vitals reviewed.   ED Course  Procedures (including critical care time) Labs Review Labs Reviewed  URINALYSIS, ROUTINE W REFLEX MICROSCOPIC (NOT [facility]) - Abnormal; Notable for the following:    Ketones, ur 15 (*)    Leukocytes, UA SMALL (*)    All other components within normal limits  URINE MICROSCOPIC-ADD ON - Abnormal; Notable for the following:    Bacteria, UA FEW (*)    Crystals CA OXALATE CRYSTALS (*)    All other components within normal limits  URINE CULTURE    Imaging Review Ct Renal Fizer Study  02/10/2015   CLINICAL DATA:  Acute onset of dysuria and dark urine. Lower generalized abdominal pain and lower back pain. Headache. Initial encounter.  EXAM: CT ABDOMEN AND PELVIS WITHOUT CONTRAST  TECHNIQUE: Multidetector CT imaging of the abdomen and pelvis was performed following the standard protocol without IV contrast.  COMPARISON:  CT of the abdomen and pelvis from 12/22/2014  FINDINGS: The visualized lung bases are clear. A moderate hiatal hernia is seen, containing fluid and trace contrast.  The liver and spleen are unremarkable in appearance. Trace pneumobilia is thought to reflect prior instrumentation at the duodenal ampulla. The patient is status post cholecystectomy, with clips noted at the gallbladder fossa. The pancreas and adrenal glands are unremarkable.  The kidneys are unremarkable in appearance. There is no evidence of hydronephrosis. No renal or ureteral stones are seen. No perinephric stranding is appreciated.  No free fluid is identified. The small bowel is unremarkable in appearance. The stomach is within normal limits. No acute vascular  abnormalities are seen.  The appendix is normal in caliber, without evidence of appendicitis. Mild diverticulosis is noted along the descending and sigmoid colon.  Mild acute diverticulitis is noted at the proximal sigmoid colon, with mild focal soft tissue inflammation. There is no evidence of perforation or abscess formation at this time.  The bladder is mildly distended and grossly unremarkable. The patient is status post hysterectomy. No suspicious adnexal masses are seen. No inguinal lymphadenopathy is seen.  No acute osseous abnormalities are identified.  IMPRESSION: 1. Mild acute diverticulitis at the proximal sigmoid colon, with mild focal soft tissue inflammation. No evidence of perforation or abscess formation at this time. 2. Mild diverticulosis along the descending and sigmoid colon. 3. Moderate hiatal hernia, containing fluid and trace contrast. 4. Trace pneumobilia is thought to reflect prior instrumentation at the duodenal ampulla.   Electronically Signed   By: Roanna Raider M.D.   On: 02/10/2015 22:44     EKG Interpretation None      MDM   Final diagnoses:  Dysuria  Acute diverticulitis   Nontoxic appearing, NAD. AF VSS. Abdomen soft with suprapubic tenderness. No peritoneal signs. No CVA tenderness. UA negative for infection, however calcium oxalate crystals present. Given patient's symptoms, initial concern for possible kidney Purdy, and patient has a history of the same which her symptoms feel similar. CT without contrast obtained to evaluate further, no evidence of stones, however mild acute diverticulitis of the proximal sigmoid colon, no evidence of perforation or abscess. Given the patient does not have any fevers, is not in any intractable pain, she can be treated as an outpatient. After receiving Tylenol, patient reports her pain is mildly improved. Repeat abdominal exam with mild suprapubic tenderness. Will treat with Cipro and Flagyl, patient is noted to be allergic to these  medications, however her reaction is nausea and vomiting. Patient is agreeable to taking his medications as lung she has nausea medication to take. Will give Rx for Phenergan area discussed importance of PCP follow-up along with her GI specialist. Stable for discharge. Return precautions given. Patient states understanding of treatment care plan and is agreeable.  Discussed with attending Dr. Blinda Leatherwood who agrees with plan of care.   Kathrynn Speed, PA-C 02/10/15 2306  Gilda Crease, MD 02/10/15 [phone]

## 2015-02-10 NOTE — Discharge Instructions (Signed)
Take both antibiotics, cipro and flagyl twice daily for 1 week. Take percocet for severe pain only. No driving or operating heavy machinery while taking percocet. This medication may cause drowsiness. Take phenergan as directed as needed for nausea. Follow up with your primary care doctor, call to schedule an appointment as soon as possible.  Diverticulitis Diverticulitis is inflammation or infection of small pouches in your colon that form when you have a condition called diverticulosis. The pouches in your colon are called diverticula. Your colon, or large intestine, is where water is absorbed and stool is formed. Complications of diverticulitis can include:  Bleeding.  Severe infection.  Severe pain.  Perforation of your colon.  Obstruction of your colon. CAUSES  Diverticulitis is caused by bacteria. Diverticulitis happens when stool becomes trapped in diverticula. This allows bacteria to grow in the diverticula, which can lead to inflammation and infection. RISK FACTORS People with diverticulosis are at risk for diverticulitis. Eating a diet that does not include enough fiber from fruits and vegetables may make diverticulitis more likely to develop. SYMPTOMS  Symptoms of diverticulitis may include:  Abdominal pain and tenderness. The pain is normally located on the left side of the abdomen, but may occur in other areas.  Fever and chills.  Bloating.  Cramping.  Nausea.  Vomiting.  Constipation.  Diarrhea.  Blood in your stool. DIAGNOSIS  Your health care provider will ask you about your medical history and do a physical exam. You may need to have tests done because many medical conditions can cause the same symptoms as diverticulitis. Tests may include:  Blood tests.  Urine tests.  Imaging tests of the abdomen, including X-rays and CT scans. When your condition is under control, your health care provider may recommend that you have a colonoscopy. A colonoscopy can  show how severe your diverticula are and whether something else is causing your symptoms. TREATMENT  Most cases of diverticulitis are mild and can be treated at home. Treatment may include:  Taking over-the-counter pain medicines.  Following a clear liquid diet.  Taking antibiotic medicines by mouth for 7-10 days. More severe cases may be treated at a hospital. Treatment may include:  Not eating or drinking.  Taking prescription pain medicine.  Receiving antibiotic medicines through an IV tube.  Receiving fluids and nutrition through an IV tube.  Surgery. HOME CARE INSTRUCTIONS   Follow your health care provider's instructions carefully.  Follow a full liquid diet or other diet as directed by your health care provider. After your symptoms improve, your health care provider may tell you to change your diet. He or she may recommend you eat a high-fiber diet. Fruits and vegetables are good sources of fiber. Fiber makes it easier to pass stool.  Take fiber supplements or probiotics as directed by your health care provider.  Only take medicines as directed by your health care provider.  Keep all your follow-up appointments. SEEK MEDICAL CARE IF:   Your pain does not improve.  You have a hard time eating food.  Your bowel movements do not return to normal. SEEK IMMEDIATE MEDICAL CARE IF:   Your pain becomes worse.  Your symptoms do not get better.  Your symptoms suddenly get worse.  You have a fever.  You have repeated vomiting.  You have bloody or black, tarry stools. MAKE SURE YOU:   Understand these instructions.  Will watch your condition.  Will get help right away if you are not doing well or get worse. Document Released:  06/11/2005 Document Revised: 09/06/2013 Document Reviewed: 07/27/2013 [facility] LPExitCare Patient Information 2015 [place], [place]. This information is not intended to replace advice given to you by your health care provider. Make sure you discuss any  questions you have with your health care provider.

## 2015-02-12 LAB — URINE CULTURE
Colony Count: NO GROWTH
Culture: NO GROWTH

## 2015-03-05 ENCOUNTER — Emergency Department (HOSPITAL_BASED_OUTPATIENT_CLINIC_OR_DEPARTMENT_OTHER)
Admission: EM | Admit: 2015-03-05 | Discharge: 2015-03-06 | Disposition: A | Discharge status: Home / Self Care | Payer: Medicare Other | Attending: Emergency Medicine | Admitting: Emergency Medicine

## 2015-03-05 ENCOUNTER — Encounter (HOSPITAL_BASED_OUTPATIENT_CLINIC_OR_DEPARTMENT_OTHER): Payer: Self-pay | Admitting: *Deleted

## 2015-03-05 ENCOUNTER — Emergency Department (HOSPITAL_BASED_OUTPATIENT_CLINIC_OR_DEPARTMENT_OTHER): Payer: Medicare Other

## 2015-03-05 DIAGNOSIS — J449 Chronic obstructive pulmonary disease, unspecified: Secondary | ICD-10-CM | POA: Insufficient documentation

## 2015-03-05 DIAGNOSIS — K5732 Diverticulitis of large intestine without perforation or abscess without bleeding: Secondary | ICD-10-CM | POA: Diagnosis not present

## 2015-03-05 DIAGNOSIS — R000 Tachycardia, unspecified: Secondary | ICD-10-CM | POA: Insufficient documentation

## 2015-03-05 DIAGNOSIS — Z8701 Personal history of pneumonia (recurrent): Secondary | ICD-10-CM | POA: Insufficient documentation

## 2015-03-05 DIAGNOSIS — Z79899 Other long term (current) drug therapy: Secondary | ICD-10-CM | POA: Insufficient documentation

## 2015-03-05 DIAGNOSIS — Z90710 Acquired absence of both cervix and uterus: Secondary | ICD-10-CM | POA: Diagnosis not present

## 2015-03-05 DIAGNOSIS — Z8619 Personal history of other infectious and parasitic diseases: Secondary | ICD-10-CM | POA: Insufficient documentation

## 2015-03-05 DIAGNOSIS — Z8679 Personal history of other diseases of the circulatory system: Secondary | ICD-10-CM | POA: Insufficient documentation

## 2015-03-05 DIAGNOSIS — K5733 Diverticulitis of large intestine without perforation or abscess with bleeding: Secondary | ICD-10-CM

## 2015-03-05 DIAGNOSIS — Z862 Personal history of diseases of the blood and blood-forming organs and certain disorders involving the immune mechanism: Secondary | ICD-10-CM | POA: Insufficient documentation

## 2015-03-05 DIAGNOSIS — R109 Unspecified abdominal pain: Secondary | ICD-10-CM

## 2015-03-05 DIAGNOSIS — M1990 Unspecified osteoarthritis, unspecified site: Secondary | ICD-10-CM | POA: Insufficient documentation

## 2015-03-05 DIAGNOSIS — Z9049 Acquired absence of other specified parts of digestive tract: Secondary | ICD-10-CM | POA: Insufficient documentation

## 2015-03-05 DIAGNOSIS — K219 Gastro-esophageal reflux disease without esophagitis: Secondary | ICD-10-CM | POA: Insufficient documentation

## 2015-03-05 DIAGNOSIS — E119 Type 2 diabetes mellitus without complications: Secondary | ICD-10-CM | POA: Insufficient documentation

## 2015-03-05 DIAGNOSIS — Z792 Long term (current) use of antibiotics: Secondary | ICD-10-CM | POA: Diagnosis not present

## 2015-03-05 DIAGNOSIS — Z880 Allergy status to penicillin: Secondary | ICD-10-CM | POA: Diagnosis not present

## 2015-03-05 DIAGNOSIS — R51 Headache: Secondary | ICD-10-CM | POA: Diagnosis present

## 2015-03-05 LAB — COMPREHENSIVE METABOLIC PANEL
ALT: 32 U/L (ref 14–54)
ANION GAP: 10 (ref 5–15)
AST: 28 U/L (ref 15–41)
Albumin: 4.5 g/dL (ref 3.5–5.0)
Alkaline Phosphatase: 105 U/L (ref 38–126)
BILIRUBIN TOTAL: 0.6 mg/dL (ref 0.3–1.2)
BUN: 6 mg/dL (ref 6–20)
CALCIUM: 9.4 mg/dL (ref 8.9–10.3)
CHLORIDE: 100 mmol/L — AB (ref 101–111)
CO2: 26 mmol/L (ref 22–32)
CREATININE: 0.63 mg/dL (ref 0.44–1.00)
GFR calc non Af Amer: >60 mL/min (ref >60)
Glucose, Bld: 99 mg/dL (ref 65–99)
Potassium: 3.6 mmol/L (ref 3.5–5.1)
Sodium: 136 mmol/L (ref 135–145)
Total Protein: 7.7 g/dL (ref 6.5–8.1)

## 2015-03-05 LAB — URINALYSIS, ROUTINE W REFLEX MICROSCOPIC
BILIRUBIN URINE: NEGATIVE
GLUCOSE, UA: NEGATIVE mg/dL
HGB URINE DIPSTICK: NEGATIVE
Ketones, ur: NEGATIVE mg/dL
Leukocytes, UA: NEGATIVE
Nitrite: NEGATIVE
Protein, ur: NEGATIVE mg/dL
SPECIFIC GRAVITY, URINE: 1.004 — AB (ref 1.005–1.030)
UROBILINOGEN UA: 0.2 mg/dL (ref 0.0–1.0)
pH: 6 (ref 5.0–8.0)

## 2015-03-05 LAB — CBC WITH DIFFERENTIAL/PLATELET
Basophils Absolute: 0 10*3/uL (ref 0.0–0.1)
Basophils Relative: 0 % (ref 0–1)
Eosinophils Absolute: 0 10*3/uL (ref 0.0–0.7)
Eosinophils Relative: 0 % (ref 0–5)
HEMATOCRIT: 38.7 % (ref 36.0–46.0)
Hemoglobin: 12.8 g/dL (ref 12.0–15.0)
LYMPHS ABS: 2.2 10*3/uL (ref 0.7–4.0)
LYMPHS PCT: 24 % (ref 12–46)
MCH: 30.8 pg (ref 26.0–34.0)
MCHC: 33.1 g/dL (ref 30.0–36.0)
MCV: 93.3 fL (ref 78.0–100.0)
MONO ABS: 1 10*3/uL (ref 0.1–1.0)
Monocytes Relative: 11 % (ref 3–12)
NEUTROS ABS: 5.9 10*3/uL (ref 1.7–7.7)
Neutrophils Relative %: 65 % (ref 43–77)
Platelets: 225 10*3/uL (ref 150–400)
RBC: 4.15 MIL/uL (ref 3.87–5.11)
RDW: 12 % (ref 11.5–15.5)
WBC: 9 10*3/uL (ref 4.0–10.5)

## 2015-03-05 LAB — I-STAT CG4 LACTIC ACID, ED: Lactic Acid, Venous: 0.67 mmol/L (ref 0.5–2.0)

## 2015-03-05 LAB — LIPASE, BLOOD: Lipase: 15 U/L — ABNORMAL LOW (ref 22–51)

## 2015-03-05 MED ORDER — ONDANSETRON HCL 4 MG/2ML IJ SOLN
4.0000 mg | Freq: Once | INTRAMUSCULAR | Status: DC
  Filled 2015-03-05: qty 2

## 2015-03-05 MED ORDER — SODIUM CHLORIDE 0.9 % IV BOLUS (SEPSIS)
1000.0000 mL | Freq: Once | INTRAVENOUS | Status: AC
  Administered 2015-03-05: 1000 mL via INTRAVENOUS

## 2015-03-05 MED ORDER — IOHEXOL 300 MG/ML  SOLN
50.0000 mL | Freq: Once | INTRAMUSCULAR | Status: AC | PRN
Start: 2015-03-05 — End: 2015-03-05
  Administered 2015-03-05: 50 mL via ORAL

## 2015-03-05 MED ORDER — HYDROMORPHONE HCL 1 MG/ML IJ SOLN
1.0000 mg | Freq: Once | INTRAMUSCULAR | Status: AC
  Administered 2015-03-05: 1 mg via INTRAVENOUS
  Filled 2015-03-05: qty 1

## 2015-03-05 MED ORDER — PROMETHAZINE HCL 25 MG/ML IJ SOLN
12.5000 mg | Freq: Once | INTRAMUSCULAR | Status: AC
Start: 2015-03-05 — End: 2015-03-05
  Administered 2015-03-05: 12.5 mg via INTRAVENOUS
  Filled 2015-03-05: qty 1

## 2015-03-05 MED ORDER — IOHEXOL 300 MG/ML  SOLN
100.0000 mL | Freq: Once | INTRAMUSCULAR | Status: AC | PRN
  Administered 2015-03-05: 100 mL via INTRAVENOUS

## 2015-03-05 NOTE — ED Notes (Signed)
Patient states "I need some phenergan".  Explained to patient that at this time we would try zofran as phenergan is a very sedating medication.  Patient states "I cannot take zofran because it makes me crazy.  I need phenergan".  MD made aware.

## 2015-03-05 NOTE — ED Notes (Signed)
Headache and chills. Aching all over.

## 2015-03-05 NOTE — ED Notes (Addendum)
Patient reports fever/chills, eye pain, headache, neck pain, shoulder pain, RLQ abdominal pain as well as bilateral flank pain and leg pain.  Reports this began last night.  Reports hx of diverticulitis and states she has had some diarrhea as well.

## 2015-03-05 NOTE — ED Notes (Signed)
Attempted IV with myself and steve, RT at bedside.  Unsuccessful attempts x 3.  MD made aware.

## 2015-03-05 NOTE — ED Provider Notes (Addendum)
CSN: [id number]     Arrival date & time 03/05/15  1926 History  This chart was scribed for Holly Sprout, MD by Octavia Heir, ED Scribe. This patient was seen in room MH04/MH04 and the patient's care was started at 8:30 PM.    Chief Complaint  Patient presents with  . Headache      The history is provided by the patient. No language interpreter was used.   HPI Comments: Holly Rogers is a 54 y.o. female who has a hx of diverticulitis presents to the Emergency Department complaining of a constant, gradual worsening headache onset one week ago with her associated chronic ongoing issues with right sided diverticulitis. Pt notes having associated chills, neck stiffness, neck pain and bilateral shoulder pain.  She also reports having small blood in her stool. Pt states that the month of march she was on cipro/flagyl for ongoing diverticulitis but has been calling her GI doctor and unable to get an appt until June 30th.  They called in bentyl which she states does not work.   She notes going through about 55 depends since last week. Per husband, her stools have been black diarrhea. Pt denies dysuria, foul odor smelling urine, vomiting, iron, and pepto bismal.   Nausea without vomiting.  No fever but chills.  Pt states she usually gets headaches with her stomach issues.  Started after eating fried chicken.  Past Medical History  Diagnosis Date  . Sarcoidosis   . COPD (chronic obstructive pulmonary disease)   . Arthritis   . Acid reflux   . Mitral valve prolapse   . Diverticulosis   . Pancreatitis 1997  . Sepsis   . Pneumonia   . Sepsis   . Diabetes mellitus without complication    Past Surgical History  Procedure Laterality Date  . Cholecystectomy    . Abdominal hysterectomy     No family history on file. History  Substance Use Topics  . Smoking status: Passive Smoke Exposure - Never Smoker  . Smokeless tobacco: Never Used  . Alcohol Use: No   OB History    No data available      Review of Systems  A complete 10 system review of systems was obtained and all systems are negative except as noted in the HPI and PMH.    Allergies  Amoxicillin; Avandia; Baclofen; Ceftin; Celebrex; Ciprofloxacin; Clindamycin/lincomycin; Doxycycline; Flagyl; Levaquin; Macrobid; Neurontin; Other; Sulfa antibiotics; Symbicort; Ultram; Vancomycin; Vioxx; Voltaren; and Tape  Home Medications   Prior to Admission medications   Medication Sig Start Date End Date Taking? Authorizing Provider  albuterol (PROAIR HFA) 108 (90 BASE) MCG/ACT inhaler Inhale 2 puffs into the lungs every 6 (six) hours as needed for wheezing or shortness of breath.    Historical Provider, MD  alprazolam Prudy Feeler) 2 MG tablet Take 2 mg by mouth 2 (two) times daily as needed for sleep or anxiety.     Historical Provider, MD  amoxicillin-clavulanate (AUGMENTIN) 875-125 MG per tablet Take 1 tablet by mouth 2 (two) times daily. 06/11/14   Kaitlyn Szekalski, PA-C  azithromycin (ZITHROMAX) 250 MG tablet Take 1 tablet (250 mg total) by mouth daily. Take first 2 tablets together, then 1 every day until finished. 11/07/14   Kathrynn Speed, PA-C  cetirizine (ZYRTEC) 10 MG tablet Take 10 mg by mouth daily.    Historical Provider, MD  ciprofloxacin (CIPRO) 500 MG tablet Take 1 tablet (500 mg total) by mouth 2 (two) times daily. One po bid x 7 days 02/10/15  Robyn M Hess, PA-C  cyclobenzaprine (FLEXERIL) 5 MG tablet Take 5 mg by mouth 3 (three) times daily as needed for muscle spasms.    Historical Provider, MD  diclofenac sodium (VOLTAREN) 1 % GEL Apply 1 application topically 3 (three) times daily as needed (for pain).  01/18/14   Historical Provider, MD  dicyclomine (BENTYL) 10 MG capsule Take 10 mg by mouth 4 (four) times daily -  before meals and at bedtime.    Historical Provider, MD  fexofenadine (ALLEGRA) 180 MG tablet Take 180 mg by mouth daily.    Historical Provider, MD  glycerin adult (GLYCERIN ADULT) 2 G SUPP Place 1 suppository  rectally 2 (two) times daily as needed for moderate constipation. 12/25/14   April Palumbo, MD  HYDROcodone-acetaminophen (NORCO) 5-325 MG per tablet Take 2 tablets by mouth every 4 (four) hours as needed. 03/23/14   Wayland Salinas, MD  HYDROcodone-acetaminophen (NORCO/VICODIN) 5-325 MG per tablet Take 2 tablets by mouth every 6 (six) hours as needed for moderate pain.     Historical Provider, MD  hyoscyamine (ANASPAZ) 0.125 MG TBDP disintergrating tablet Place 0.125 mg under the tongue every 4 (four) hours as needed for bladder spasms.    Historical Provider, MD  levalbuterol [facility] HFA) 45 MCG/ACT inhaler Inhale 2 puffs into the lungs every 4 (four) hours as needed for wheezing.    Historical Provider, MD  levalbuterol Pauline Aus) 1.25 MG/3ML nebulizer solution Take 1.25 mg by nebulization every 4 (four) hours as needed for wheezing.    Historical Provider, MD  methylPREDNISolone (MEDROL DOSEPAK) 4 MG tablet Take tapering dose per package instructions. 11/29/14   John Molpus, MD  metroNIDAZOLE (FLAGYL) 500 MG tablet Take 1 tablet (500 mg total) by mouth 2 (two) times daily. One po bid x 7 days 02/10/15   Kathrynn Speed, PA-C  montelukast (SINGULAIR) 10 MG tablet Take 10 mg by mouth at bedtime. 12/02/13   Historical Provider, MD  nystatin cream (MYCOSTATIN) Apply to affected area 2 times daily 03/17/14   Margarita Grizzle, MD  omeprazole (PRILOSEC) 20 MG capsule Take 20 mg by mouth daily.    Historical Provider, MD  ondansetron (ZOFRAN ODT) 8 MG disintegrating tablet  ODT q4 hours prn nausea 03/23/14   Wayland Salinas, MD  ondansetron (ZOFRAN) 4 MG tablet Take 4 mg by mouth every 8 (eight) hours as needed for nausea or vomiting.    Historical Provider, MD  oxycodone (OXY-IR) 5 MG capsule Take 5 mg by mouth every 4 (four) hours as needed.    Historical Provider, MD  oxyCODONE-acetaminophen (PERCOCET) 5-325 MG per tablet Take 1-2 tablets by mouth every 6 (six) hours as needed for severe pain. 02/10/15   Robyn M Hess, PA-C   pantoprazole (PROTONIX) 40 MG tablet Take 40 mg by mouth daily.    Historical Provider, MD  polyethylene glycol (MIRALAX / GLYCOLAX) packet Take 17 g by mouth daily as needed for moderate constipation.    Historical Provider, MD  polyethylene glycol powder (MIRALAX) powder Take 17 g by mouth daily. 12/25/14   April Palumbo, MD  potassium chloride SA (K-DUR,KLOR-CON) 20 MEQ tablet Take 20 mEq by mouth daily. 01/17/14   Historical Provider, MD  Probiotic Product (PROBIOTIC DAILY PO) Take 1-2 tablets by mouth daily.    Historical Provider, MD  promethazine (PHENERGAN) 12.5 MG tablet Take 1-2 tablets (12.5-25 mg total) by mouth every 6 (six) hours as needed for nausea or vomiting. 03/08/14   Kela Millin, MD  promethazine (PHENERGAN) 25  MG tablet Take 1 tablet (25 mg total) by mouth every 6 (six) hours as needed for nausea or vomiting. 02/10/15   Kathrynn Speed, PA-C  senna-docusate (SENOKOT S) 8.6-50 MG per tablet Take 1 tablet by mouth 2 (two) times daily. 12/25/14   April Palumbo, MD  sodium chloride (OCEAN) 0.65 % SOLN nasal spray Place 1 spray into both nostrils as needed for congestion. 11/07/14   Kathrynn Speed, PA-C  triamcinolone (NASACORT ALLERGY 24HR) 55 MCG/ACT AERO nasal inhaler Place 2 sprays into the nose daily as needed (for stuffy nose).     Historical Provider, MD  triamcinolone cream (KENALOG) 0.1 % Apply 1 application topically daily as needed (for dry skin).  08/17/13   Historical Provider, MD  Vitamin D, Ergocalciferol, (DRISDOL) 50000 UNITS CAPS capsule Take 50,000 Units by mouth every 7 (seven) days.    Historical Provider, MD   Triage vitals: BP 127/88 mmHg  Pulse 120  Temp(Src) 98.2 F (36.8 C) (Oral)  Resp 18  Ht  (1.651 m)  Wt 173 lb (78.472 kg)  BMI 28.79 kg/m2  SpO2 97% Physical Exam  Constitutional: She is oriented to person, place, and time. She appears well-developed and well-nourished. No distress.  HENT:  Head: Normocephalic.  Eyes: Conjunctivae are normal.  Pupils are equal, round, and reactive to light. No scleral icterus.  Neck: Normal range of motion. Neck supple. No thyromegaly present.  No meningismus able to completely range her neck without discomfort when distracted, no lymphadenophathy  Cardiovascular: Regular rhythm.  Tachycardia present.  Exam reveals no gallop and no friction rub.   No murmur heard. Pulmonary/Chest: Effort normal and breath sounds normal. No respiratory distress. She has no wheezes. She has no rales.  Abdominal: Soft. Bowel sounds are normal. She exhibits no distension. There is no tenderness. There is no rebound.  Diffuse abdominal tenderness without rebound or guarding   Musculoskeletal: Normal range of motion.  Neurological: She is alert and oriented to person, place, and time.  Skin: Skin is warm and dry. No rash noted.  Psychiatric: She has a normal mood and affect. Her behavior is normal.  Nursing note and vitals reviewed.   ED Course  Procedures  DIAGNOSTIC STUDIES: Oxygen Saturation is 97% on RA, normal by my interpretation.  COORDINATION OF CARE:  8:39 PM Discussed treatment plan which includes pain medication, blood work, CT scan with pt at bedside and pt agreed to plan.  Labs Review Labs Reviewed  URINALYSIS, ROUTINE W REFLEX MICROSCOPIC (NOT AT [facility]) - Abnormal; Notable for the following:    Specific Gravity, Urine 1.004 (*)    All other components within normal limits  COMPREHENSIVE METABOLIC PANEL - Abnormal; Notable for the following:    Chloride 100 (*)    All other components within normal limits  LIPASE, BLOOD - Abnormal; Notable for the following:    Lipase 15 (*)    All other components within normal limits  CBC WITH DIFFERENTIAL/PLATELET  I-STAT CG4 LACTIC ACID, ED    Imaging Review Ct Abdomen Pelvis W Contrast  03/05/2015   CLINICAL DATA:  Patient with headache, lower abdominal pain and blood in stool. Being treated for diverticulitis.  EXAM: CT ABDOMEN AND PELVIS WITH CONTRAST   TECHNIQUE: Multidetector CT imaging of the abdomen and pelvis was performed using the standard protocol following bolus administration of intravenous contrast.  CONTRAST:  50mL OMNIPAQUE IOHEXOL 300 MG/ML SOLN, OMNIPAQUE IOHEXOL 300 MG/ML SOLN  COMPARISON:  CT abdomen pelvis 02/16/2015  FINDINGS:  Lower chest: Minimal dependent atelectasis. Normal heart size. Small hiatal hernia.  Hepatobiliary: Liver is normal in size and contour. Patient status post cholecystectomy.  Pancreas: Unremarkable  Spleen: Unremarkable  Adrenals/Urinary Tract: Normal adrenal glands. Kidneys enhance symmetrically with contrast. No hydronephrosis. Urinary bladder is unremarkable.  Stomach/Bowel: Sigmoid colonic diverticulosis. Continued interval improvement and near complete resolution of previously described inflammatory change about the sigmoid colon. There has been interval development of circumferential wall thickening and surrounding fat stranding about the distal transverse colon (image 35; series 2). Normal appendix. No evidence for small bowel obstruction. No free fluid or free intraperitoneal air.  Vascular/Lymphatic: Normal caliber abdominal aorta. No retroperitoneal lymphadenopathy.  Other: None  Musculoskeletal: No aggressive appearing osseous lesions.  IMPRESSION: Interval development of focal circumferential wall thickening involving the distal transverse colon most compatible with diverticulitis. If not previously performed, consider correlation with colonoscopy after resolution of the acute symptomatology to exclude the possibility of underlying lesion.   Electronically Signed   By: Annia Belt M.D.   On: 03/05/2015 23:54     EKG Interpretation None      MDM   Final diagnoses:  Abdominal pain  Diverticulitis of large intestine without perforation or abscess with bleeding    Patient with a hx of COPD, pancreatitis, diverticulosis, pneumonia, diabetes, sarcoidosis who presenting with a complaint of ongoing  intermittent right-sided abdominal tenderness, diarrhea, nausea without vomiting, headache which started again today. Patient was diagnosed with diverticulitis at the end of May and took Cipro and Flagyl for 2 weeks with improvement of her symptoms but now they have started again after she ate fried chicken Saturday. She is having liquid stools and pain over the right abdomen and back which she states is typical of her diverticulitis. She has been calling her gastroenterologist but is unable to get an appointment until June 30. They called her in bentyl but she states that does not work and she has tried it before.  Patient is complaining of a headache however she has not had a documented fever she has full range of motion with her neck without any signs of meningismus. No rashes or other abnormalities concerning for document was fever. Fever may be due to dehydration and she is eating and drinking less.  She has right abdominal tenderness on exam but no guarding or rebound. Status post cholecystectomy and denies any alcohol use.  11:23 PM Labs all wnl.  Ct pending.  12:00 AM Ct shows new diverticulitis.  Pt currently has normal labs.  Multiple medication allergies and states she just does not tolerate cipro/flagyl.   Pt does not have allergy to avelox so will start that.  She is not vomiting here and tolerating po's.  At this time no indication for admission.  Sent home with pain meds, abx and encourage to f/u with [facility].  I personally performed the services described in this documentation, which was scribed in my presence.  The recorded information has been reviewed and considered.    Holly Sprout, MD 03/06/15 [id number]  Holly Sprout, MD 03/06/15 [id number]

## 2015-03-06 DIAGNOSIS — K5732 Diverticulitis of large intestine without perforation or abscess without bleeding: Secondary | ICD-10-CM | POA: Diagnosis not present

## 2015-03-06 MED ORDER — OXYCODONE-ACETAMINOPHEN 5-325 MG PO TABS: 1.0000 | ORAL_TABLET | Freq: Four times a day (QID) | ORAL | Status: DC | PRN

## 2015-03-06 MED ORDER — HYDROMORPHONE HCL 1 MG/ML IJ SOLN
1.0000 mg | Freq: Once | INTRAMUSCULAR | Status: AC
  Administered 2015-03-06: 1 mg via INTRAVENOUS
  Filled 2015-03-06: qty 1

## 2015-03-06 MED ORDER — MOXIFLOXACIN HCL 400 MG PO TABS: 400.0000 mg | ORAL_TABLET | Freq: Every day | ORAL | Status: DC

## 2015-03-06 MED ORDER — PROMETHAZINE HCL 25 MG PO TABS: 25.0000 mg | ORAL_TABLET | Freq: Four times a day (QID) | ORAL | Status: DC | PRN

## 2015-03-07 DIAGNOSIS — J45909 Unspecified asthma, uncomplicated: Secondary | ICD-10-CM | POA: Insufficient documentation

## 2015-03-07 DIAGNOSIS — K219 Gastro-esophageal reflux disease without esophagitis: Secondary | ICD-10-CM | POA: Insufficient documentation

## 2015-03-07 DIAGNOSIS — M797 Fibromyalgia: Secondary | ICD-10-CM | POA: Insufficient documentation

## 2015-03-07 DIAGNOSIS — R079 Chest pain, unspecified: Secondary | ICD-10-CM | POA: Insufficient documentation

## 2015-03-13 ENCOUNTER — Emergency Department (HOSPITAL_BASED_OUTPATIENT_CLINIC_OR_DEPARTMENT_OTHER): Payer: Medicare Other

## 2015-03-13 ENCOUNTER — Emergency Department (HOSPITAL_BASED_OUTPATIENT_CLINIC_OR_DEPARTMENT_OTHER)
Admission: EM | Admit: 2015-03-13 | Discharge: 2015-03-13 | Disposition: A | Discharge status: Home / Self Care | Payer: Medicare Other | Attending: Emergency Medicine | Admitting: Emergency Medicine

## 2015-03-13 ENCOUNTER — Encounter (HOSPITAL_BASED_OUTPATIENT_CLINIC_OR_DEPARTMENT_OTHER): Payer: Self-pay | Admitting: *Deleted

## 2015-03-13 DIAGNOSIS — K219 Gastro-esophageal reflux disease without esophagitis: Secondary | ICD-10-CM | POA: Insufficient documentation

## 2015-03-13 DIAGNOSIS — Z8679 Personal history of other diseases of the circulatory system: Secondary | ICD-10-CM | POA: Diagnosis not present

## 2015-03-13 DIAGNOSIS — J449 Chronic obstructive pulmonary disease, unspecified: Secondary | ICD-10-CM | POA: Diagnosis not present

## 2015-03-13 DIAGNOSIS — Z880 Allergy status to penicillin: Secondary | ICD-10-CM | POA: Insufficient documentation

## 2015-03-13 DIAGNOSIS — M1990 Unspecified osteoarthritis, unspecified site: Secondary | ICD-10-CM | POA: Insufficient documentation

## 2015-03-13 DIAGNOSIS — Z760 Encounter for issue of repeat prescription: Secondary | ICD-10-CM

## 2015-03-13 DIAGNOSIS — Z8619 Personal history of other infectious and parasitic diseases: Secondary | ICD-10-CM | POA: Diagnosis not present

## 2015-03-13 DIAGNOSIS — E119 Type 2 diabetes mellitus without complications: Secondary | ICD-10-CM | POA: Insufficient documentation

## 2015-03-13 DIAGNOSIS — Z793 Long term (current) use of hormonal contraceptives: Secondary | ICD-10-CM | POA: Insufficient documentation

## 2015-03-13 DIAGNOSIS — Z862 Personal history of diseases of the blood and blood-forming organs and certain disorders involving the immune mechanism: Secondary | ICD-10-CM | POA: Insufficient documentation

## 2015-03-13 DIAGNOSIS — J189 Pneumonia, unspecified organism: Secondary | ICD-10-CM | POA: Diagnosis not present

## 2015-03-13 DIAGNOSIS — R197 Diarrhea, unspecified: Secondary | ICD-10-CM | POA: Diagnosis not present

## 2015-03-13 LAB — URINALYSIS, ROUTINE W REFLEX MICROSCOPIC
Bilirubin Urine: NEGATIVE
Glucose, UA: NEGATIVE mg/dL
Hgb urine dipstick: NEGATIVE
Ketones, ur: NEGATIVE mg/dL
Nitrite: NEGATIVE
Protein, ur: NEGATIVE mg/dL
Specific Gravity, Urine: 1.02 (ref 1.005–1.030)
Urobilinogen, UA: 0.2 mg/dL (ref 0.0–1.0)
pH: 5.5 (ref 5.0–8.0)

## 2015-03-13 LAB — URINE MICROSCOPIC-ADD ON

## 2015-03-13 MED ORDER — VANCOMYCIN HCL 125 MG PO CAPS: 125.0000 mg | ORAL_CAPSULE | Freq: Four times a day (QID) | ORAL | Status: DC

## 2015-03-13 MED ORDER — FLUCONAZOLE 150 MG PO TABS: 150.0000 mg | ORAL_TABLET | Freq: Once | ORAL | Status: DC

## 2015-03-13 NOTE — Discharge Instructions (Signed)
Medication Refill, Emergency Department °We have refilled your medication today as a courtesy to you. It is best for your medical care, however, to take care of getting refills done through your primary caregiver's office. They have your records and can do a better job of follow-up than we can in the emergency department. °On maintenance medications, we often only prescribe enough medications to get you by until you are able to see your regular caregiver. This is a more expensive way to refill medications. °In the future, please plan for refills so that you will not have to use the emergency department for this. °Thank you for your help. Your help allows us to better take care of the daily emergencies that enter our department. °Document Released: 12/19/2003 Document Revised: 11/24/2011 Document Reviewed: 12/09/2013 °ExitCare® Patient Information ©2015 [employer]. This information is not intended to replace advice given to you by your health care provider. Make sure you discuss any questions you have with your health care provider. ° °

## 2015-03-13 NOTE — ED Provider Notes (Addendum)
CSN: [id number]     Arrival date & time 03/13/15  0119 History   First MD Initiated Contact with Patient 03/13/15 0210     Chief Complaint  Patient presents with  . Medication Refill     (Consider location/radiation/quality/duration/timing/severity/associated sxs/prior Treatment) Patient is a 54 y.o. female presenting with diarrhea. The history is provided by the patient.  Diarrhea Quality:  Semi-solid Severity:  Moderate Onset quality:  Gradual Timing:  Intermittent Progression:  Improving Relieved by:  Nothing Worsened by:  Nothing tried Ineffective treatments:  None tried Associated symptoms: no abdominal pain, no fever and no vomiting   Risk factors: no suspicious food intake   Patient was diagnosed with diverticulitis on 6/20 here and was started on Avelox and Flagyl.  [facility] and stated she was allergic to flagyl.  Tested positive for C diff and was started on PO vancomycin.  Had a CT on 6/22 showing improvement in her diverticulitis.  [facility] but the PO vanc and diflucan were not called in.  She is here requesting these RX  Past Medical History  Diagnosis Date  . Sarcoidosis   . COPD (chronic obstructive pulmonary disease)   . Arthritis   . Acid reflux   . Mitral valve prolapse   . Diverticulosis   . Pancreatitis 1997  . Sepsis   . Pneumonia   . Sepsis   . Diabetes mellitus without complication    Past Surgical History  Procedure Laterality Date  . Cholecystectomy    . Abdominal hysterectomy     No family history on file. History  Substance Use Topics  . Smoking status: Passive Smoke Exposure - Never Smoker  . Smokeless tobacco: Never Used  . Alcohol Use: No   OB History    No data available     Review of Systems  Constitutional: Negative for fever.  Respiratory: Negative for stridor.   Cardiovascular: Negative for chest pain.  Gastrointestinal: Positive for diarrhea. Negative for nausea,  vomiting and abdominal pain.  All other systems reviewed and are negative.     Allergies  Amoxicillin; Avandia; Baclofen; Ceftin; Celebrex; Ciprofloxacin; Clindamycin/lincomycin; Doxycycline; Flagyl; Levaquin; Macrobid; Neurontin; Other; Sulfa antibiotics; Symbicort; Ultram; Vancomycin; Vioxx; Voltaren; and Tape  Home Medications   Prior to Admission medications   Medication Sig Start Date End Date Taking? Authorizing Provider  albuterol (PROAIR HFA) 108 (90 BASE) MCG/ACT inhaler Inhale 2 puffs into the lungs every 6 (six) hours as needed for wheezing or shortness of breath.    Historical Provider, MD  alprazolam Prudy Feeler(XANAX) 2 MG tablet Take 2 mg by mouth 2 (two) times daily as needed for sleep or anxiety.     Historical Provider, MD  amoxicillin-clavulanate (AUGMENTIN) 875-125 MG per tablet Take 1 tablet by mouth 2 (two) times daily. 06/11/14   Kaitlyn Szekalski, PA-C  azithromycin (ZITHROMAX) 250 MG tablet Take 1 tablet (250 mg total) by mouth daily. Take first 2 tablets together, then 1 every day until finished. 11/07/14   Kathrynn Speedobyn M Hess, PA-C  cetirizine (ZYRTEC) 10 MG tablet Take 10 mg by mouth daily.    Historical Provider, MD  ciprofloxacin (CIPRO) 500 MG tablet Take 1 tablet (500 mg total) by mouth 2 (two) times daily. One po bid x 7 days 02/10/15   Kathrynn Speedobyn M Hess, PA-C  cyclobenzaprine (FLEXERIL) 5 MG tablet Take 5 mg by mouth 3 (three) times daily as needed for muscle spasms.    Historical Provider, MD  diclofenac sodium (VOLTAREN) 1 % GEL Apply 1 application topically 3 (three) times daily as needed (for pain).  01/18/14   Historical Provider, MD  dicyclomine (BENTYL) 10 MG capsule Take 10 mg by mouth 4 (four) times daily -  before meals and at bedtime.    Historical Provider, MD  fexofenadine (ALLEGRA) 180 MG tablet Take 180 mg by mouth daily.    Historical Provider, MD  glycerin adult (GLYCERIN ADULT) 2 G SUPP Place 1 suppository rectally 2 (two) times daily as needed for moderate  constipation. 12/25/14   Luzelena Heeg, MD  HYDROcodone-acetaminophen (NORCO) 5-325 MG per tablet Take 2 tablets by mouth every 4 (four) hours as needed. 03/23/14   Wayland Salinas, MD  HYDROcodone-acetaminophen (NORCO/VICODIN) 5-325 MG per tablet Take 2 tablets by mouth every 6 (six) hours as needed for moderate pain.     Historical Provider, MD  hyoscyamine (ANASPAZ) 0.125 MG TBDP disintergrating tablet Place 0.125 mg under the tongue every 4 (four) hours as needed for bladder spasms.    Historical Provider, MD  levalbuterol [facility] HFA) 45 MCG/ACT inhaler Inhale 2 puffs into the lungs every 4 (four) hours as needed for wheezing.    Historical Provider, MD  levalbuterol Pauline Aus) 1.25 MG/3ML nebulizer solution Take 1.25 mg by nebulization every 4 (four) hours as needed for wheezing.    Historical Provider, MD  methylPREDNISolone (MEDROL DOSEPAK) 4 MG tablet Take tapering dose per package instructions. 11/29/14   John Molpus, MD  metroNIDAZOLE (FLAGYL) 500 MG tablet Take 1 tablet (500 mg total) by mouth 2 (two) times daily. One po bid x 7 days 02/10/15   Kathrynn Speed, PA-C  montelukast (SINGULAIR) 10 MG tablet Take 10 mg by mouth at bedtime. 12/02/13   Historical Provider, MD  moxifloxacin (AVELOX) 400 MG tablet Take 1 tablet (400 mg total) by mouth daily at 8 pm. 03/06/15   Gwyneth Sprout, MD  nystatin cream (MYCOSTATIN) Apply to affected area 2 times daily 03/17/14   Margarita Grizzle, MD  omeprazole (PRILOSEC) 20 MG capsule Take 20 mg by mouth daily.    Historical Provider, MD  ondansetron (ZOFRAN ODT) 8 MG disintegrating tablet 8mg  ODT q4 hours prn nausea 03/23/14   Wayland Salinas, MD  ondansetron (ZOFRAN) 4 MG tablet Take 4 mg by mouth every 8 (eight) hours as needed for nausea or vomiting.    Historical Provider, MD  oxycodone (OXY-IR) 5 MG capsule Take 5 mg by mouth every 4 (four) hours as needed.    Historical Provider, MD  oxyCODONE-acetaminophen (PERCOCET/ROXICET) 5-325 MG per tablet Take 1-2 tablets by mouth  every 6 (six) hours as needed for severe pain. 03/06/15   Gwyneth Sprout, MD  pantoprazole (PROTONIX) 40 MG tablet Take 40 mg by mouth daily.    Historical Provider, MD  polyethylene glycol (MIRALAX / GLYCOLAX) packet Take 17 g by mouth daily as needed for moderate constipation.    Historical Provider, MD  polyethylene glycol powder (MIRALAX) powder Take 17 g by mouth daily. 12/25/14   Tinia Oravec, MD  potassium chloride SA (K-DUR,KLOR-CON) 20 MEQ tablet Take 20 mEq by mouth daily. 01/17/14   Historical Provider, MD  Probiotic Product (PROBIOTIC DAILY PO) Take 1-2 tablets by mouth daily.    Historical Provider, MD  promethazine (PHENERGAN) 25 MG tablet Take 1 tablet (25 mg total) by mouth every 6 (six) hours as needed for nausea or vomiting. 03/06/15   Gwyneth Sprout, MD  senna-docusate (SENOKOT S) 8.6-50 MG per tablet Take 1 tablet by mouth  2 (two) times daily. 12/25/14   Yanira Tolsma, MD  sodium chloride (OCEAN) 0.65 % SOLN nasal spray Place 1 spray into both nostrils as needed for congestion. 11/07/14   Kathrynn Speed, PA-C  triamcinolone (NASACORT ALLERGY 24HR) 55 MCG/ACT AERO nasal inhaler Place 2 sprays into the nose daily as needed (for stuffy nose).     Historical Provider, MD  triamcinolone cream (KENALOG) 0.1 % Apply 1 application topically daily as needed (for dry skin).  08/17/13   Historical Provider, MD  Vitamin D, Ergocalciferol, (DRISDOL) 50000 UNITS CAPS capsule Take 50,000 Units by mouth every 7 (seven) days.    Historical Provider, MD   BP 103/74 mmHg  Pulse 98  Temp(Src) 98.2 F (36.8 C) (Oral)  Resp 18  Ht  (1.651 m)  Wt 172 lb 9 oz (78.274 kg)  BMI 28.72 kg/m2  SpO2 98% Physical Exam  Constitutional: She is oriented to person, place, and time. She appears well-developed and well-nourished. No distress.  HENT:  Head: Normocephalic and atraumatic.  Mouth/Throat: Oropharynx is clear and moist.  Eyes: EOM are normal. Pupils are equal, round, and reactive to light.   Neck: Normal range of motion. Neck supple.  Cardiovascular: Normal rate, regular rhythm and intact distal pulses.   Pulmonary/Chest: Effort normal and breath sounds normal. No respiratory distress. She has no wheezes. She has no rales.  Abdominal: Soft. Bowel sounds are normal. There is no tenderness. There is no rebound and no guarding.  Musculoskeletal: Normal range of motion.  Neurological: She is alert and oriented to person, place, and time.  Skin: Skin is warm and dry.    ED Course  Procedures (including critical care time) Labs Review Labs Reviewed  URINALYSIS, ROUTINE W REFLEX MICROSCOPIC (NOT AT [facility])    Imaging Review No results found.   EKG Interpretation None      MDM   Final diagnoses:  None  Per care everywhere [facility] attempted to contact patient for follow up and was unsuccessful.  Curiously patient presents this am (tuesday) having not followed up with her pMD or [facility] regarding not having her RX.    No stools in the ED, exam and vitals are benign and reassuring as is acute abdominal series.    Given normal labs in care everywhere and 2 recent CT scans will not repeat.  Will confirm with [facility] the need for PO vancomycin and prescribe 7 day course as had 3 days already.  Patient also requested an RX for fluconazole and we have prescribed that as well.  She will follow up with her GI MD regarding ongoing care.  She has follow up 03/15/15.  She has recently filled  30 percocet tablets and 60 OXy IR.  Follow up with your pain management specialist for ongoing pain management    Winston Misner, MD 03/13/15 0244  Lillis Nuttle, MD 03/13/15 [phone]

## 2015-03-13 NOTE — ED Notes (Signed)
Dc from hospital on Saturday  Was suppose be on vanc,  States was not given prescription and it wasn't called in

## 2015-03-13 NOTE — ED Notes (Signed)
Pt was dc from hospital on Saturday, per pt was suppose to be on vanc,  Pt states she was not given prescription nor was prescription called in  States she has 2 appointment w her md on June 30

## 2015-04-03 DIAGNOSIS — Z85828 Personal history of other malignant neoplasm of skin: Secondary | ICD-10-CM | POA: Insufficient documentation

## 2015-05-09 ENCOUNTER — Institutional Professional Consult (permissible substitution): Payer: Medicare Other | Admitting: Internal Medicine

## 2015-05-15 DIAGNOSIS — K589 Irritable bowel syndrome without diarrhea: Secondary | ICD-10-CM | POA: Insufficient documentation

## 2015-05-26 ENCOUNTER — Emergency Department (HOSPITAL_BASED_OUTPATIENT_CLINIC_OR_DEPARTMENT_OTHER)
Admission: EM | Admit: 2015-05-26 | Discharge: 2015-05-26 | Disposition: A | Discharge status: Home / Self Care | Payer: Medicare Other | Attending: Emergency Medicine | Admitting: Emergency Medicine

## 2015-05-26 ENCOUNTER — Encounter (HOSPITAL_BASED_OUTPATIENT_CLINIC_OR_DEPARTMENT_OTHER): Payer: Self-pay

## 2015-05-26 ENCOUNTER — Emergency Department (HOSPITAL_BASED_OUTPATIENT_CLINIC_OR_DEPARTMENT_OTHER): Payer: Medicare Other

## 2015-05-26 DIAGNOSIS — M797 Fibromyalgia: Secondary | ICD-10-CM | POA: Diagnosis not present

## 2015-05-26 DIAGNOSIS — J449 Chronic obstructive pulmonary disease, unspecified: Secondary | ICD-10-CM | POA: Diagnosis not present

## 2015-05-26 DIAGNOSIS — Z79899 Other long term (current) drug therapy: Secondary | ICD-10-CM | POA: Insufficient documentation

## 2015-05-26 DIAGNOSIS — E119 Type 2 diabetes mellitus without complications: Secondary | ICD-10-CM | POA: Insufficient documentation

## 2015-05-26 DIAGNOSIS — G43001 Migraine without aura, not intractable, with status migrainosus: Secondary | ICD-10-CM | POA: Diagnosis not present

## 2015-05-26 DIAGNOSIS — Z862 Personal history of diseases of the blood and blood-forming organs and certain disorders involving the immune mechanism: Secondary | ICD-10-CM | POA: Insufficient documentation

## 2015-05-26 DIAGNOSIS — K5900 Constipation, unspecified: Secondary | ICD-10-CM | POA: Diagnosis not present

## 2015-05-26 DIAGNOSIS — Z8619 Personal history of other infectious and parasitic diseases: Secondary | ICD-10-CM | POA: Diagnosis not present

## 2015-05-26 DIAGNOSIS — M1990 Unspecified osteoarthritis, unspecified site: Secondary | ICD-10-CM | POA: Insufficient documentation

## 2015-05-26 DIAGNOSIS — Z792 Long term (current) use of antibiotics: Secondary | ICD-10-CM | POA: Insufficient documentation

## 2015-05-26 DIAGNOSIS — K219 Gastro-esophageal reflux disease without esophagitis: Secondary | ICD-10-CM | POA: Diagnosis not present

## 2015-05-26 DIAGNOSIS — Z8679 Personal history of other diseases of the circulatory system: Secondary | ICD-10-CM | POA: Insufficient documentation

## 2015-05-26 DIAGNOSIS — H109 Unspecified conjunctivitis: Secondary | ICD-10-CM | POA: Diagnosis not present

## 2015-05-26 DIAGNOSIS — Z880 Allergy status to penicillin: Secondary | ICD-10-CM | POA: Diagnosis not present

## 2015-05-26 DIAGNOSIS — Z8701 Personal history of pneumonia (recurrent): Secondary | ICD-10-CM | POA: Insufficient documentation

## 2015-05-26 DIAGNOSIS — R51 Headache: Secondary | ICD-10-CM | POA: Diagnosis present

## 2015-05-26 HISTORY — DX: Fibromyalgia: M79.7

## 2015-05-26 LAB — URINALYSIS, ROUTINE W REFLEX MICROSCOPIC
Bilirubin Urine: NEGATIVE
GLUCOSE, UA: NEGATIVE mg/dL
Hgb urine dipstick: NEGATIVE
Ketones, ur: NEGATIVE mg/dL
LEUKOCYTES UA: NEGATIVE
Nitrite: NEGATIVE
PH: 7 (ref 5.0–8.0)
PROTEIN: NEGATIVE mg/dL
Specific Gravity, Urine: 1.014 (ref 1.005–1.030)
Urobilinogen, UA: 0.2 mg/dL (ref 0.0–1.0)

## 2015-05-26 LAB — CBG MONITORING, ED: Glucose-Capillary: 95 mg/dL (ref 65–99)

## 2015-05-26 MED ORDER — DICYCLOMINE HCL 10 MG/ML IM SOLN
20.0000 mg | Freq: Once | INTRAMUSCULAR | Status: AC
  Administered 2015-05-26: 20 mg via INTRAMUSCULAR
  Filled 2015-05-26: qty 2

## 2015-05-26 MED ORDER — DIPHENHYDRAMINE HCL 50 MG/ML IJ SOLN
12.5000 mg | Freq: Once | INTRAMUSCULAR | Status: AC
  Administered 2015-05-26: 12.5 mg via INTRAMUSCULAR
  Filled 2015-05-26: qty 1

## 2015-05-26 MED ORDER — METOCLOPRAMIDE HCL 5 MG/ML IJ SOLN
10.0000 mg | Freq: Once | INTRAMUSCULAR | Status: AC
  Administered 2015-05-26: 10 mg via INTRAMUSCULAR
  Filled 2015-05-26: qty 2

## 2015-05-26 MED ORDER — ERYTHROMYCIN 5 MG/GM OP OINT: TOPICAL_OINTMENT | OPHTHALMIC | Status: DC

## 2015-05-26 MED ORDER — PROMETHAZINE HCL 25 MG/ML IJ SOLN
12.5000 mg | Freq: Once | INTRAMUSCULAR | Status: AC
  Administered 2015-05-26: 12.5 mg via INTRAMUSCULAR
  Filled 2015-05-26: qty 1

## 2015-05-26 NOTE — ED Provider Notes (Addendum)
CSN: [id number]     Arrival date & time 05/26/15  0130 History   First MD Initiated Contact with Patient 05/26/15 [phone]     Chief Complaint  Patient presents with  . Headache     (Consider location/radiation/quality/duration/timing/severity/associated sxs/prior Treatment) Patient is a 54 y.o. female presenting with migraines. The history is provided by the patient.  Migraine This is a recurrent problem. The current episode started more than 2 days ago. The problem occurs constantly. The problem has not changed since onset.Pertinent negatives include no chest pain, no abdominal pain and no shortness of breath. Nothing aggravates the symptoms. Nothing relieves the symptoms. Treatments tried: narcotics. The treatment provided no relief.  No weakness nor numbness no trauma.  Had colonoscopy Monday and receive pain medication and sedation.  No f/c/r.  No travel.  States she has had diarrhea and bloating and was told her colonscopy and abdominal MRI were normal but she does not believe it  Past Medical History  Diagnosis Date  . Sarcoidosis   . COPD (chronic obstructive pulmonary disease)   . Arthritis   . Acid reflux   . Mitral valve prolapse   . Diverticulosis   . Pancreatitis 1997  . Sepsis   . Pneumonia   . Sepsis   . Diabetes mellitus without complication   . Fibromyalgia    Past Surgical History  Procedure Laterality Date  . Cholecystectomy    . Abdominal hysterectomy     History reviewed. No pertinent family history. Social History  Substance Use Topics  . Smoking status: Passive Smoke Exposure - Never Smoker  . Smokeless tobacco: Never Used  . Alcohol Use: No   OB History    No data available     Review of Systems  Respiratory: Negative for shortness of breath.   Cardiovascular: Negative for chest pain.  Gastrointestinal: Negative for abdominal pain.  All other systems reviewed and are negative.     Allergies  Amoxicillin; Avandia; Baclofen; Ceftin; Celebrex;  Ciprofloxacin; Clindamycin/lincomycin; Doxycycline; Flagyl; Levaquin; Macrobid; Neurontin; Other; Sulfa antibiotics; Symbicort; Ultram; Vancomycin; Vioxx; Voltaren; and Tape  Home Medications   Prior to Admission medications   Medication Sig Start Date End Date Taking? Authorizing Provider  albuterol (PROAIR HFA) 108 (90 BASE) MCG/ACT inhaler Inhale 2 puffs into the lungs every 6 (six) hours as needed for wheezing or shortness of breath.    Historical Provider, MD  alprazolam Prudy Feeler) 2 MG tablet Take 2 mg by mouth 2 (two) times daily as needed for sleep or anxiety.     Historical Provider, MD  amoxicillin-clavulanate (AUGMENTIN) 875-125 MG per tablet Take 1 tablet by mouth 2 (two) times daily. 06/11/14   Kaitlyn Szekalski, PA-C  azithromycin (ZITHROMAX) 250 MG tablet Take 1 tablet (250 mg total) by mouth daily. Take first 2 tablets together, then 1 every day until finished. 11/07/14   Kathrynn Speed, PA-C  cetirizine (ZYRTEC) 10 MG tablet Take 10 mg by mouth daily.    Historical Provider, MD  ciprofloxacin (CIPRO) 500 MG tablet Take 1 tablet (500 mg total) by mouth 2 (two) times daily. One po bid x 7 days 02/10/15   Kathrynn Speed, PA-C  cyclobenzaprine (FLEXERIL) 5 MG tablet Take 5 mg by mouth 3 (three) times daily as needed for muscle spasms.    Historical Provider, MD  diclofenac sodium (VOLTAREN) 1 % GEL Apply 1 application topically 3 (three) times daily as needed (for pain).  01/18/14   Historical Provider, MD  dicyclomine (BENTYL) 10 MG  capsule Take 10 mg by mouth 4 (four) times daily -  before meals and at bedtime.    Historical Provider, MD  fexofenadine (ALLEGRA) 180 MG tablet Take 180 mg by mouth daily.    Historical Provider, MD  fluconazole (DIFLUCAN) 150 MG tablet Take 1 tablet (150 mg total) by mouth once. 03/13/15   Kimberlea Schlag, MD  glycerin adult (GLYCERIN ADULT) 2 G SUPP Place 1 suppository rectally 2 (two) times daily as needed for moderate constipation. 12/25/14   Nicholaos Schippers, MD   HYDROcodone-acetaminophen (NORCO) 5-325 MG per tablet Take 2 tablets by mouth every 4 (four) hours as needed. 03/23/14   Wayland Salinas, MD  HYDROcodone-acetaminophen (NORCO/VICODIN) 5-325 MG per tablet Take 2 tablets by mouth every 6 (six) hours as needed for moderate pain.     Historical Provider, MD  hyoscyamine (ANASPAZ) 0.125 MG TBDP disintergrating tablet Place 0.125 mg under the tongue every 4 (four) hours as needed for bladder spasms.    Historical Provider, MD  levalbuterol [facility] HFA) 45 MCG/ACT inhaler Inhale 2 puffs into the lungs every 4 (four) hours as needed for wheezing.    Historical Provider, MD  levalbuterol Pauline Aus) 1.25 MG/3ML nebulizer solution Take 1.25 mg by nebulization every 4 (four) hours as needed for wheezing.    Historical Provider, MD  methylPREDNISolone (MEDROL DOSEPAK) 4 MG tablet Take tapering dose per package instructions. 11/29/14   John Molpus, MD  metroNIDAZOLE (FLAGYL) 500 MG tablet Take 1 tablet (500 mg total) by mouth 2 (two) times daily. One po bid x 7 days 02/10/15   Kathrynn Speed, PA-C  montelukast (SINGULAIR) 10 MG tablet Take 10 mg by mouth at bedtime. 12/02/13   Historical Provider, MD  moxifloxacin (AVELOX) 400 MG tablet Take 1 tablet (400 mg total) by mouth daily at 8 pm. 03/06/15   Gwyneth Sprout, MD  nystatin cream (MYCOSTATIN) Apply to affected area 2 times daily 03/17/14   Margarita Grizzle, MD  omeprazole (PRILOSEC) 20 MG capsule Take 20 mg by mouth daily.    Historical Provider, MD  ondansetron (ZOFRAN ODT) 8 MG disintegrating tablet 8mg  ODT q4 hours prn nausea 03/23/14   Wayland Salinas, MD  ondansetron (ZOFRAN) 4 MG tablet Take 4 mg by mouth every 8 (eight) hours as needed for nausea or vomiting.    Historical Provider, MD  oxycodone (OXY-IR) 5 MG capsule Take 5 mg by mouth every 4 (four) hours as needed.    Historical Provider, MD  oxyCODONE-acetaminophen (PERCOCET/ROXICET) 5-325 MG per tablet Take 1-2 tablets by mouth every 6 (six) hours as needed for severe  pain. 03/06/15   Gwyneth Sprout, MD  pantoprazole (PROTONIX) 40 MG tablet Take 40 mg by mouth daily.    Historical Provider, MD  polyethylene glycol (MIRALAX / GLYCOLAX) packet Take 17 g by mouth daily as needed for moderate constipation.    Historical Provider, MD  polyethylene glycol powder (MIRALAX) powder Take 17 g by mouth daily. 12/25/14   Orrie Schubert, MD  potassium chloride SA (K-DUR,KLOR-CON) 20 MEQ tablet Take 20 mEq by mouth daily. 01/17/14   Historical Provider, MD  Probiotic Product (PROBIOTIC DAILY PO) Take 1-2 tablets by mouth daily.    Historical Provider, MD  promethazine (PHENERGAN) 25 MG tablet Take 1 tablet (25 mg total) by mouth every 6 (six) hours as needed for nausea or vomiting. 03/06/15   Gwyneth Sprout, MD  senna-docusate (SENOKOT S) 8.6-50 MG per tablet Take 1 tablet by mouth 2 (two) times daily. 12/25/14   Tala Eber,  MD  sodium chloride (OCEAN) 0.65 % SOLN nasal spray Place 1 spray into both nostrils as needed for congestion. 11/07/14   Kathrynn Speed, PA-C  triamcinolone (NASACORT ALLERGY 24HR) 55 MCG/ACT AERO nasal inhaler Place 2 sprays into the nose daily as needed (for stuffy nose).     Historical Provider, MD  triamcinolone cream (KENALOG) 0.1 % Apply 1 application topically daily as needed (for dry skin).  08/17/13   Historical Provider, MD  vancomycin (VANCOCIN HCL) 125 MG capsule Take 1 capsule (125 mg total) by mouth 4 (four) times daily. 03/13/15   Dov Dill, MD  Vitamin D, Ergocalciferol, (DRISDOL) 50000 UNITS CAPS capsule Take 50,000 Units by mouth every 7 (seven) days.    Historical Provider, MD   BP 136/94 mmHg  Pulse 78  Temp(Src) 98 F (36.7 C) (Oral)  Resp 20  Ht  (1.651 m)  Wt 166 lb (75.297 kg)  BMI 27.62 kg/m2  SpO2 99% Physical Exam  Constitutional: She is oriented to person, place, and time. She appears well-developed and well-nourished. No distress.  Sleeping in room upon entrance  HENT:  Head: Normocephalic and atraumatic.   Mouth/Throat: Oropharynx is clear and moist.  Eyes: EOM are normal. Pupils are equal, round, and reactive to light. Right conjunctiva is injected.  Neck: Normal range of motion. Neck supple.  Cardiovascular: Normal rate, regular rhythm and intact distal pulses.   Pulmonary/Chest: Effort normal and breath sounds normal. No respiratory distress. She has no wheezes. She has no rales.  Abdominal: Soft. Bowel sounds are normal. She exhibits no distension and no mass. There is no tenderness. There is no rebound and no guarding.  Musculoskeletal: Normal range of motion. She exhibits no edema or tenderness.  Neurological: She is alert and oriented to person, place, and time. She has normal reflexes. No cranial nerve deficit. Coordination normal.  Skin: Skin is warm and dry.  Psychiatric: She has a normal mood and affect.    ED Course  Procedures (including critical care time) Labs Review Labs Reviewed  URINALYSIS, ROUTINE W REFLEX MICROSCOPIC (NOT AT [facility])  CBG MONITORING, ED    Imaging Review No results found. I have personally reviewed and evaluated these images and lab results as part of my medical decision-making.   EKG Interpretation None      MDM   Final diagnoses:  None   Medications  metoCLOPramide (REGLAN) injection 10 mg (not administered)  diphenhydrAMINE (BENADRYL) injection 12.5 mg (not administered)  promethazine (PHENERGAN) injection 12.5 mg (not administered)  dicyclomine (BENTYL) injection 20 mg (not administered)   No stool in the ED no signs of acute intracranial pathology on exam. Exam and vitals are benign ans reassuring no change in cognition or neuro deficits.  Suspect HA is a rebound from the narcotics and sedation from colonoscopy.    I have reviewed patient's labs and colonoscopy through care everywhere, normal by report.  I will treat the migraine with a migraine cocktail .  I will treat the pink eye with erythromycin ointment.  She is to follow up with  her PMD and specialists for all other ongoing issues    Haya Hemler, MD 05/26/15 0530  Danae Oland, MD 05/26/15 [phone]

## 2015-05-26 NOTE — Discharge Instructions (Signed)
Bacterial Conjunctivitis °Bacterial conjunctivitis (commonly called pink eye) is redness, soreness, or puffiness (inflammation) of the white part of your eye. It is caused by a germ called bacteria. These germs can easily spread from person to person (contagious). Your eye often will become red or pink. Your eye may also become irritated, watery, or have a thick discharge.  °HOME CARE  °· Apply a cool, clean washcloth over closed eyelids. Do this for 10-20 minutes, 3-4 times a day while you have pain. °· Gently wipe away any fluid coming from the eye with a warm, wet washcloth or cotton ball. °· Wash your hands often with soap and water. Use paper towels to dry your hands. °· Do not share towels or washcloths. °· Change or wash your pillowcase every day. °· Do not use eye makeup until the infection is gone. °· Do not use machines or drive if your vision is blurry. °· Stop using contact lenses. Do not use them again until your doctor says it is okay. °· Do not touch the tip of the eye drop bottle or medicine tube with your fingers when you put medicine on the eye. °GET HELP RIGHT AWAY IF:  °· Your eye is not better after 3 days of starting your medicine. °· You have a yellowish fluid coming out of the eye. °· You have more pain in the eye. °· Your eye redness is spreading. °· Your vision becomes blurry. °· You have a fever or lasting symptoms for more than 2-3 days. °· You have a fever and your symptoms suddenly get worse. °· You have pain in the face. °· Your face gets red or puffy (swollen). °MAKE SURE YOU:  °· Understand these instructions. °· Will watch this condition. °· Will get help right away if you are not doing well or get worse. °Document Released: 06/10/2008 Document Revised: 08/18/2012 Document Reviewed: 05/07/2012 °[employer]® Patient Information ©2015 [employer]. This information is not intended to replace advice given to you by your health care provider. Make sure you discuss any questions you have  with your health care provider. ° °

## 2015-05-26 NOTE — ED Notes (Signed)
Pt c/o headache for past few days, rectal bleeding, lost 10lbs in 3wks, c/o rt eye irritation, rash to rt leg; pt states had a MRI, endoscopy and a colonoscopy on monday

## 2015-05-26 NOTE — ED Notes (Signed)
I took CBG with result of 95 mg./dcltr.

## 2015-06-09 IMAGING — CT CT RENAL STONE PROTOCOL
2 of 4 series · 15 of 46 positions shown, 17 images · non-contrast
Comparison: CT of the abdomen and pelvis from 12/22/2014

CLINICAL DATA: Acute onset of dysuria and dark urine. Lower
generalized abdominal pain and lower back pain. Headache. Initial
encounter.

EXAM:
CT ABDOMEN AND PELVIS WITHOUT CONTRAST
TECHNIQUE: Multidetector CT imaging of the abdomen and pelvis was performed
following the standard protocol without IV contrast.

[Series 2: renal stone > 200 lbs 5.0 b31f · axial · 0.66mm/px · z∈[-511,-91]mm · 12 of 92 slices shown, 14 images]
[im 4/92  soft-tissue]
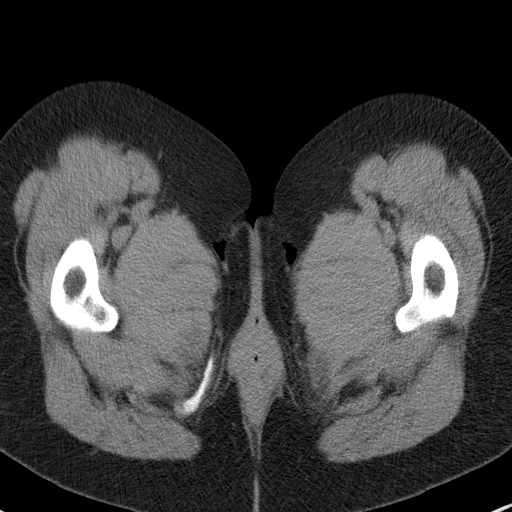
[im 4/92  bone]
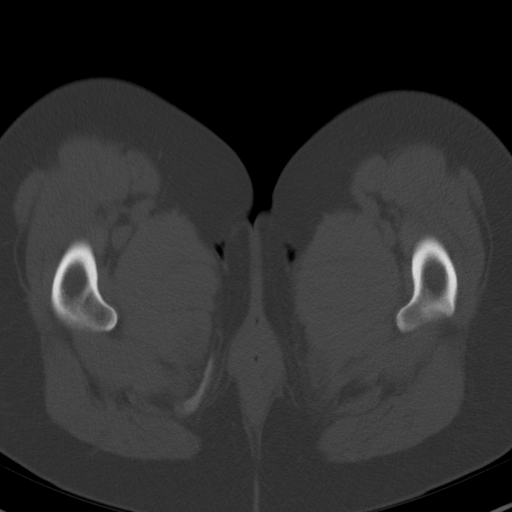
[im 12/92  soft-tissue]
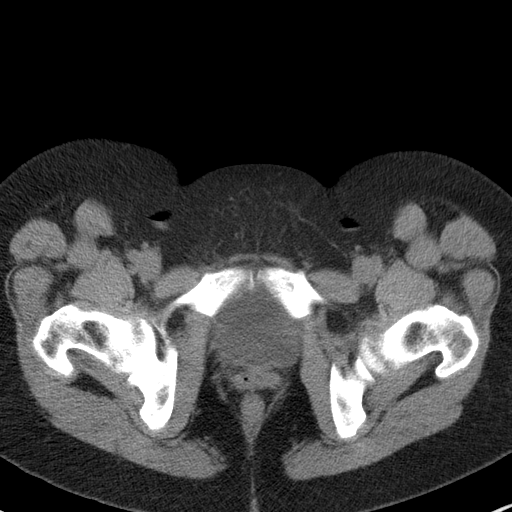
[im 19/92  soft-tissue]
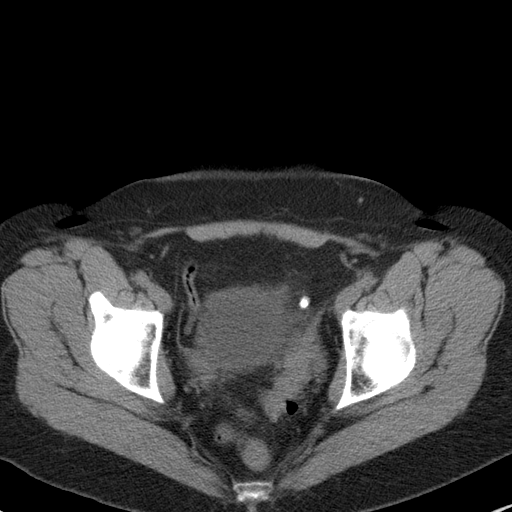
[im 27/92  soft-tissue]
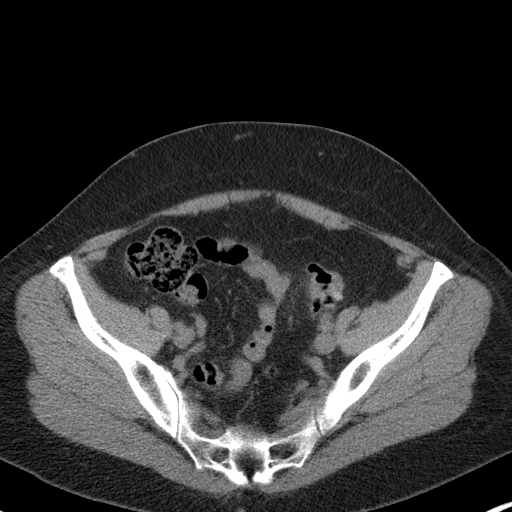
[im 35/92  soft-tissue]
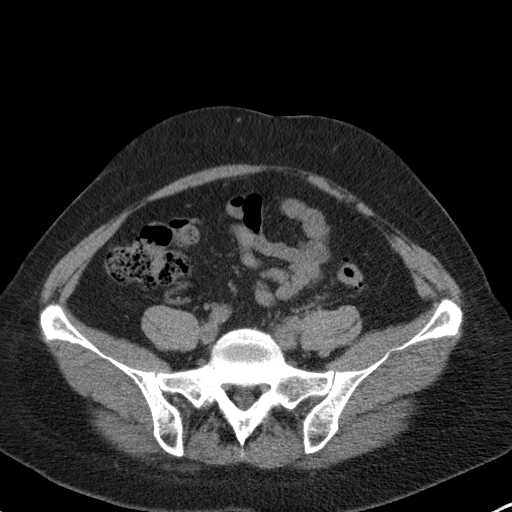
[im 42/92  soft-tissue]
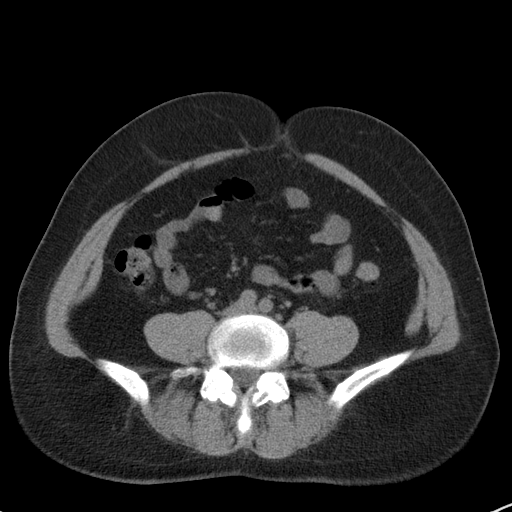
[im 50/92  soft-tissue]
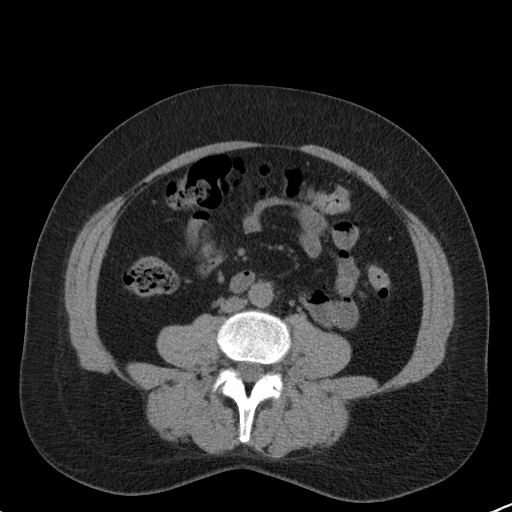
[im 57/92  soft-tissue]
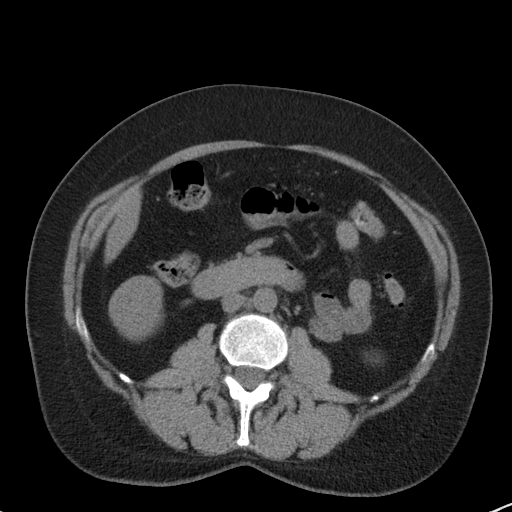
[im 65/92  soft-tissue]
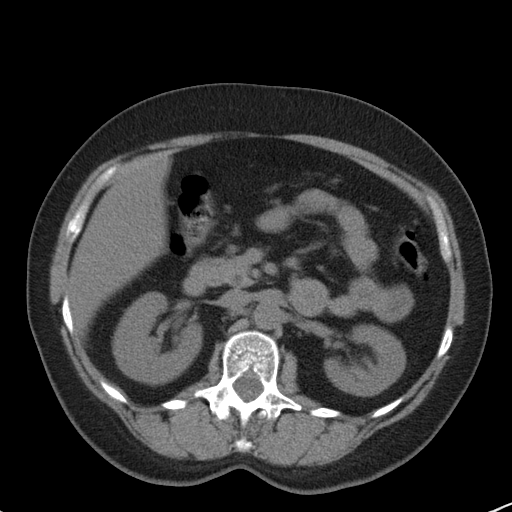
[im 65/92  bone]
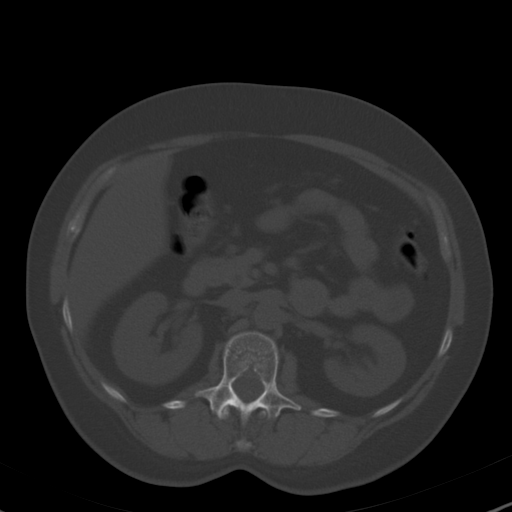
[im 73/92  soft-tissue]
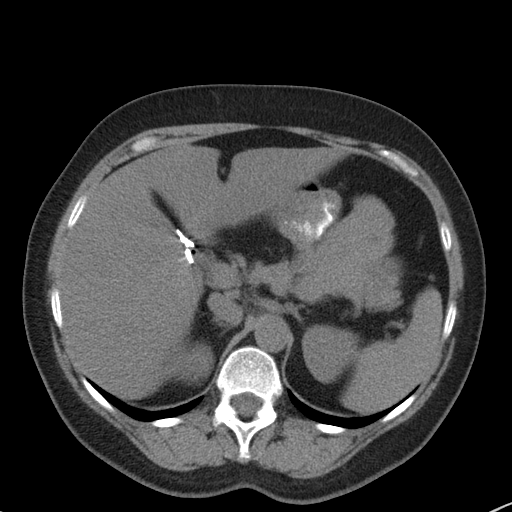
[im 80/92  soft-tissue]
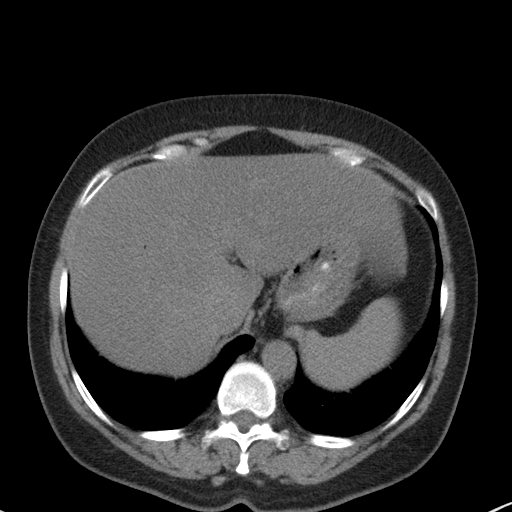
[im 88/92  soft-tissue]
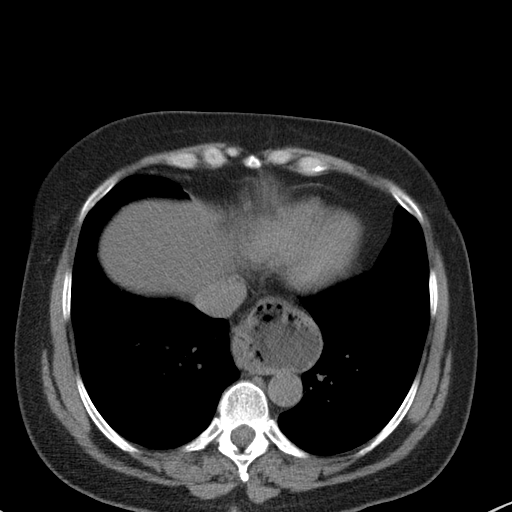

[Series 5: renal stone 3.0 coronal · coronal · 0.66mm/px · 3 of 91 slices shown]
[im 31/91  soft-tissue]
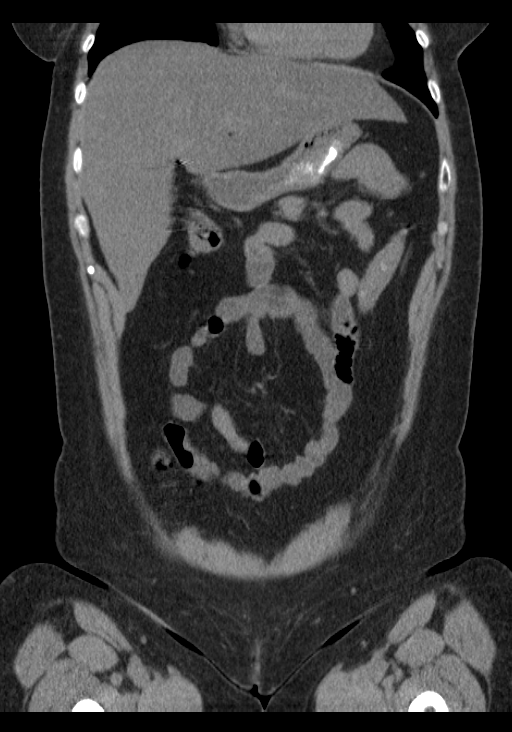
[im 41/91  soft-tissue]
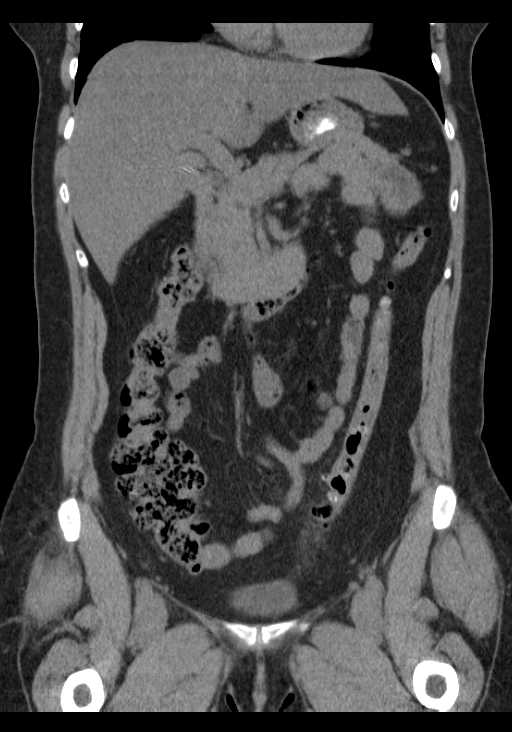
[im 51/91  soft-tissue]
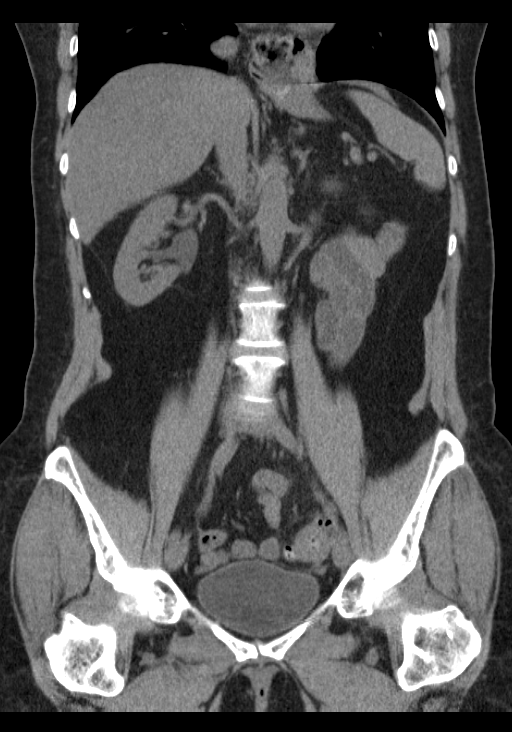

[15 of 46 positions shown; findings below may reference images not displayed]

FINDINGS: The visualized lung bases are clear. A moderate hiatal hernia is
seen, containing fluid and trace contrast.

The liver and spleen are unremarkable in appearance. Trace
pneumobilia is thought to reflect prior instrumentation at the
duodenal ampulla. The patient is status post cholecystectomy, with
clips noted at the gallbladder fossa. The pancreas and adrenal
glands are unremarkable.

The kidneys are unremarkable in appearance. There is no evidence of
hydronephrosis. No renal or ureteral stones are seen. No perinephric
stranding is appreciated.

No free fluid is identified. The small bowel is unremarkable in
appearance. The stomach is within normal limits. No acute vascular
abnormalities are seen.

The appendix is normal in caliber, without evidence of appendicitis.
Mild diverticulosis is noted along the descending and sigmoid colon.

Mild acute diverticulitis is noted at the proximal sigmoid colon,
with mild focal soft tissue inflammation. There is no evidence of
perforation or abscess formation at this time.

The bladder is mildly distended and grossly unremarkable. The
patient is status post hysterectomy. No suspicious adnexal masses
are seen. No inguinal lymphadenopathy is seen.

No acute osseous abnormalities are identified.
IMPRESSION: 1. Mild acute diverticulitis at the proximal sigmoid colon, with
mild focal soft tissue inflammation. No evidence of perforation or
abscess formation at this time.
2. Mild diverticulosis along the descending and sigmoid colon.
3. Moderate hiatal hernia, containing fluid and trace contrast.
4. Trace pneumobilia is thought to reflect prior instrumentation at
the duodenal ampulla.

## 2015-06-12 ENCOUNTER — Telehealth: Payer: Self-pay | Admitting: Pulmonary Disease

## 2015-06-13 NOTE — Telephone Encounter (Signed)
Will set patient up on Dr. Reginia Naas schedule in [place] once November schedule has been opened Called to advise patient, phone number listed was to Dr. Henriette Combs office, mobile phone does not work.  Did not leave message on work phone.  Wcb Set reminder to schedule appointment for [place] in November

## 2015-06-15 ENCOUNTER — Other Ambulatory Visit: Payer: Self-pay

## 2015-06-15 ENCOUNTER — Ambulatory Visit (INDEPENDENT_AMBULATORY_CARE_PROVIDER_SITE_OTHER): Payer: Medicare Other | Admitting: Internal Medicine

## 2015-06-15 ENCOUNTER — Encounter: Payer: Self-pay | Admitting: Internal Medicine

## 2015-06-15 VITALS — BP 116/90 | HR 80 | Temp 98.1°F | Resp 16 | Ht 63.0 in | Wt 166.0 lb

## 2015-06-15 DIAGNOSIS — J4531 Mild persistent asthma with (acute) exacerbation: Secondary | ICD-10-CM

## 2015-06-15 DIAGNOSIS — J3089 Other allergic rhinitis: Secondary | ICD-10-CM | POA: Diagnosis not present

## 2015-06-15 DIAGNOSIS — J4530 Mild persistent asthma, uncomplicated: Secondary | ICD-10-CM | POA: Insufficient documentation

## 2015-06-15 MED ORDER — BENZONATATE 100 MG PO CAPS: 100.0000 mg | ORAL_CAPSULE | Freq: Three times a day (TID) | ORAL | Status: DC

## 2015-06-15 MED ORDER — METHYLPREDNISOLONE ACETATE 40 MG/ML IJ SUSP
40.0000 mg | Freq: Once | INTRAMUSCULAR | Status: AC
  Administered 2015-06-15: 40 mg via INTRAMUSCULAR

## 2015-06-15 NOTE — Assessment & Plan Note (Addendum)
   Continue cetirizine alternating with fexofenadine  Continue nasacort nasal spray  Start lubricating eye drops

## 2015-06-15 NOTE — Assessment & Plan Note (Addendum)
   Persistent, currently not well controlled due to post infectious inflammation  Depo-medrol 40 mg injection given today   Start Tessalon Perles 100 mg three times a day as needed  Continue Singulair along with as needed albuterol  She will schedule followup with her Pulmonologist in [place] while she is waiting for an appt with Dr. Vassie Loll [facility])

## 2015-06-15 NOTE — Patient Instructions (Addendum)
Mild persistent asthma  Persistent, currently not well controlled due to post infectious inflammation  Depo-medrol 40 mg injection given today   Start Tessalon Perles 100 mg three times a day as needed  Continue Singulair along with as needed albuterol  She will schedule followup with her Pulmonologist in [place] while she is waiting for an appt with Dr. Vassie Loll [facility])  Other allergic rhinitis  Continue cetirizine alternating with fexofenadine  Continue nasacort nasal spray  Start lubricating eye drops

## 2015-06-15 NOTE — Progress Notes (Signed)
06/15/2015  Holly Rogers 07/24/1961 [id number]  Referring provider: Langley Gauss, MD [address] [place], [place] [address]  Chief Complaint: Follow-up and URI   Holly Rogers is a 54 y.o. female who is being seen today for followup   HPI Comments: Asthma/Sarcoidosis: Pt was doing well until recently when she developed an upper respiratory infection.  She was treated with a prednisone burst and a zpak but is continuing to have wheezing, dry cough.  She was following a pulmonologist in [place] but wishes to transfer her care to a closer physician.  She has a request pending for Dr. Vassie Loll.  Allergic rhinitis: She was doing well until recently as above.    ROS: Per HPI unless specifically indicated below ROS   Drug Allergies:  Allergies  Allergen Reactions  . Amoxicillin Nausea Only  . Avandia [Rosiglitazone]     Chest pain  . Baclofen Nausea Only    Unknown, patient knows she couldn't take it  . Ceftin [Cefuroxime Axetil]     unknown  . Celebrex [Celecoxib]     Chest pain  . Ciprofloxacin Nausea And Vomiting  . Clindamycin/Lincomycin     unknown  . Doxycycline     unknown  . Flagyl [Metronidazole] Nausea And Vomiting  . Levaquin [Levofloxacin In D5w]     unknown  . Macrobid [employer] Macro]     unknown  . Neurontin [Gabapentin]     Gave patient problems  . Other Nausea And Vomiting    Darvocet  . Sulfa Antibiotics Nausea And Vomiting  . Symbicort [Budesonide-Formoterol Fumarate]     Shakes  . Ultram [Tramadol]     unknown  . Vancomycin Nausea And Vomiting  . Vioxx [Rofecoxib]     unknown  . Voltaren [Diclofenac Sodium]     Unknown. But she can use the gel  . Tape Rash    Can use paper tape     Physical Exam: BP 116/90 mmHg  Pulse 80  Temp(Src) 98.1 F (36.7 C) (Oral)  Resp 16  Ht  (1.6 m)  Wt 166 lb (75.297 kg)  BMI 29.41 kg/m2  Physical Exam  Constitutional: She appears well-developed and  well-nourished. No distress.  HENT:  Right Ear: External ear normal.  Left Ear: External ear normal.  Nose: Nose normal.  Mouth/Throat: Oropharynx is clear and moist.  Injected conjunctiva   Cardiovascular: Normal rate, regular rhythm and normal heart sounds.   No murmur heard. Pulmonary/Chest: Effort normal and breath sounds normal. No respiratory distress. She has no wheezes. She has no rales.  Abdominal: Soft. Bowel sounds are normal.  Lymphadenopathy:    She has no cervical adenopathy.  Skin: No rash noted.  Vitals reviewed.     Diagnostics:   Spirometry: FEV1 107 %, FEV1/FVC  82%   Spirometry is in the normal range.   Assessment and Plan:  Mild persistent asthma  Persistent, currently not well controlled due to post infectious inflammation  Depo-medrol 40 mg injection given today   Start Tessalon Perles 100 mg three times a day as needed  Continue Singulair along with as needed albuterol  She will schedule followup with her Pulmonologist in [place] while she is waiting for an appt with Dr. Vassie Loll [facility])  Other allergic rhinitis  Continue cetirizine alternating with fexofenadine  Continue nasacort nasal spray  Start lubricating eye drops     Return in about 4 weeks (around 07/13/2015).  Thank you for the opportunity to care for this patient.  Please do not hesitate to contact me with questions.  [facility] [address] [place], [place] [address] [phone]

## 2015-06-18 NOTE — Telephone Encounter (Signed)
Patient notified. Patient says that we can give any information to her Daughter, Scientist, physiological for Nov appt. Nothing further needed.

## 2015-06-21 ENCOUNTER — Institutional Professional Consult (permissible substitution): Payer: Medicare Other | Admitting: Internal Medicine

## 2015-07-09 ENCOUNTER — Telehealth: Payer: Self-pay | Admitting: Pulmonary Disease

## 2015-07-09 NOTE — Telephone Encounter (Signed)
Attempted to contact patient to schedule consult appt with RA on 11[facility]3 [facility].   lmtcb.

## 2015-07-10 ENCOUNTER — Telehealth: Payer: Self-pay | Admitting: *Deleted

## 2015-07-10 NOTE — Telephone Encounter (Signed)
Attempted to contact patient to [facility]. Spoke with daughter, Danielle Dessaris, she will have mom call to schedule appt. Awaiting call back to schedule appt.

## 2015-07-11 ENCOUNTER — Encounter (HOSPITAL_BASED_OUTPATIENT_CLINIC_OR_DEPARTMENT_OTHER): Payer: Self-pay | Admitting: Emergency Medicine

## 2015-07-11 ENCOUNTER — Emergency Department (HOSPITAL_BASED_OUTPATIENT_CLINIC_OR_DEPARTMENT_OTHER)
Admission: EM | Admit: 2015-07-11 | Discharge: 2015-07-11 | Disposition: A | Discharge status: Home / Self Care | Payer: Medicare Other | Attending: Emergency Medicine | Admitting: Emergency Medicine

## 2015-07-11 DIAGNOSIS — M797 Fibromyalgia: Secondary | ICD-10-CM | POA: Diagnosis not present

## 2015-07-11 DIAGNOSIS — Z862 Personal history of diseases of the blood and blood-forming organs and certain disorders involving the immune mechanism: Secondary | ICD-10-CM | POA: Diagnosis not present

## 2015-07-11 DIAGNOSIS — G43909 Migraine, unspecified, not intractable, without status migrainosus: Secondary | ICD-10-CM | POA: Insufficient documentation

## 2015-07-11 DIAGNOSIS — Z880 Allergy status to penicillin: Secondary | ICD-10-CM | POA: Diagnosis not present

## 2015-07-11 DIAGNOSIS — J449 Chronic obstructive pulmonary disease, unspecified: Secondary | ICD-10-CM | POA: Insufficient documentation

## 2015-07-11 DIAGNOSIS — R110 Nausea: Secondary | ICD-10-CM

## 2015-07-11 DIAGNOSIS — K219 Gastro-esophageal reflux disease without esophagitis: Secondary | ICD-10-CM | POA: Insufficient documentation

## 2015-07-11 DIAGNOSIS — E119 Type 2 diabetes mellitus without complications: Secondary | ICD-10-CM | POA: Diagnosis not present

## 2015-07-11 DIAGNOSIS — Z8679 Personal history of other diseases of the circulatory system: Secondary | ICD-10-CM | POA: Insufficient documentation

## 2015-07-11 DIAGNOSIS — Z8701 Personal history of pneumonia (recurrent): Secondary | ICD-10-CM | POA: Insufficient documentation

## 2015-07-11 DIAGNOSIS — Z8619 Personal history of other infectious and parasitic diseases: Secondary | ICD-10-CM | POA: Insufficient documentation

## 2015-07-11 DIAGNOSIS — L739 Follicular disorder, unspecified: Secondary | ICD-10-CM | POA: Insufficient documentation

## 2015-07-11 DIAGNOSIS — M1990 Unspecified osteoarthritis, unspecified site: Secondary | ICD-10-CM | POA: Diagnosis not present

## 2015-07-11 DIAGNOSIS — G43809 Other migraine, not intractable, without status migrainosus: Secondary | ICD-10-CM

## 2015-07-11 DIAGNOSIS — R112 Nausea with vomiting, unspecified: Secondary | ICD-10-CM | POA: Diagnosis present

## 2015-07-11 DIAGNOSIS — Z79899 Other long term (current) drug therapy: Secondary | ICD-10-CM | POA: Insufficient documentation

## 2015-07-11 LAB — URINALYSIS, ROUTINE W REFLEX MICROSCOPIC
BILIRUBIN URINE: NEGATIVE
Glucose, UA: NEGATIVE mg/dL
Hgb urine dipstick: NEGATIVE
KETONES UR: NEGATIVE mg/dL
Leukocytes, UA: NEGATIVE
NITRITE: NEGATIVE
PH: 6.5 (ref 5.0–8.0)
Protein, ur: NEGATIVE mg/dL
Specific Gravity, Urine: 1.012 (ref 1.005–1.030)
Urobilinogen, UA: 0.2 mg/dL (ref 0.0–1.0)

## 2015-07-11 MED ORDER — ONDANSETRON 8 MG PO TBDP
8.0000 mg | ORAL_TABLET | Freq: Once | ORAL | Status: AC
  Administered 2015-07-11: 8 mg via ORAL
  Filled 2015-07-11: qty 1

## 2015-07-11 MED ORDER — METOCLOPRAMIDE HCL 5 MG/ML IJ SOLN
10.0000 mg | Freq: Once | INTRAMUSCULAR | Status: AC
  Administered 2015-07-11: 10 mg via INTRAMUSCULAR
  Filled 2015-07-11: qty 2

## 2015-07-11 MED ORDER — ONDANSETRON 8 MG PO TBDP
8.0000 mg | ORAL_TABLET | Freq: Three times a day (TID) | ORAL | Status: DC | PRN
Start: 2015-07-11 — End: 2015-08-22

## 2015-07-11 NOTE — ED Notes (Signed)
Patient stable and ambulatory.  Patient verbalizes understanding of discharge instructions and medications. 

## 2015-07-11 NOTE — Telephone Encounter (Signed)
Called [place] back to see if we can schedule pt for [facility]. Awaiting call back from [place] to confirm.
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
## 2015-07-11 NOTE — ED Notes (Signed)
Nausea has improved, per patient.

## 2015-07-11 NOTE — Telephone Encounter (Signed)
Called and left message for Holly Rogers to call back to schedule [facility].   Awaiting call back.  If no call back by 5pm, will use 3:15 slot for another patient, will have to contact patient when we get another space open

## 2015-07-11 NOTE — ED Notes (Signed)
MD at bedside. 

## 2015-07-11 NOTE — Telephone Encounter (Signed)
Marcelino DusterMichelle, RA does not have anything available until January. Is pt being worked in? thanks

## 2015-07-11 NOTE — ED Provider Notes (Signed)
CSN: [id number]     Arrival date & time 07/11/15  0115 History   First MD Initiated Contact with Patient 07/11/15 0117     Chief Complaint  Patient presents with  . Nausea  . Diarrhea  . Headache  . Insect Bite      HPI Patient presents complaining of nausea and headache.  She states her headache is been present over the past several days but seems to be worsening.  She has a history of migraine headaches but denies photophobia at this time.  She reports nausea without vomiting.  She denies current active diarrhea.  She did have diarrhea several days ago that has since resolved.  She denies abdominal pain.  No chest pain or shortness of breath.  No fevers or chills.  No neck pain or stiffness.  No new rash.  She does report a small possible spider bite to her left posterior distal forearm.  She denies spreading redness.   Past Medical History  Diagnosis Date  . Sarcoidosis (HCC)   . COPD (chronic obstructive pulmonary disease) (HCC)   . Arthritis   . Acid reflux   . Mitral valve prolapse   . Diverticulosis   . Pancreatitis 1997  . Sepsis (HCC)   . Pneumonia   . Sepsis (HCC)   . Diabetes mellitus without complication (HCC)   . Fibromyalgia   . Asthma   . COPD (chronic obstructive pulmonary disease) [facility])    Past Surgical History  Procedure Laterality Date  . Cholecystectomy    . Abdominal hysterectomy     Family History  Problem Relation Age of Onset  . Asthma Mother    Social History  Substance Use Topics  . Smoking status: Passive Smoke Exposure - Never Smoker  . Smokeless tobacco: Never Used  . Alcohol Use: No   OB History    No data available     Review of Systems  All other systems reviewed and are negative.     Allergies  Amoxicillin; Avandia; Baclofen; Ceftin; Celebrex; Ciprofloxacin; Clindamycin/lincomycin; Doxycycline; Flagyl; Levaquin; Macrobid; Neurontin; Other; Sulfa antibiotics; Symbicort; Ultram; Vancomycin; Vioxx; Voltaren; and Tape  Home  Medications   Prior to Admission medications   Medication Sig Start Date End Date Taking? Authorizing Provider  albuterol (PROAIR HFA) 108 (90 BASE) MCG/ACT inhaler Inhale 2 puffs into the lungs every 6 (six) hours as needed for wheezing or shortness of breath.   Yes Historical Provider, MD  alprazolam Prudy Feeler) 2 MG tablet Take 2 mg by mouth 2 (two) times daily as needed for sleep or anxiety.    Yes Historical Provider, MD  benzonatate (TESSALON) 100 MG capsule Take 1 capsule (100 mg total) by mouth 3 (three) times daily. 06/15/15  Yes Mikki Santee, MD  cetirizine (ZYRTEC) 10 MG tablet Take 10 mg by mouth daily.   Yes Historical Provider, MD  cyclobenzaprine (FLEXERIL) 5 MG tablet Take 5 mg by mouth 3 (three) times daily as needed for muscle spasms.   Yes Historical Provider, MD  diclofenac sodium (VOLTAREN) 1 % GEL Apply 1 application topically 3 (three) times daily as needed (for pain).  01/18/14  Yes Historical Provider, MD  dicyclomine (BENTYL) 10 MG capsule Take 10 mg by mouth 4 (four) times daily -  before meals and at bedtime.   Yes Historical Provider, MD  fexofenadine (ALLEGRA) 180 MG tablet Take 180 mg by mouth daily.   Yes Historical Provider, MD  glycerin adult (GLYCERIN ADULT) 2 G SUPP Place 1 suppository rectally 2 (  two) times daily as needed for moderate constipation. 12/25/14  Yes April Palumbo, MD  HYDROcodone-acetaminophen [facility]) 5-325 MG per tablet Take 2 tablets by mouth every 4 (four) hours as needed. 03/23/14  Yes Wayland Salinas, MD  HYDROcodone-acetaminophen (NORCO/VICODIN) 5-325 MG per tablet Take 2 tablets by mouth every 6 (six) hours as needed for moderate pain.    Yes Historical Provider, MD  levalbuterol [facility] HFA) 45 MCG/ACT inhaler Inhale 2 puffs into the lungs every 4 (four) hours as needed for wheezing.   Yes Historical Provider, MD  levalbuterol (XOPENEX) 1.25 MG/3ML nebulizer solution Take 1.25 mg by nebulization every 4 (four) hours as needed for wheezing.   Yes  Historical Provider, MD  montelukast (SINGULAIR) 10 MG tablet Take 10 mg by mouth at bedtime. 12/02/13  Yes Historical Provider, MD  omeprazole (PRILOSEC) 20 MG capsule Take 20 mg by mouth daily.   Yes Historical Provider, MD  oxyCODONE-acetaminophen (PERCOCET/ROXICET) 5-325 MG per tablet Take 1-2 tablets by mouth every 6 (six) hours as needed for severe pain. 03/06/15  Yes Gwyneth Sprout, MD  tizanidine (ZANAFLEX) 2 MG capsule Take 2 mg by mouth 3 (three) times daily as needed for muscle spasms.   Yes Historical Provider, MD  amoxicillin-clavulanate (AUGMENTIN) 875-125 MG per tablet Take 1 tablet by mouth 2 (two) times daily. Patient not taking: Reported on 06/15/2015 06/11/14   Emilia Beck, PA-C  azithromycin (ZITHROMAX) 250 MG tablet Take 1 tablet (250 mg total) by mouth daily. Take first 2 tablets together, then 1 every day until finished. Patient not taking: Reported on 06/15/2015 11/07/14   Kathrynn Speed, PA-C  ciprofloxacin (CIPRO) 500 MG tablet Take 1 tablet (500 mg total) by mouth 2 (two) times daily. One po bid x 7 days Patient not taking: Reported on 06/15/2015 02/10/15   Kathrynn Speed, PA-C  erythromycin ophthalmic ointment Place a 1/2 inch ribbon of ointment into the lower eyelid bid. Patient not taking: Reported on 06/15/2015 05/26/15   April Palumbo, MD  fluconazole (DIFLUCAN) 150 MG tablet Take 1 tablet (150 mg total) by mouth once. Patient not taking: Reported on 06/15/2015 03/13/15   April Palumbo, MD  hyoscyamine (ANASPAZ) 0.125 MG TBDP disintergrating tablet Place 0.125 mg under the tongue every 4 (four) hours as needed for bladder spasms.    Historical Provider, MD  methylPREDNISolone (MEDROL DOSEPAK) 4 MG tablet Take tapering dose per package instructions. Patient not taking: Reported on 06/15/2015 11/29/14   Paula Libra, MD  metroNIDAZOLE (FLAGYL) 500 MG tablet Take 1 tablet (500 mg total) by mouth 2 (two) times daily. One po bid x 7 days Patient not taking: Reported on 06/15/2015  02/10/15   Kathrynn Speed, PA-C  moxifloxacin (AVELOX) 400 MG tablet Take 1 tablet (400 mg total) by mouth daily at 8 pm. Patient not taking: Reported on 06/15/2015 03/06/15   Gwyneth Sprout, MD  nystatin cream (MYCOSTATIN) Apply to affected area 2 times daily Patient not taking: Reported on 06/15/2015 03/17/14   Margarita Grizzle, MD  ondansetron (ZOFRAN ODT) 8 MG disintegrating tablet  ODT q4 hours prn nausea Patient not taking: Reported on 06/15/2015 03/23/14   Wayland Salinas, MD  ondansetron (ZOFRAN) 4 MG tablet Take 4 mg by mouth every 8 (eight) hours as needed for nausea or vomiting.    Historical Provider, MD  oxycodone (OXY-IR) 5 MG capsule Take 5 mg by mouth every 4 (four) hours as needed.    Historical Provider, MD  pantoprazole (PROTONIX) 40 MG tablet Take 40 mg by  mouth daily.    Historical Provider, MD  polyethylene glycol (MIRALAX / GLYCOLAX) packet Take 17 g by mouth daily as needed for moderate constipation.    Historical Provider, MD  polyethylene glycol powder (MIRALAX) powder Take 17 g by mouth daily. 12/25/14   April Palumbo, MD  potassium chloride SA (K-DUR,KLOR-CON) 20 MEQ tablet Take 20 mEq by mouth daily. 01/17/14   Historical Provider, MD  Probiotic Product (PROBIOTIC DAILY PO) Take 1-2 tablets by mouth daily.    Historical Provider, MD  promethazine (PHENERGAN) 25 MG tablet Take 1 tablet (25 mg total) by mouth every 6 (six) hours as needed for nausea or vomiting. 03/06/15   Gwyneth Sprout, MD  senna (SENOKOT) 8.6 MG tablet Take 1 tablet by mouth 2 (two) times daily.    Historical Provider, MD  senna-docusate (SENOKOT S) 8.6-50 MG per tablet Take 1 tablet by mouth 2 (two) times daily. Patient not taking: Reported on 06/15/2015 12/25/14   April Palumbo, MD  sodium chloride (OCEAN) 0.65 % SOLN nasal spray Place 1 spray into both nostrils as needed for congestion. Patient not taking: Reported on 06/15/2015 11/07/14   Kathrynn Speed, PA-C  triamcinolone (NASACORT ALLERGY 24HR) 55 MCG/ACT AERO  nasal inhaler Place 2 sprays into the nose daily as needed (for stuffy nose).     Historical Provider, MD  triamcinolone cream (KENALOG) 0.1 % Apply 1 application topically daily as needed (for dry skin).  08/17/13   Historical Provider, MD  vancomycin (VANCOCIN HCL) 125 MG capsule Take 1 capsule (125 mg total) by mouth 4 (four) times daily. Patient not taking: Reported on 06/15/2015 03/13/15   April Palumbo, MD  Vitamin D, Ergocalciferol, (DRISDOL) 50000 UNITS CAPS capsule Take 50,000 Units by mouth every 7 (seven) days.    Historical Provider, MD   BP 151/80 mmHg  Pulse 74  Temp(Src) 98.3 F (36.8 C) (Oral)  Resp 18  Ht  (1.626 m)  Wt 164 lb (74.39 kg)  BMI 28.14 kg/m2  SpO2 97% Physical Exam  Constitutional: She is oriented to person, place, and time. She appears well-developed and well-nourished. No distress.  HENT:  Head: Normocephalic and atraumatic.  Eyes: EOM are normal. Pupils are equal, round, and reactive to light.  Neck: Normal range of motion.  Cardiovascular: Normal rate, regular rhythm and normal heart sounds.   Pulmonary/Chest: Effort normal and breath sounds normal.  Abdominal: Soft. She exhibits no distension. There is no tenderness.  Musculoskeletal: Normal range of motion.  Simple folliculitis of the left forearm.  No surrounding erythema or spreading erythema.  No fluctuance  Neurological: She is alert and oriented to person, place, and time.  5/5 strength in major muscle groups of  bilateral upper and lower extremities. Speech normal. No facial asymetry.   Skin: Skin is warm and dry.  Psychiatric: She has a normal mood and affect. Judgment normal.  Nursing note and vitals reviewed.   ED Course  Procedures (including critical care time) Labs Review Labs Reviewed  URINALYSIS, ROUTINE W REFLEX MICROSCOPIC (NOT AT [facility])    Imaging Review No results found. I have personally reviewed and evaluated these images and lab results as part of my medical  decision-making.   EKG Interpretation None      MDM   Final diagnoses:  None    Small folliculitis left forearm.  No indication for antibiotics.  Headache improved with Reglan.  Likely migraine variant.  Normal neurologic exam.  Discharge home in good condition.    Caryn Bee  Patria Maneampos, MD 07/11/15 [phone]

## 2015-07-11 NOTE — ED Notes (Signed)
Patient states she took a xanax and percocet tonight before arriving at ED.

## 2015-07-11 NOTE — Discharge Instructions (Signed)

## 2015-07-13 ENCOUNTER — Ambulatory Visit: Payer: Medicare Other | Admitting: Internal Medicine

## 2015-07-13 NOTE — Telephone Encounter (Signed)
lmtcb for [place]

## 2015-07-16 NOTE — Telephone Encounter (Signed)
Called spoke w/ [place]. She reports pt BF was calling to schedule appt. Advised no appt scheduled. She will call them now for them to call us.

## 2015-08-15 ENCOUNTER — Ambulatory Visit: Payer: Medicare Other | Admitting: Internal Medicine

## 2015-08-17 ENCOUNTER — Ambulatory Visit: Payer: Medicare Other | Admitting: Internal Medicine

## 2015-08-22 ENCOUNTER — Ambulatory Visit (INDEPENDENT_AMBULATORY_CARE_PROVIDER_SITE_OTHER): Payer: Medicare Other | Admitting: Internal Medicine

## 2015-08-22 ENCOUNTER — Encounter: Payer: Self-pay | Admitting: Internal Medicine

## 2015-08-22 VITALS — BP 112/72 | HR 89 | Ht 65.0 in | Wt 170.6 lb

## 2015-08-22 DIAGNOSIS — R0789 Other chest pain: Secondary | ICD-10-CM | POA: Diagnosis not present

## 2015-08-22 DIAGNOSIS — J4530 Mild persistent asthma, uncomplicated: Secondary | ICD-10-CM | POA: Diagnosis not present

## 2015-08-22 DIAGNOSIS — D869 Sarcoidosis, unspecified: Secondary | ICD-10-CM | POA: Diagnosis not present

## 2015-08-22 DIAGNOSIS — G8929 Other chronic pain: Secondary | ICD-10-CM

## 2015-08-22 NOTE — Patient Instructions (Signed)
Zostrix cream four times daily is the best way to treat neuralgia pains  - place just a small amount on the place the hurts the most and build up from there.  There is no evidence of active sarcoid here - pulmonary follow up is as needed

## 2015-08-22 NOTE — Progress Notes (Addendum)
   Subjective:    Patient ID: Holly Rogers, female    DOB: 1960/12/04,     MRN: [id number]  HPI           Review of Systems  Constitutional: Negative for fever, chills and unexpected weight change.  HENT: Positive for congestion, dental problem, trouble swallowing and voice change. Negative for ear pain, nosebleeds, postnasal drip, rhinorrhea, sinus pressure, sneezing and sore throat.   Eyes: Negative for visual disturbance.  Respiratory: Positive for cough and shortness of breath. Negative for choking.   Cardiovascular: Negative for chest pain and leg swelling.  Gastrointestinal: Negative for vomiting, abdominal pain and diarrhea.  Genitourinary: Negative for difficulty urinating.  Musculoskeletal: Positive for arthralgias.  Skin: Negative for rash.  Neurological: Positive for headaches. Negative for tremors and syncope.  Hematological: Does not bruise/bleed easily.       Objective:   Physical Exam      .      Assessment & Plan:

## 2015-08-23 ENCOUNTER — Encounter: Payer: Self-pay | Admitting: Internal Medicine

## 2015-08-23 NOTE — Assessment & Plan Note (Signed)
F/u by Dr Beaulah DinningBardelas, seems well controlled to me though I didn't specifically address this issue.  Parenthetically her hx is much more suggestive of  Classic Upper airway cough syndrome, so named because it's frequently impossible to sort out how much is  CR/sinusitis with freq throat clearing (which can be related to primary GERD)   vs  causing  secondary (" extra esophageal")  GERD from wide swings in gastric pressure that occur with throat clearing, often  promoting self use of mint and menthol lozenges that reduce the lower esophageal sphincter tone and exacerbate the problem further in a cyclical fashion.   These are the same pts (now being labeled as having "irritable larynx syndrome" by some cough centers) who not infrequently have a history of having failed to tolerate ace inhibitors,  dry powder inhalers or biphosphonates or report having atypical reflux symptoms that don't respond to standard doses of PPI , and are easily confused as having aecopd or asthma flares by even experienced allergists/ pulmonologists.

## 2015-08-23 NOTE — Progress Notes (Signed)
Subjective:    Patient ID: Holly Rogers, female    DOB: Feb 05, 1961,     MRN: [id number]  HPI  [id number] yowf never smoker with h/o allergies / asthma eval / f/u [facility] then back pain p moving Armoire > xrays > [facility] of sarcoid [facility] 2007 > no treatment for sarcoid then dry cough 2011 > Referred Dr Eulah PontMurphy at [facility] > no recs >  developed positional L CP mid 2015 assoc with doe>  referred by Toborg to pulmonary clinic 08/22/2015     08/22/2015 1st [facility] office visit/ Holly Rogers   Chief Complaint  Patient presents with  . Pulmonary Consult    Referred by Dr. Jessee Aversobert Toborg. Pt states she was dxed with Sarcoid by Bronchoscopy in 2007.  She c/o "lung pain"- left side x 18 months. She states that it's painful to lay on her left side.   L back / chest pain x 1.5 y gradual onset and progression  is present 24/7 and has moved from paraspinal on L gradually around L flank  to around to breast around T4/5 - character is  Tingly/ positional / worse with L side down, better if lie on R side  Not worse with cough, worse with talking as is the cough and sob talking and walking but not just walking   No obvious other patterns in day to day or daytime variabilty or assoc chronic cough or  chest tightness, subjective wheeze overt sinus or hb symptoms. No unusual exp hx or h/o childhood pna/ asthma or knowledge of premature birth.  Sleeping poorly chronically though without nocturnal  or early am exacerbation  of respiratory  c/o's or need for noct saba. Also denies any obvious fluctuation of symptoms with weather or environmental changes or other aggravating or alleviating factors except as outlined above   Current Medications, Allergies, Complete Past Medical History, Past Surgical History, Family History, and Social History were reviewed in [facility] electronic medical record.             Review of Systems  Constitutional: Negative for fever, chills and unexpected weight change.  HENT:  Positive for congestion, dental problem, trouble swallowing and voice change. Negative for ear pain, nosebleeds, postnasal drip, rhinorrhea, sinus pressure, sneezing and sore throat.   Eyes: Negative for visual disturbance.  Respiratory: Positive for cough and shortness of breath. Negative for choking.   Cardiovascular: Negative for chest pain and leg swelling.  Gastrointestinal: Negative for vomiting, abdominal pain and diarrhea.  Genitourinary: Negative for difficulty urinating.  Musculoskeletal: Positive for arthralgias.  Skin: Negative for rash.  Neurological: Positive for headaches. Negative for tremors and syncope.  Hematological: Does not bruise/bleed easily.       Objective:   Physical Exam   amb wf very unusaul affect and  failed to answer a single question asked in a straightforward manner, tending to go off on tangents or answer questions with ambiguous medical terms or diagnoses    Wt Readings from Last 3 Encounters:  08/22/15 170 lb 9.6 oz (77.384 kg)  07/11/15 164 lb (74.39 kg)  06/15/15 166 lb (75.297 kg)    Vital signs reviewed   HEENT: nl dentition, turbinates, and oropharynx. Nl external ear canals without cough reflex   NECK :  without JVD/Nodes/TM/ nl carotid upstrokes bilaterally   LUNGS: no acc muscle use,  Nl contour chest which is clear to A and P bilaterally without cough on insp or exp maneuvers   CV:  RRR  no s3 or murmur or increase in P2, no edema   ABD:  soft and nontender with nl inspiratory excursion in the supine position. No bruits or organomegaly, bowel sounds nl  MS:  Nl gait/ ext warm without deformities, calf tenderness, cyanosis or clubbing No obvious joint restrictions   SKIN: warm and dry without lesions    NEURO:  alert, approp, nl sensorium with  no motor deficits      I personally reviewed images and agree with radiology impression as follows:  CXR:  PA  05/26/15 Normal heart size and pulmonary vascularity. No focal  airspace disease or consolidation in the lungs. No blunting of costophrenic angles. No pneumothorax. Mediastinal contours appear intact.

## 2015-08-23 NOTE — Assessment & Plan Note (Signed)
As I explained to this patient in detail, the rule with sarcoid is that in 95% of pts with active dz there is an obvious abn on cxr, which is not the case here, and even when there is, it would not explain any of her symptoms.  No further w/u is indicated from a pulmonary perspective

## 2015-08-23 NOTE — Assessment & Plan Note (Signed)
This is a classic neuralgia pattern in a pt whose initial story begins with a back injury moving an armoire and morphed to sarcoid which was likely asymptomatic then as never treated with prednisone  Best option for treatment in the short run = zostrix, consider pain clinic referral for injection/ and f/u with her back speciliast as planned  It has no features at all to suggest a pulmonary or pleural source and is certainly not suggestive of active sarcoid, pulmonary or extrapulmonary  Total time devoted to counseling  = 35/635m review case with pt/ discussion of options/alternatives/ giving and going over instructions (see avs)

## 2015-09-08 ENCOUNTER — Encounter (HOSPITAL_BASED_OUTPATIENT_CLINIC_OR_DEPARTMENT_OTHER): Payer: Self-pay | Admitting: Emergency Medicine

## 2015-09-08 ENCOUNTER — Emergency Department (HOSPITAL_BASED_OUTPATIENT_CLINIC_OR_DEPARTMENT_OTHER)
Admission: EM | Admit: 2015-09-08 | Discharge: 2015-09-08 | Disposition: A | Discharge status: Home / Self Care | Payer: Medicare Other | Attending: Emergency Medicine | Admitting: Emergency Medicine

## 2015-09-08 DIAGNOSIS — Z880 Allergy status to penicillin: Secondary | ICD-10-CM | POA: Insufficient documentation

## 2015-09-08 DIAGNOSIS — G8918 Other acute postprocedural pain: Secondary | ICD-10-CM | POA: Diagnosis not present

## 2015-09-08 DIAGNOSIS — Z8679 Personal history of other diseases of the circulatory system: Secondary | ICD-10-CM | POA: Diagnosis not present

## 2015-09-08 DIAGNOSIS — Z7951 Long term (current) use of inhaled steroids: Secondary | ICD-10-CM | POA: Insufficient documentation

## 2015-09-08 DIAGNOSIS — Z8619 Personal history of other infectious and parasitic diseases: Secondary | ICD-10-CM | POA: Insufficient documentation

## 2015-09-08 DIAGNOSIS — J449 Chronic obstructive pulmonary disease, unspecified: Secondary | ICD-10-CM | POA: Diagnosis not present

## 2015-09-08 DIAGNOSIS — E119 Type 2 diabetes mellitus without complications: Secondary | ICD-10-CM | POA: Diagnosis not present

## 2015-09-08 DIAGNOSIS — Z8719 Personal history of other diseases of the digestive system: Secondary | ICD-10-CM | POA: Insufficient documentation

## 2015-09-08 DIAGNOSIS — Z862 Personal history of diseases of the blood and blood-forming organs and certain disorders involving the immune mechanism: Secondary | ICD-10-CM | POA: Diagnosis not present

## 2015-09-08 DIAGNOSIS — Z791 Long term (current) use of non-steroidal anti-inflammatories (NSAID): Secondary | ICD-10-CM | POA: Insufficient documentation

## 2015-09-08 DIAGNOSIS — Z8739 Personal history of other diseases of the musculoskeletal system and connective tissue: Secondary | ICD-10-CM | POA: Insufficient documentation

## 2015-09-08 DIAGNOSIS — K0889 Other specified disorders of teeth and supporting structures: Secondary | ICD-10-CM | POA: Diagnosis present

## 2015-09-08 DIAGNOSIS — K08409 Partial loss of teeth, unspecified cause, unspecified class: Secondary | ICD-10-CM | POA: Diagnosis not present

## 2015-09-08 DIAGNOSIS — Z8701 Personal history of pneumonia (recurrent): Secondary | ICD-10-CM | POA: Insufficient documentation

## 2015-09-08 MED ORDER — ONDANSETRON HCL 4 MG PO TABS: 4.0000 mg | ORAL_TABLET | Freq: Four times a day (QID) | ORAL | Status: DC

## 2015-09-08 MED ORDER — AMOXICILLIN 500 MG PO CAPS: 500.0000 mg | ORAL_CAPSULE | Freq: Two times a day (BID) | ORAL | Status: DC

## 2015-09-08 NOTE — Discharge Instructions (Signed)
Ms. Patience MuscaRobin Schiro,  Nice meeting you! Please follow-up with your dentist. Return to the emergency department if you develop fevers, facial swelling, increased pain, yellow/green drainage from your tooth. Feel better soon!  S. Lane HackerNicole Madix Blowe, PA-C

## 2015-09-08 NOTE — ED Notes (Signed)
Pt had L lower tooth extracted 4 days ago, states she noticed site is swollen and red. Also states having diarrhea and generalized viral sx since extraction and thinks she is having a systemic reaction.

## 2015-09-08 NOTE — ED Provider Notes (Signed)
CSN: [id number]     Arrival date & time 09/08/15  1845 History   First MD Initiated Contact with Patient 09/08/15 2249     Chief Complaint  Patient presents with  . Dental Pain   HPI   Holly Rogers is a 54 y.o. F PMH significant for DM, fibromyalgia, sarcoidosis, MVP, COPD presenting with a 4 day history of dental pain. She had a left lower tooth extracted 4 days ago, and she endorses swelling and redness around the procedure site. She mentions she has diarrhea, nausea, and nasal congestion, which she attributes to her recent tooth extraction that caused a systemic infection. No fevers, chills, vomiting, drainage from her gums.  Past Medical History  Diagnosis Date  . Sarcoidosis (HCC)   . COPD (chronic obstructive pulmonary disease) (HCC)   . Arthritis   . Acid reflux   . Mitral valve prolapse   . Diverticulosis   . Pancreatitis 1997  . Sepsis (HCC)   . Pneumonia   . Sepsis (HCC)   . Diabetes mellitus without complication (HCC)   . Fibromyalgia   . Asthma   . COPD (chronic obstructive pulmonary disease) (HCC)   . DDD (degenerative disc disease), lumbar    Past Surgical History  Procedure Laterality Date  . Cholecystectomy    . Abdominal hysterectomy     Family History  Problem Relation Age of Onset  . Asthma Mother   . COPD Mother     smoked  . Rheum arthritis Mother   . Skin cancer Mother    Social History  Substance Use Topics  . Smoking status: Never Smoker   . Smokeless tobacco: Never Used  . Alcohol Use: No   OB History    No data available     Review of Systems  Ten systems are reviewed and are negative for acute change except as noted in the HPI   Allergies  Amoxicillin; Avandia; Baclofen; Ceftin; Celebrex; Ciprofloxacin; Clindamycin/lincomycin; Doxycycline; Flagyl; Levaquin; Macrobid; Neurontin; Other; Sulfa antibiotics; Symbicort; Ultram; Vancomycin; Vioxx; Voltaren; and Tape  Home Medications   Prior to Admission medications   Medication Sig  Start Date End Date Taking? Authorizing Provider  albuterol (PROAIR HFA) 108 (90 BASE) MCG/ACT inhaler Inhale 2 puffs into the lungs every 6 (six) hours as needed for wheezing or shortness of breath.    Historical Provider, MD  alprazolam Prudy Feeler) 2 MG tablet Take 2 mg by mouth 2 (two) times daily as needed for sleep or anxiety.     Historical Provider, MD  cetirizine (ZYRTEC) 10 MG tablet Take 10 mg by mouth daily.    Historical Provider, MD  Cyanocobalamin (B-12 PO) Take 1 tablet by mouth daily.    Historical Provider, MD  diclofenac sodium (VOLTAREN) 1 % GEL Apply 1 application topically 3 (three) times daily as needed (for pain).  01/18/14   Historical Provider, MD  dicyclomine (BENTYL) 10 MG capsule Take 10 mg by mouth 4 (four) times daily -  before meals and at bedtime.    Historical Provider, MD  fexofenadine (ALLEGRA) 180 MG tablet Take 180 mg by mouth daily.    Historical Provider, MD  fluticasone (FLONASE) 50 MCG/ACT nasal spray Place 2 sprays into both nostrils daily.    Historical Provider, MD  hydrocortisone 2.5 % cream Apply 1 application topically 2 (two) times daily.    Historical Provider, MD  ibuprofen (ADVIL,MOTRIN) 200 MG tablet Take 200 mg by mouth every 6 (six) hours as needed.    Historical Provider, MD  montelukast (SINGULAIR) 10 MG tablet Take 10 mg by mouth at bedtime. 12/02/13   Historical Provider, MD  ondansetron (ZOFRAN) 4 MG tablet Take 4 mg by mouth every 8 (eight) hours as needed for nausea or vomiting.    Historical Provider, MD  oxyCODONE-acetaminophen (PERCOCET/ROXICET) 5-325 MG per tablet Take 1-2 tablets by mouth every 6 (six) hours as needed for severe pain. 03/06/15   Gwyneth SproutWhitney Plunkett, MD  polyethylene glycol powder (MIRALAX) powder Take 17 g by mouth daily. 12/25/14   April Palumbo, MD  potassium chloride SA (K-DUR,KLOR-CON) 20 MEQ tablet Take 20 mEq by mouth daily. 01/17/14   Historical Provider, MD  promethazine (PHENERGAN) 25 MG tablet Take 1 tablet (25 mg total)  by mouth every 6 (six) hours as needed for nausea or vomiting. 03/06/15   Gwyneth SproutWhitney Plunkett, MD  senna-docusate (SENOKOT S) 8.6-50 MG per tablet Take 1 tablet by mouth 2 (two) times daily. 12/25/14   April Palumbo, MD  tizanidine (ZANAFLEX) 2 MG capsule Take 2 mg by mouth 3 (three) times daily as needed for muscle spasms.    Historical Provider, MD  triamcinolone (NASACORT ALLERGY 24HR) 55 MCG/ACT AERO nasal inhaler Place 2 sprays into the nose daily as needed (for stuffy nose).     Historical Provider, MD  triamcinolone cream (KENALOG) 0.1 % Apply 1 application topically daily as needed (for dry skin).  08/17/13   Historical Provider, MD   BP 113/76 mmHg  Pulse 72  Temp(Src) 97.9 F (36.6 C) (Oral)  Resp 18  Ht 5\' 5"  (1.651 m)  Wt 76.658 kg  BMI 28.12 kg/m2  SpO2 100% Physical Exam  Constitutional: She appears well-developed and well-nourished. No distress.  HENT:  Head: Normocephalic and atraumatic.  Right Ear: External ear normal.  Left Ear: External ear normal.  Nose: Nose normal.  Mouth/Throat: Oropharynx is clear and moist. No oropharyngeal exudate.  Missing molar left mandible. No erythema, drainage, edema.  Eyes: Conjunctivae are normal. Pupils are equal, round, and reactive to light. Right eye exhibits no discharge. Left eye exhibits no discharge. No scleral icterus.  Neck: No tracheal deviation present.  Cardiovascular: Normal rate, regular rhythm, normal heart sounds and intact distal pulses.  Exam reveals no gallop and no friction rub.   No murmur heard. Pulmonary/Chest: Effort normal and breath sounds normal. No respiratory distress. She has no wheezes. She has no rales. She exhibits no tenderness.  Abdominal: Soft. Bowel sounds are normal. She exhibits no distension and no mass. There is no tenderness. There is no rebound and no guarding.  Musculoskeletal: She exhibits no edema.  Lymphadenopathy:    She has no cervical adenopathy.  Neurological: She is alert. Coordination  normal.  Skin: Skin is warm and dry. No rash noted. She is not diaphoretic. No erythema.  Psychiatric: She has a normal mood and affect. Her behavior is normal.  Nursing note and vitals reviewed.   ED Course  Procedures   MDM   Final diagnoses:  Pain, dental   Patient non-toxic appearing and VSS. No signs of infection on exam. Reassured patient that this is most likely post-op pain but she is requesting antibiotics because she has a mitral valve prolapse. Explained the most recent guidelines don't require prophylactic treatment anymore, but she is still requesting antibiotics. Dr. Madilyn Hookees will see the patient as well.  Patient may be safely discharged home with amoxicillin and zofran. Discussed reasons for return. Patient to follow-up with dentist. Patient in understanding and agreement with the plan.  Melton Krebs, PA-C 09/20/15 2344  Tilden Fossa, MD 09/21/15 [phone]

## 2015-09-22 IMAGING — DX DG ABDOMEN ACUTE W/ 1V CHEST
4 series · 4 of 4 positions shown · non-contrast
Comparison: CT abdomen and pelvis 03/29/2015. MRI abdomen
05/18/2015

CLINICAL DATA: Headaches for the past few days. Rectal bleeding.
Weight loss. Patient had an MRI, endoscopy, and colonoscopy on
[REDACTED]. Dr. to older she had an infection but did not state what.
Has had cancer taken out of the chest.

EXAM:
DG ABDOMEN ACUTE W/ 1V CHEST

[chest pa]
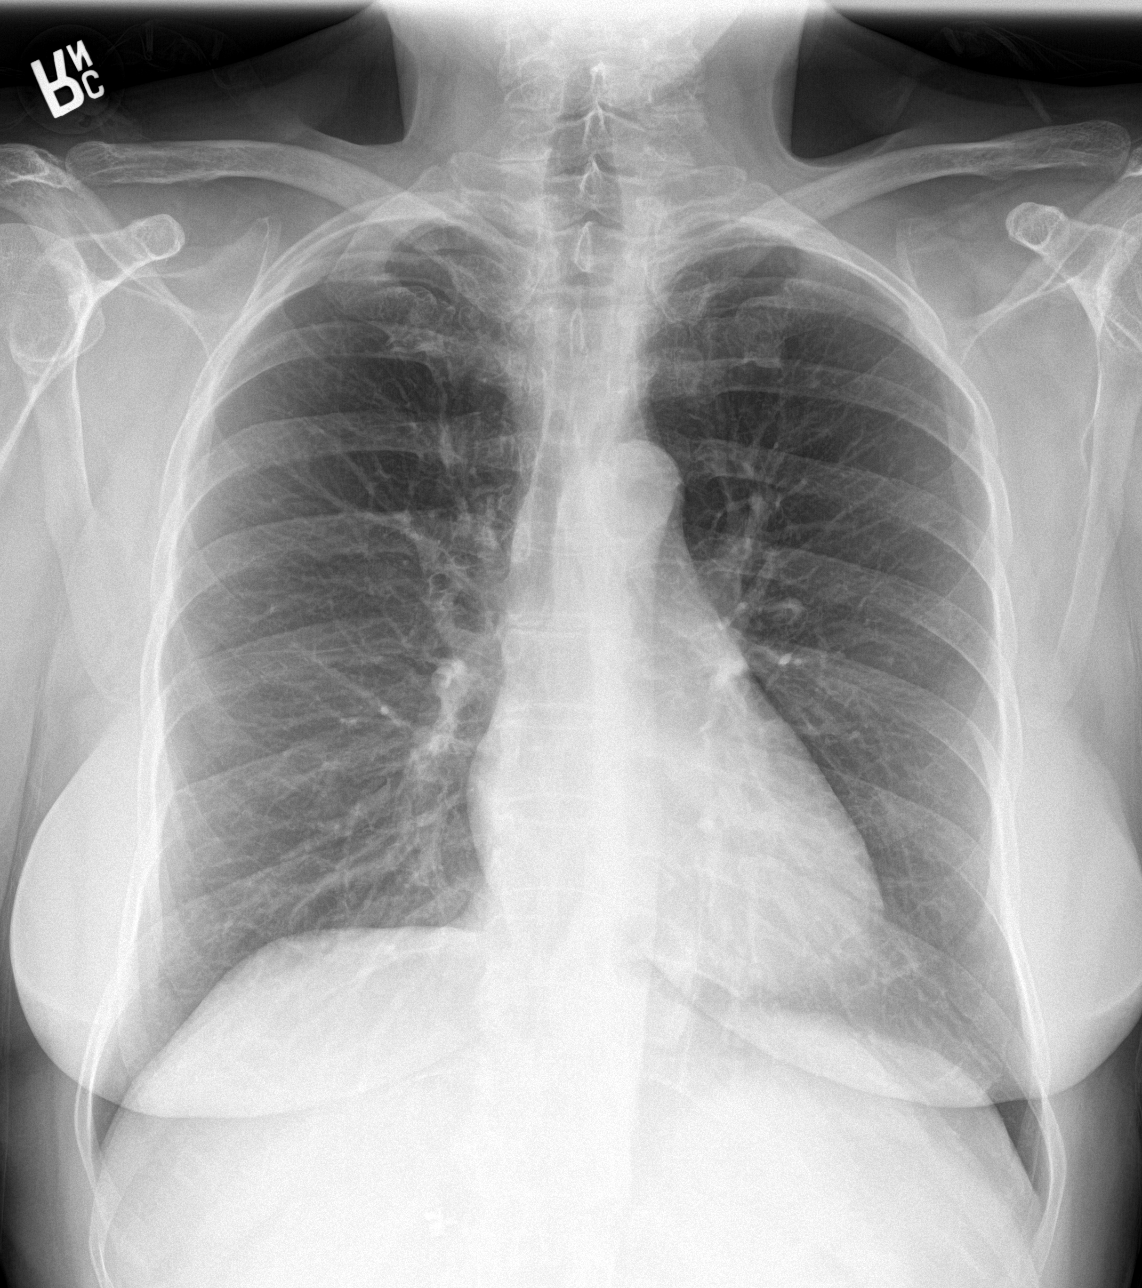

[abdomen erect]
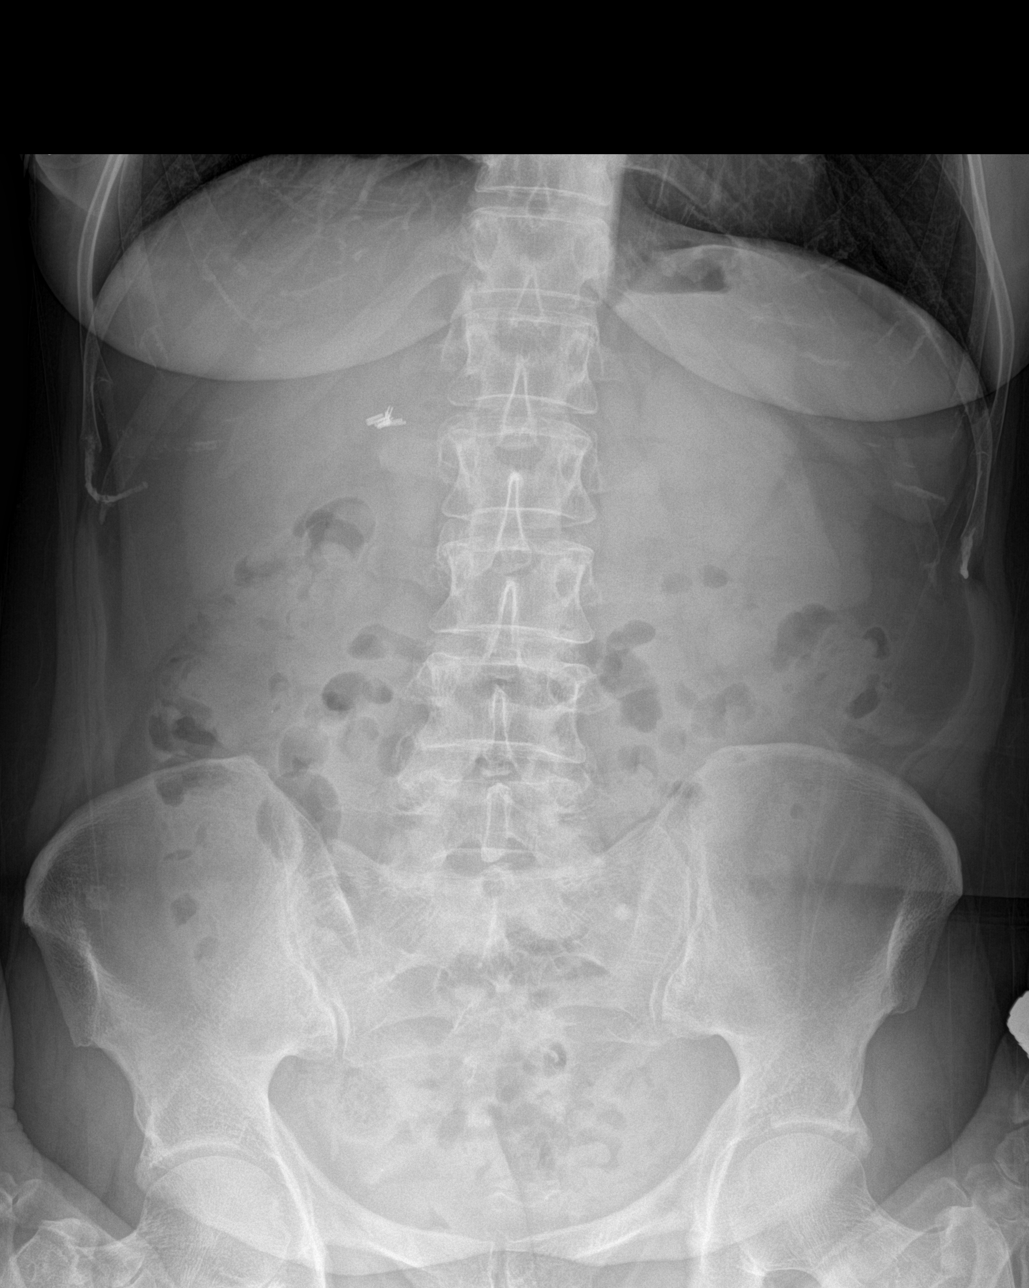

[abdomen supine (1 of 2)]
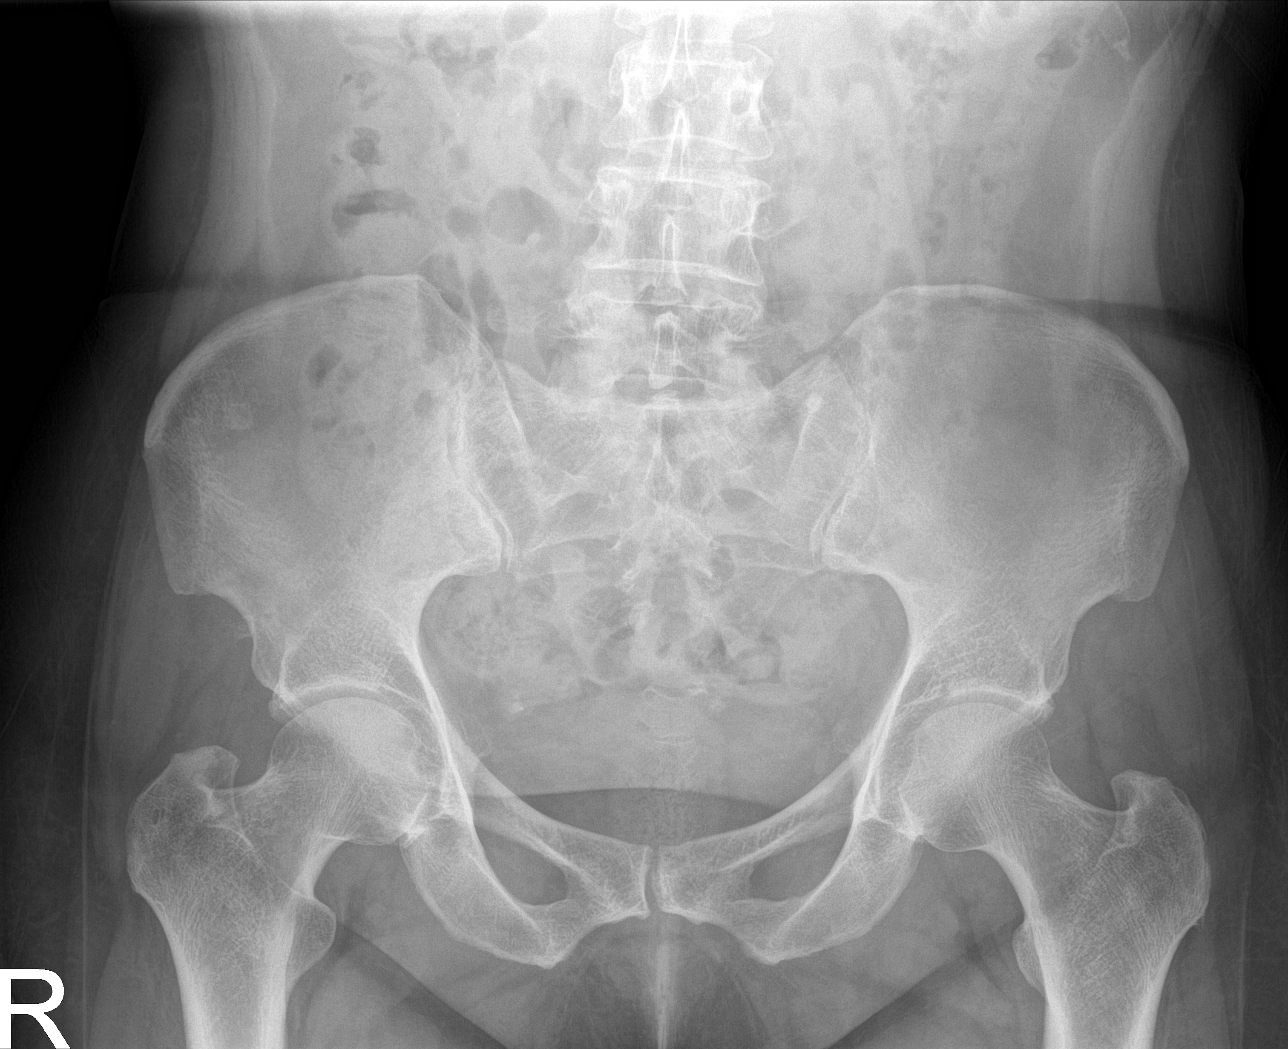

[abdomen supine (2 of 2)]
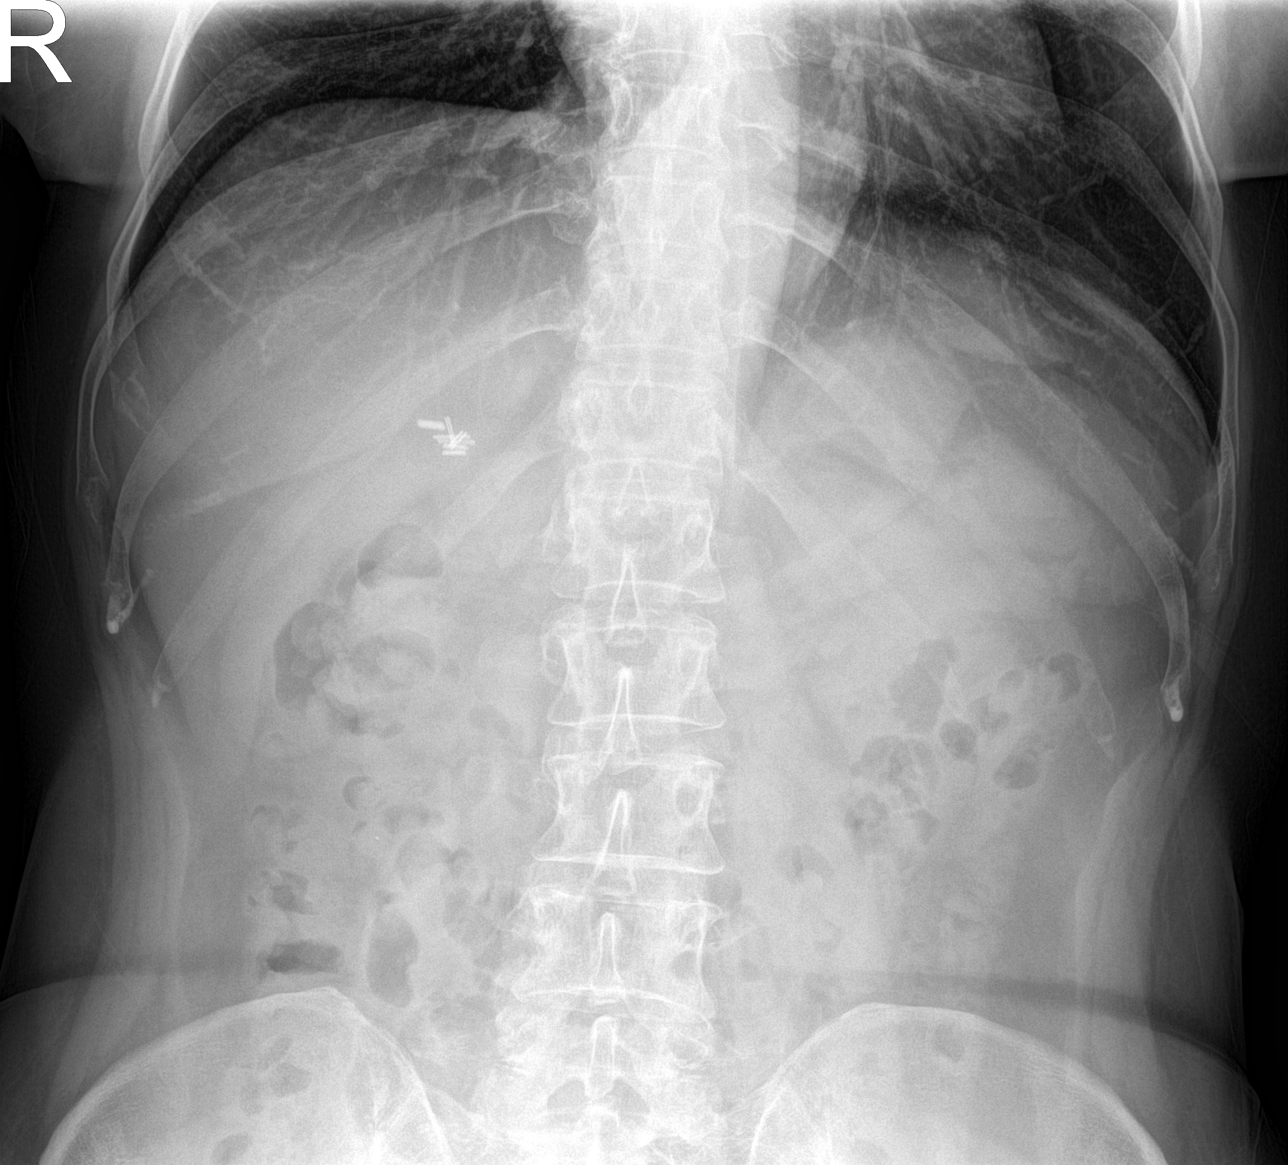

[4 of 4 positions shown; findings below may reference images not displayed]

FINDINGS: Normal heart size and pulmonary vascularity. No focal airspace
disease or consolidation in the lungs. No blunting of costophrenic
angles. No pneumothorax. Mediastinal contours appear intact.

Scattered gas and stool in the colon. No small or large bowel
distention. No free intra-abdominal air. No abnormal air-fluid
levels. No radiopaque stones. Visualized bones appear intact.
Surgical clips in the right upper quadrant. Benign-appearing
sclerosis in the left sacral ala.
IMPRESSION: No evidence of active pulmonary disease. Normal nonobstructive bowel
gas pattern.

## 2015-09-27 DIAGNOSIS — G8929 Other chronic pain: Secondary | ICD-10-CM | POA: Insufficient documentation

## 2015-09-27 DIAGNOSIS — M542 Cervicalgia: Secondary | ICD-10-CM

## 2015-10-17 ENCOUNTER — Ambulatory Visit (INDEPENDENT_AMBULATORY_CARE_PROVIDER_SITE_OTHER): Payer: Medicare Other | Admitting: Internal Medicine

## 2015-10-17 ENCOUNTER — Encounter: Payer: Self-pay | Admitting: Internal Medicine

## 2015-10-17 VITALS — BP 110/70 | HR 96 | Temp 97.7°F | Resp 16

## 2015-10-17 DIAGNOSIS — J4530 Mild persistent asthma, uncomplicated: Secondary | ICD-10-CM

## 2015-10-17 DIAGNOSIS — J3089 Other allergic rhinitis: Secondary | ICD-10-CM

## 2015-10-17 LAB — PULMONARY FUNCTION TEST

## 2015-10-17 NOTE — Patient Instructions (Signed)
Mild persistent asthma  Currently well controlled  Continue Singulair (montelukast) 10 mg daily  Continue as needed albuterol  Other allergic rhinitis  Start nasal saline lavage twice a day prior to nasal spray. Also use Mucinex twice a day.  Continue cetirizine alternating with fexofenadine  Continue fluticasone nasal spray and lubricating eye drops

## 2015-10-17 NOTE — Progress Notes (Signed)
History of Present Illness: Holly Rogers is a 55 y.o. female presenting for follow-up.  HPI Comments: Asthma/Sarcoidosis: Pt was treated with Jerilynn Som and a steroid injection on last visit due to persistent symptoms of wheezing and cough. On this regimen, she improved and she is now back to her baseline.  Allergic rhinitis: Since her last visit, patient had an upper respiratory infection and was treated with amoxicillin, azithromycin, prednisone. She improved until a few days ago when she developed rhinorrhea, headache, Ezel congestion. She had a fever a few days ago that has resolved. She continues to have watery burning eyes.   Assessment and Plan: Mild persistent asthma  Currently well controlled  Continue Singulair (montelukast) 10 mg daily  Continue as needed albuterol  Other allergic rhinitis  Start nasal saline lavage twice a day prior to nasal spray. Also use Mucinex twice a day.  Continue cetirizine alternating with fexofenadine  Continue fluticasone nasal spray and lubricating eye drops    Return in about 6 months (around 04/15/2016).  Medications ordered this encounter:  Meds ordered this encounter  Medications  . b complex vitamins capsule    Sig: Take by mouth.  . benzonatate (TESSALON PERLES) 100 MG capsule    Sig: Take by mouth.  . Vitamin D, Ergocalciferol, (DRISDOL) 50000 units CAPS capsule    Sig: TAKE 1 CAPSULE BY MOUTH EVERY WEEK  . glucose blood (ONETOUCH VERIO) test strip    Sig: USE 1 STRIP TO SKIN TWICE DAILY.  DX CODE: E11.9  . DISCONTD: HYDROcodone-acetaminophen (NORCO) 5-325 MG tablet    Sig: Take by mouth.  . hydroxypropyl methylcellulose (ISOPTO TEARS) 2.5 % ophthalmic solution    Sig: 1 drop.  Marland Kitchen DISCONTD: ipratropium-albuterol (DUONEB) 0.5-2.5 (3) MG/3ML SOLN    Sig: Take by nebulization every 4 (four) hours as needed. 1 ampule  . Lancet Device MISC    Sig: by Does not apply route.  . levalbuterol (XOPENEX) 1.25 MG/3ML nebulizer  solution    Sig: Take 3 mLs (1.25 mg total) by nebulization every 4 (four) hours as needed for Wheezing.  Marland Kitchen DISCONTD: ondansetron (ZOFRAN ODT) 8 MG disintegrating tablet    Sig: Take by mouth.  . pregabalin (LYRICA) 50 MG capsule    Sig: Take by mouth.  . senna (SENOKOT) 8.6 MG tablet    Sig: Take by mouth.  . Vitamin D, Cholecalciferol, 400 units CAPS    Sig: Take by mouth. Reported on 10/17/2015  . HYDROcodone-acetaminophen (NORCO/VICODIN) 5-325 MG tablet    Sig: Take 1 tablet by mouth 2 (two) times daily.  Marland Kitchen DISCONTD: fluticasone (FLONASE) 50 MCG/ACT nasal spray    Sig: Place 2 sprays into both nostrils as needed for allergies or rhinitis.    Diagnostics: Spirometry: FEV1 2.7L or 102%, FEV1/FVC  81%.  This is a normal study  Physical Exam: BP 110/70 mmHg  Pulse 96  Temp(Src) 97.7 F (36.5 C) (Oral)  Resp 16   Physical Exam  Constitutional: She appears well-developed and well-nourished. No distress.  HENT:  Right Ear: External ear normal.  Left Ear: External ear normal.  Nose: Nose normal.  Mouth/Throat: Oropharynx is clear and moist.  Eyes: Conjunctivae are normal. Right eye exhibits no discharge. Left eye exhibits no discharge.  Cardiovascular: Normal rate, regular rhythm and normal heart sounds.   No murmur heard. Pulmonary/Chest: Effort normal and breath sounds normal. No respiratory distress. She has no wheezes. She has no rales.  Abdominal: Soft. Bowel sounds are normal.  Musculoskeletal: She exhibits no  edema.  Lymphadenopathy:    She has no cervical adenopathy.  Neurological: She is alert.  Skin: No rash noted.  Vitals reviewed.   Medications: Current outpatient prescriptions:  .  albuterol (PROAIR HFA) 108 (90 BASE) MCG/ACT inhaler, Inhale 2 puffs into the lungs every 6 (six) hours as needed for wheezing or shortness of breath., Disp: , Rfl:  .  alprazolam (XANAX) 2 MG tablet, Take 2 mg by mouth 2 (two) times daily as needed for sleep or anxiety. , Disp: ,  Rfl:  .  b complex vitamins capsule, Take by mouth., Disp: , Rfl:  .  benzonatate (TESSALON PERLES) 100 MG capsule, Take by mouth., Disp: , Rfl:  .  cetirizine (ZYRTEC) 10 MG tablet, Take 10 mg by mouth daily., Disp: , Rfl:  .  Cyanocobalamin (B-12 PO), Take 1 tablet by mouth daily., Disp: , Rfl:  .  diclofenac sodium (VOLTAREN) 1 % GEL, Apply 1 application topically 3 (three) times daily as needed (for pain). , Disp: , Rfl:  .  fexofenadine (ALLEGRA) 180 MG tablet, Take 180 mg by mouth daily., Disp: , Rfl:  .  fluticasone (FLONASE) 50 MCG/ACT nasal spray, Place 2 sprays into both nostrils daily., Disp: , Rfl:  .  glucose blood (ONETOUCH VERIO) test strip, USE 1 STRIP TO SKIN TWICE DAILY.  DX CODE: E11.9, Disp: , Rfl:  .  HYDROcodone-acetaminophen (NORCO/VICODIN) 5-325 MG tablet, Take 1 tablet by mouth 2 (two) times daily., Disp: , Rfl:  .  hydroxypropyl methylcellulose (ISOPTO TEARS) 2.5 % ophthalmic solution, 1 drop., Disp: , Rfl:  .  ibuprofen (ADVIL,MOTRIN) 200 MG tablet, Take 200 mg by mouth every 6 (six) hours as needed., Disp: , Rfl:  .  Lancet Device MISC, by Does not apply route., Disp: , Rfl:  .  levalbuterol (XOPENEX) 1.25 MG/3ML nebulizer solution, Take 3 mLs (1.25 mg total) by nebulization every 4 (four) hours as needed for Wheezing., Disp: , Rfl:  .  montelukast (SINGULAIR) 10 MG tablet, Take 10 mg by mouth at bedtime., Disp: , Rfl:  .  ondansetron (ZOFRAN) 4 MG tablet, Take 1 tablet (4 mg total) by mouth every 6 (six) hours., Disp: 12 tablet, Rfl: 0 .  polyethylene glycol powder (MIRALAX) powder, Take 17 g by mouth daily., Disp: 255 g, Rfl: 0 .  potassium chloride SA (K-DUR,KLOR-CON) 20 MEQ tablet, Take 20 mEq by mouth daily., Disp: , Rfl:  .  pregabalin (LYRICA) 50 MG capsule, Take by mouth., Disp: , Rfl:  .  promethazine (PHENERGAN) 25 MG tablet, Take 1 tablet (25 mg total) by mouth every 6 (six) hours as needed for nausea or vomiting., Disp: 30 tablet, Rfl: 0 .  senna (SENOKOT)  8.6 MG tablet, Take by mouth., Disp: , Rfl:  .  tizanidine (ZANAFLEX) 2 MG capsule, Take 2 mg by mouth 3 (three) times daily as needed for muscle spasms., Disp: , Rfl:  .  triamcinolone cream (KENALOG) 0.1 %, Apply 1 application topically daily as needed (for dry skin). , Disp: , Rfl:  .  Vitamin D, Ergocalciferol, (DRISDOL) 50000 units CAPS capsule, TAKE 1 CAPSULE BY MOUTH EVERY WEEK, Disp: , Rfl:  .  amoxicillin (AMOXIL) 500 MG capsule, Take 1 capsule (500 mg total) by mouth 2 (two) times daily. (Patient not taking: Reported on 10/17/2015), Disp: 14 capsule, Rfl: 0 .  dicyclomine (BENTYL) 10 MG capsule, Take 10 mg by mouth 4 (four) times daily -  before meals and at bedtime. Reported on 10/17/2015, Disp: , Rfl:  .  hydrocortisone 2.5 % cream, Apply 1 application topically 2 (two) times daily. Reported on 10/17/2015, Disp: , Rfl:  .  oxyCODONE-acetaminophen (PERCOCET/ROXICET) 5-325 MG per tablet, Take 1-2 tablets by mouth every 6 (six) hours as needed for severe pain. (Patient not taking: Reported on 10/17/2015), Disp: 30 tablet, Rfl: 0 .  triamcinolone (NASACORT ALLERGY 24HR) 55 MCG/ACT AERO nasal inhaler, Place 2 sprays into the nose daily as needed (for stuffy nose). Reported on 10/17/2015, Disp: , Rfl:  .  Vitamin D, Cholecalciferol, 400 units CAPS, Take by mouth. Reported on 10/17/2015, Disp: , Rfl:  .  [DISCONTINUED] sodium chloride (OCEAN) 0.65 % SOLN nasal spray, Place 1 spray into both nostrils as needed for congestion. (Patient not taking: Reported on 06/15/2015), Disp: 60 mL, Rfl: 0  Drug Allergies:  Allergies  Allergen Reactions  . Amoxicillin Nausea Only  . Avandia [Rosiglitazone]     Chest pain  . Baclofen Nausea Only    Unknown, patient knows she couldn't take it  . Ceftin [Cefuroxime Axetil]     unknown  . Celebrex [Celecoxib]     Chest pain  . Ciprofloxacin Nausea And Vomiting  . Clindamycin/Lincomycin     unknown  . Doxycycline     unknown  . Flagyl [Metronidazole] Nausea And  Vomiting  . Levaquin [Levofloxacin In D5w]     unknown  . Levofloxacin Other (See Comments)  . Macrobid [employer] Macro]     unknown  . Macrodantin [Nitrofurantoin Macrocrystal]   . Metformin And Related     Chest pain  . Neurontin [Gabapentin]     Gave patient problems  . Other Nausea And Vomiting    Darvocet  . Sulfa Antibiotics Nausea And Vomiting  . Symbicort [Budesonide-Formoterol Fumarate]     Shakes  . Tramadol Hcl   . Ultram [Tramadol]     unknown  . Valdecoxib Other (See Comments)    Unknown  . Vancomycin Nausea And Vomiting  . Vioxx [Rofecoxib]     unknown  . Voltaren [Diclofenac Sodium]     Unknown. But she can use the gel  . Tape Rash    Can use paper tape    ROS: Per HPI unless specifically indicated below Review of Systems  Thank you for the opportunity to care for this patient.  Please do not hesitate to contact me with questions.

## 2015-10-17 NOTE — Assessment & Plan Note (Signed)
   Start nasal saline lavage twice a day prior to nasal spray. Also use Mucinex twice a day.  Continue cetirizine alternating with fexofenadine  Continue fluticasone nasal spray and lubricating eye drops

## 2015-10-17 NOTE — Assessment & Plan Note (Signed)
   Currently well controlled  Continue Singulair (montelukast) 10 mg daily  Continue as needed albuterol

## 2015-10-18 ENCOUNTER — Emergency Department (HOSPITAL_BASED_OUTPATIENT_CLINIC_OR_DEPARTMENT_OTHER): Payer: Medicare Other

## 2015-10-18 ENCOUNTER — Inpatient Hospital Stay (HOSPITAL_BASED_OUTPATIENT_CLINIC_OR_DEPARTMENT_OTHER)
Admission: EM | Admit: 2015-10-18 | Discharge: 2015-10-18 | DRG: 392 | Disposition: A | Discharge status: Other Acute Inpatient Hospital | Payer: Medicare Other | Attending: Family Medicine | Admitting: Family Medicine

## 2015-10-18 ENCOUNTER — Encounter (HOSPITAL_BASED_OUTPATIENT_CLINIC_OR_DEPARTMENT_OTHER): Payer: Self-pay | Admitting: Emergency Medicine

## 2015-10-18 DIAGNOSIS — J449 Chronic obstructive pulmonary disease, unspecified: Secondary | ICD-10-CM | POA: Diagnosis not present

## 2015-10-18 DIAGNOSIS — Z79899 Other long term (current) drug therapy: Secondary | ICD-10-CM | POA: Diagnosis not present

## 2015-10-18 DIAGNOSIS — E119 Type 2 diabetes mellitus without complications: Secondary | ICD-10-CM | POA: Diagnosis not present

## 2015-10-18 DIAGNOSIS — M1990 Unspecified osteoarthritis, unspecified site: Secondary | ICD-10-CM | POA: Diagnosis not present

## 2015-10-18 DIAGNOSIS — E876 Hypokalemia: Secondary | ICD-10-CM | POA: Insufficient documentation

## 2015-10-18 DIAGNOSIS — K5720 Diverticulitis of large intestine with perforation and abscess without bleeding: Secondary | ICD-10-CM | POA: Diagnosis not present

## 2015-10-18 DIAGNOSIS — K5790 Diverticulosis of intestine, part unspecified, without perforation or abscess without bleeding: Secondary | ICD-10-CM | POA: Diagnosis present

## 2015-10-18 DIAGNOSIS — R1031 Right lower quadrant pain: Secondary | ICD-10-CM

## 2015-10-18 DIAGNOSIS — K5732 Diverticulitis of large intestine without perforation or abscess without bleeding: Secondary | ICD-10-CM | POA: Insufficient documentation

## 2015-10-18 DIAGNOSIS — K219 Gastro-esophageal reflux disease without esophagitis: Secondary | ICD-10-CM | POA: Diagnosis present

## 2015-10-18 DIAGNOSIS — I341 Nonrheumatic mitral (valve) prolapse: Secondary | ICD-10-CM | POA: Diagnosis present

## 2015-10-18 DIAGNOSIS — M797 Fibromyalgia: Secondary | ICD-10-CM | POA: Diagnosis present

## 2015-10-18 DIAGNOSIS — R109 Unspecified abdominal pain: Secondary | ICD-10-CM | POA: Diagnosis present

## 2015-10-18 DIAGNOSIS — K625 Hemorrhage of anus and rectum: Secondary | ICD-10-CM | POA: Insufficient documentation

## 2015-10-18 LAB — COMPREHENSIVE METABOLIC PANEL
ALT: 31 U/L (ref 14–54)
AST: 25 U/L (ref 15–41)
Albumin: 3.8 g/dL (ref 3.5–5.0)
Alkaline Phosphatase: 92 U/L (ref 38–126)
Anion gap: 4 — ABNORMAL LOW (ref 5–15)
BUN: 7 mg/dL (ref 6–20)
CO2: 27 mmol/L (ref 22–32)
Calcium: 8.7 mg/dL — ABNORMAL LOW (ref 8.9–10.3)
Chloride: 106 mmol/L (ref 101–111)
Creatinine, Ser: 0.79 mg/dL (ref 0.44–1.00)
GFR calc Af Amer: >60 mL/min (ref >60)
GFR calc non Af Amer: >60 mL/min (ref >60)
Glucose, Bld: 102 mg/dL — ABNORMAL HIGH (ref 65–99)
Potassium: 3.4 mmol/L — ABNORMAL LOW (ref 3.5–5.1)
Sodium: 137 mmol/L (ref 135–145)
Total Bilirubin: 0.6 mg/dL (ref 0.3–1.2)
Total Protein: 7.2 g/dL (ref 6.5–8.1)

## 2015-10-18 LAB — URINALYSIS, ROUTINE W REFLEX MICROSCOPIC
Bilirubin Urine: NEGATIVE
GLUCOSE, UA: NEGATIVE mg/dL
Hgb urine dipstick: NEGATIVE
Ketones, ur: NEGATIVE mg/dL
LEUKOCYTES UA: NEGATIVE
Nitrite: NEGATIVE
PH: 6.5 (ref 5.0–8.0)
PROTEIN: NEGATIVE mg/dL
Specific Gravity, Urine: 1.015 (ref 1.005–1.030)

## 2015-10-18 LAB — CBC WITH DIFFERENTIAL/PLATELET
Basophils Absolute: 0 10*3/uL (ref 0.0–0.1)
Basophils Relative: 0 %
Eosinophils Absolute: 0 10*3/uL (ref 0.0–0.7)
Eosinophils Relative: 1 %
HCT: 35.5 % — ABNORMAL LOW (ref 36.0–46.0)
Hemoglobin: 11.3 g/dL — ABNORMAL LOW (ref 12.0–15.0)
Lymphocytes Relative: 17 %
Lymphs Abs: 1.4 10*3/uL (ref 0.7–4.0)
MCH: 30.9 pg (ref 26.0–34.0)
MCHC: 31.8 g/dL (ref 30.0–36.0)
MCV: 97 fL (ref 78.0–100.0)
Monocytes Absolute: 1 10*3/uL (ref 0.1–1.0)
Monocytes Relative: 12 %
Neutro Abs: 5.8 10*3/uL (ref 1.7–7.7)
Neutrophils Relative %: 70 %
Platelets: 222 10*3/uL (ref 150–400)
RBC: 3.66 MIL/uL — ABNORMAL LOW (ref 3.87–5.11)
RDW: 12 % (ref 11.5–15.5)
WBC: 8.1 10*3/uL (ref 4.0–10.5)

## 2015-10-18 LAB — WET PREP, GENITAL
Clue Cells Wet Prep HPF POC: NONE SEEN
Sperm: NONE SEEN
Trich, Wet Prep: NONE SEEN
Yeast Wet Prep HPF POC: NONE SEEN

## 2015-10-18 LAB — LIPASE, BLOOD: Lipase: 23 U/L (ref 11–51)

## 2015-10-18 LAB — PREGNANCY, URINE: Preg Test, Ur: NEGATIVE

## 2015-10-18 MED ORDER — IOHEXOL 300 MG/ML  SOLN
100.0000 mL | Freq: Once | INTRAMUSCULAR | Status: AC | PRN
  Administered 2015-10-18: 100 mL via INTRAVENOUS

## 2015-10-18 MED ORDER — KETOROLAC TROMETHAMINE 30 MG/ML IJ SOLN
30.0000 mg | Freq: Once | INTRAMUSCULAR | Status: AC
  Administered 2015-10-18: 30 mg via INTRAVENOUS
  Filled 2015-10-18: qty 1

## 2015-10-18 MED ORDER — PIPERACILLIN-TAZOBACTAM 3.375 G IVPB
INTRAVENOUS | Status: AC
  Filled 2015-10-18: qty 100

## 2015-10-18 MED ORDER — IOHEXOL 300 MG/ML  SOLN
50.0000 mL | Freq: Once | INTRAMUSCULAR | Status: AC | PRN
  Administered 2015-10-18: 50 mL via ORAL

## 2015-10-18 MED ORDER — SODIUM CHLORIDE 0.9 % IV BOLUS (SEPSIS)
1000.0000 mL | Freq: Once | INTRAVENOUS | Status: AC
  Administered 2015-10-18: 1000 mL via INTRAVENOUS

## 2015-10-18 MED ORDER — PIPERACILLIN-TAZOBACTAM 4.5 G IVPB
4.5000 g | Freq: Once | INTRAVENOUS | Status: AC
  Administered 2015-10-18: 4.5 g via INTRAVENOUS
  Filled 2015-10-18: qty 100

## 2015-10-18 MED ORDER — ONDANSETRON HCL 4 MG/2ML IJ SOLN
4.0000 mg | Freq: Once | INTRAMUSCULAR | Status: AC
  Administered 2015-10-18: 4 mg via INTRAVENOUS
  Filled 2015-10-18: qty 2

## 2015-10-18 NOTE — Progress Notes (Signed)
Transfer from [facility].  39 yof with h/o diverticulitis p/w 1 wk of progressive abd pain with nausea and loss of appetite.  Tachycardic with temp 37.9, no leukocytosis. CT abd/pelvis with sigmoid diverticulitis and intramural abscess (non-drainable per radiology). Tachycardia resolved with IVF.  BP has been stable with SBP 115-140.  Allergy to cipro and Flagyl, started on Zosyn.

## 2015-10-18 NOTE — ED Provider Notes (Signed)
CSN: [id number]     Arrival date & time 10/18/15  1349 History   First MD Initiated Contact with Patient 10/18/15 1450     Chief Complaint  Patient presents with  . Abdominal Pain  . Headache    HPI   55 year old female presents stating with numerous complaints. Patient reports a 2-3 weeks ago she had sexual intercourse, notes burning with urinations after the intercourse. She denies any vaginal discharge, pain to her pelvis. She notes that she started developing right lower quadrant pain over the last several days, this pain has become more severe today. She reports that eating and drinking makes the pain significantly worse. She reports she is able to tolerate food and drink at this time. She notes that yesterday she started having diarrhea after taking MiraLAX for constipation. She denies any blood, mucus in her bowel movements. Patient additionally notes a slow onset of a headache to the top of her head, denies any neurological deficits, or any red flags for head pain. She denies any neck stiffness decreased range of motion, rash. She additionally reports a dry cough, and sore throat that started around the same time of her headache. She reports seeing her allergist, who did not prescribe any medications that she had recently taken amoxicillin for sinusitis. Patient reports fever and chills over the last several days.   Past Medical History  Diagnosis Date  . Sarcoidosis (HCC)   . COPD (chronic obstructive pulmonary disease) (HCC)   . Arthritis   . Acid reflux   . Mitral valve prolapse   . Diverticulosis   . Pancreatitis 1997  . Sepsis (HCC)   . Pneumonia   . Sepsis (HCC)   . Diabetes mellitus without complication (HCC)   . Fibromyalgia   . COPD (chronic obstructive pulmonary disease) (HCC)   . DDD (degenerative disc disease), lumbar    Past Surgical History  Procedure Laterality Date  . Cholecystectomy    . Abdominal hysterectomy     Family History  Problem Relation Age of  Onset  . Asthma Mother   . COPD Mother     smoked  . Rheum arthritis Mother   . Skin cancer Mother    Social History  Substance Use Topics  . Smoking status: Never Smoker   . Smokeless tobacco: Never Used  . Alcohol Use: No   OB History    No data available     Review of Systems  All other systems reviewed and are negative.   Allergies  Amoxicillin; Avandia; Baclofen; Ceftin; Celebrex; Ciprofloxacin; Clindamycin/lincomycin; Doxycycline; Flagyl; Levaquin; Levofloxacin; Macrobid; Macrodantin; Metformin and related; Neurontin; Other; Sulfa antibiotics; Symbicort; Tramadol hcl; Ultram; Valdecoxib; Vancomycin; Vioxx; Voltaren; and Tape  Home Medications   Prior to Admission medications   Medication Sig Start Date End Date Taking? Authorizing Provider  albuterol (PROAIR HFA) 108 (90 BASE) MCG/ACT inhaler Inhale 2 puffs into the lungs every 6 (six) hours as needed for wheezing or shortness of breath.    Historical Provider, MD  alprazolam Prudy Feeler) 2 MG tablet Take 2 mg by mouth 2 (two) times daily as needed for sleep or anxiety.     Historical Provider, MD  b complex vitamins capsule Take by mouth.    Historical Provider, MD  benzonatate (TESSALON PERLES) 100 MG capsule Take by mouth. 09/20/15 09/19/16  Historical Provider, MD  cetirizine (ZYRTEC) 10 MG tablet Take 10 mg by mouth daily.    Historical Provider, MD  Cyanocobalamin (B-12 PO) Take 1 tablet by mouth daily.  Historical Provider, MD  diclofenac sodium (VOLTAREN) 1 % GEL Apply 1 application topically 3 (three) times daily as needed (for pain).  01/18/14   Historical Provider, MD  dicyclomine (BENTYL) 10 MG capsule Take 10 mg by mouth 4 (four) times daily -  before meals and at bedtime. Reported on 10/17/2015    Historical Provider, MD  fexofenadine (ALLEGRA) 180 MG tablet Take 180 mg by mouth daily.    Historical Provider, MD  fluticasone (FLONASE) 50 MCG/ACT nasal spray Place 2 sprays into both nostrils daily.    Historical Provider,  MD  glucose blood (ONETOUCH VERIO) test strip USE 1 STRIP TO SKIN TWICE DAILY.  DX CODE: E11.9 03/28/15   Historical Provider, MD  HYDROcodone-acetaminophen (NORCO/VICODIN) 5-325 MG tablet Take 1 tablet by mouth 2 (two) times daily.    Historical Provider, MD  hydrocortisone 2.5 % cream Apply 1 application topically 2 (two) times daily. Reported on 10/17/2015    Historical Provider, MD  hydroxypropyl methylcellulose (ISOPTO TEARS) 2.5 % ophthalmic solution 1 drop. 12/14/14   Historical Provider, MD  ibuprofen (ADVIL,MOTRIN) 200 MG tablet Take 200 mg by mouth every 6 (six) hours as needed.    Historical Provider, MD  Lancet Device MISC by Does not apply route. 03/28/15 03/27/16  Historical Provider, MD  levalbuterol (XOPENEX) 1.25 MG/3ML nebulizer solution Take 3 mLs (1.25 mg total) by nebulization every 4 (four) hours as needed for Wheezing. 11/16/14 11/16/15  Historical Provider, MD  montelukast (SINGULAIR) 10 MG tablet Take 10 mg by mouth at bedtime. 12/02/13   Historical Provider, MD  polyethylene glycol powder (MIRALAX) powder Take 17 g by mouth daily. 12/25/14   April Palumbo, MD  potassium chloride SA (K-DUR,KLOR-CON) 20 MEQ tablet Take 20 mEq by mouth daily. 01/17/14   Historical Provider, MD  pregabalin (LYRICA) 50 MG capsule Take by mouth. 08/29/15 08/28/16  Historical Provider, MD  promethazine (PHENERGAN) 25 MG tablet Take 1 tablet (25 mg total) by mouth every 6 (six) hours as needed for nausea or vomiting. 03/06/15   Gwyneth Sprout, MD  senna (SENOKOT) 8.6 MG tablet Take by mouth.    Historical Provider, MD  tizanidine (ZANAFLEX) 2 MG capsule Take 2 mg by mouth 3 (three) times daily as needed for muscle spasms.    Historical Provider, MD  triamcinolone (NASACORT ALLERGY 24HR) 55 MCG/ACT AERO nasal inhaler Place 2 sprays into the nose daily as needed (for stuffy nose). Reported on 10/17/2015    Historical Provider, MD  triamcinolone cream (KENALOG) 0.1 % Apply 1 application topically daily as needed (for  dry skin).  08/17/13   Historical Provider, MD  Vitamin D, Cholecalciferol, 400 units CAPS Take by mouth. Reported on 10/17/2015 07/09/07   Historical Provider, MD  Vitamin D, Ergocalciferol, (DRISDOL) 50000 units CAPS capsule TAKE 1 CAPSULE BY MOUTH EVERY WEEK 12/29/14   Historical Provider, MD   BP 138/97 mmHg  Pulse 104  Temp(Src) 100.3 F (37.9 C) (Oral)  Resp 28  Ht 5\' 8"  (1.727 m)  Wt 75.297 kg  BMI 25.25 kg/m2  SpO2 99%    Physical Exam  Constitutional: She is oriented to person, place, and time. She appears well-developed and well-nourished.  HENT:  Head: Normocephalic and atraumatic.  Right Ear: Hearing and tympanic membrane normal.  Left Ear: Hearing and tympanic membrane normal.  Nose: Nose normal.  Mouth/Throat: Uvula is midline, oropharynx is clear and moist and mucous membranes are normal. No oropharyngeal exudate, posterior oropharyngeal edema, posterior oropharyngeal erythema or tonsillar abscesses.  Eyes:  Conjunctivae are normal. Pupils are equal, round, and reactive to light. Right eye exhibits no discharge. Left eye exhibits no discharge. No scleral icterus.  Neck: Normal range of motion. No JVD present. No tracheal deviation present.  Cardiovascular: Regular rhythm, normal heart sounds and intact distal pulses.  Exam reveals no gallop and no friction rub.   No murmur heard. Pulmonary/Chest: Effort normal and breath sounds normal. No stridor. No respiratory distress. She has no wheezes. She has no rales. She exhibits no tenderness.  Abdominal: Soft. She exhibits no distension. There is tenderness. There is no rebound and no guarding.  Genitourinary: Rectum normal.  SP hysterectomy, no vaginal discharge, injury or significant tenderness   Musculoskeletal: Normal range of motion. She exhibits no edema or tenderness.  Neurological: She is alert and oriented to person, place, and time. Coordination normal.  Skin: Skin is warm and dry. No rash noted. No erythema. No pallor.   Psychiatric: She has a normal mood and affect. Her behavior is normal. Judgment and thought content normal.  Nursing note and vitals reviewed.   ED Course  Procedures (including critical care time) Labs Review Labs Reviewed  WET PREP, GENITAL - Abnormal; Notable for the following:    WBC, Wet Prep HPF POC MANY (*)    All other components within normal limits  CBC WITH DIFFERENTIAL/PLATELET - Abnormal; Notable for the following:    RBC 3.66 (*)    Hemoglobin 11.3 (*)    HCT 35.5 (*)    All other components within normal limits  COMPREHENSIVE METABOLIC PANEL - Abnormal; Notable for the following:    Potassium 3.4 (*)    Glucose, Bld 102 (*)    Calcium 8.7 (*)    Anion gap 4 (*)    All other components within normal limits  URINE CULTURE  URINALYSIS, ROUTINE W REFLEX MICROSCOPIC (NOT AT [facility])  LIPASE, BLOOD  PREGNANCY, URINE  GC/CHLAMYDIA PROBE AMP ([place]) NOT AT [facility]    Imaging Review No results found. I have personally reviewed and evaluated these images and lab results as part of my medical decision-making.   EKG Interpretation None      MDM   Final diagnoses:  Right lower quadrant abdominal pain    Labs:  CBC, CMP, Lipase, urinalysis - pending  Imaging: CT abdomen and pelvis   Consults:    Therapeutics: NS Toradol  Discharge Meds:   Assessment/Plan: 55 year old female presents today with numerous complaints. Patient reports headache, states this is on the top of her head she has no neurological deficits or any red flags for headache. She was treated here with Toradol with symptomatic improvement in her headache. Patient also had upper respiratory complaints, has seen her allergist, who recommends no antibiotics at this time as patient recently had gotten off a course of amoxicillin and prednisone. Patient has diarrhea here this is likely due to her taking MiraLAX in anticipation of constipation from eating so much from her prednisone. She has developed a  dermatitis type rash to her groin, she is instructed to continue using over-the-counter medication as it seems to be improving her condition. She is instructed to maintain adequate cleanliness. Patient's care will be signed off to the oncoming provider pending CT abdomen and pelvis. At this time she has no elevation in white count no significant electrolyte abnormalities are abnormal kidney function, her temperature is 100.3 she is mildly tachycardic receiving normal saline and Toradol. Patient continues to have right lower quadrant pain, but is able to maneuver  on the bed without significant difficulty or appearance of pain. Patient's disposition pending CT abdomen and pelvis. I spoke with the patient personally here is aware of all relevant findings at this point.          Eyvonne Mechanic, PA-C 10/18/15 1711  Lyndal Pulley, MD 10/18/15 [phone]

## 2015-10-18 NOTE — ED Provider Notes (Signed)
Patient care assumed from [facility], PA-C at shift change pending CT scan results. For full history of present illness, see note by initial provider.  In short, 55 year old female presenting with approximately one week of right-sided abdominal pain which has worsened over the past day. Oral intake exacerbates the pain. She endorses diarrhea but has taken MiraLAX recently. She denies blood in her stool. Has a history of diverticulitis. Febrile to 100.3. Tenderness noted in the abdomen without peritoneal signs. No elevated white count. Kidney function normal. CT of the abdomen shows diverticulitis with small abscess. Started patient on Zosyn due to allergies to ciprofloxacin and Flagyl. Patient was originally going to be admitted to [facility] but she states that she refuses to go back there. Discussed with [place] regional Hospitalist who will admit the patient.  Filed Vitals:   10/18/15 1523 10/18/15 1524 10/18/15 1810 10/18/15 1828  BP: 138/97 138/97 115/78 115/78  Pulse: 104 103 93 91  Temp: 100.3 F (37.9 C)     TempSrc: Oral     Resp: 28 20 20 19   Height:      Weight:      SpO2: 99% 99% 99% 98%   Results for orders placed or performed during the hospital encounter of 10/18/15 (from the past 48 hour(s))  Urinalysis, Routine w reflex microscopic (not at [facility])     Status: None   Collection Time: 10/18/15  2:10 PM  Result Value Ref Range   Color, Urine YELLOW YELLOW   APPearance CLEAR CLEAR   Specific Gravity, Urine 1.015 1.005 - 1.030   pH 6.5 5.0 - 8.0   Glucose, UA NEGATIVE NEGATIVE mg/dL   Hgb urine dipstick NEGATIVE NEGATIVE   Bilirubin Urine NEGATIVE NEGATIVE   Ketones, ur NEGATIVE NEGATIVE mg/dL   Protein, ur NEGATIVE NEGATIVE mg/dL   Nitrite NEGATIVE NEGATIVE   Leukocytes, UA NEGATIVE NEGATIVE    Comment: MICROSCOPIC NOT DONE ON URINES WITH NEGATIVE PROTEIN, BLOOD, LEUKOCYTES, NITRITE, OR GLUCOSE <1000 mg/dL.  Pregnancy, urine     Status: None   Collection Time:  10/18/15  2:10 PM  Result Value Ref Range   Preg Test, Ur NEGATIVE NEGATIVE    Comment:        THE SENSITIVITY OF THIS METHODOLOGY IS >20 mIU/mL.   CBC with Differential     Status: Abnormal   Collection Time: 10/18/15  3:45 PM  Result Value Ref Range   WBC 8.1 4.0 - 10.5 K/uL   RBC 3.66 (L) 3.87 - 5.11 MIL/uL   Hemoglobin 11.3 (L) 12.0 - 15.0 g/dL   HCT 35.5 (L) 36.0 - 46.0 %   MCV 97.0 78.0 - 100.0 fL   MCH 30.9 26.0 - 34.0 pg   MCHC 31.8 30.0 - 36.0 g/dL   RDW 12.0 11.5 - 15.5 %   Platelets 222 150 - 400 K/uL   Neutrophils Relative % 70 %   Neutro Abs 5.8 1.7 - 7.7 K/uL   Lymphocytes Relative 17 %   Lymphs Abs 1.4 0.7 - 4.0 K/uL   Monocytes Relative 12 %   Monocytes Absolute 1.0 0.1 - 1.0 K/uL   Eosinophils Relative 1 %   Eosinophils Absolute 0.0 0.0 - 0.7 K/uL   Basophils Relative 0 %   Basophils Absolute 0.0 0.0 - 0.1 K/uL  Comprehensive metabolic panel     Status: Abnormal   Collection Time: 10/18/15  3:45 PM  Result Value Ref Range   Sodium 137 135 - 145 mmol/L   Potassium 3.4 (  L) 3.5 - 5.1 mmol/L   Chloride 106 101 - 111 mmol/L   CO2 27 22 - 32 mmol/L   Glucose, Bld 102 (H) 65 - 99 mg/dL   BUN 7 6 - 20 mg/dL   Creatinine, Ser 0.79 0.44 - 1.00 mg/dL   Calcium 8.7 (L) 8.9 - 10.3 mg/dL   Total Protein 7.2 6.5 - 8.1 g/dL   Albumin 3.8 3.5 - 5.0 g/dL   AST 25 15 - 41 U/L   ALT 31 14 - 54 U/L   Alkaline Phosphatase 92 38 - 126 U/L   Total Bilirubin 0.6 0.3 - 1.2 mg/dL   GFR calc non Af Amer >60 >60 mL/min   GFR calc Af Amer >60 >60 mL/min    Comment: (NOTE) The eGFR has been calculated using the CKD EPI equation. This calculation has not been validated in all clinical situations. eGFR's persistently <60 mL/min signify possible Chronic Kidney Disease.    Anion gap 4 (L) 5 - 15  Lipase, blood     Status: None   Collection Time: 10/18/15  3:45 PM  Result Value Ref Range   Lipase 23 11 - 51 U/L  Wet prep, genital     Status: Abnormal   Collection Time:  10/18/15  4:30 PM  Result Value Ref Range   Yeast Wet Prep HPF POC NONE SEEN NONE SEEN   Trich, Wet Prep NONE SEEN NONE SEEN   Clue Cells Wet Prep HPF POC NONE SEEN NONE SEEN   WBC, Wet Prep HPF POC MANY (A) NONE SEEN   Sperm NONE SEEN    Abdomen/Pelvis CT IMPRESSION: Sigmoid diverticulitis with a small intramural abscess identified. No drainable collection is present.  Fatty infiltration of the liver.  Small hiatal hernia      Josephina Gip, PA-C 10/18/15 [place], DO 10/18/15 1953

## 2015-10-18 NOTE — ED Notes (Addendum)
Pt having lower abominal pain with dysuria for two weeks.  It has been worsening today.  Pt is now having a headache.  Pt states she believes she has a sinus infection and might have problems with her bowels, incontinence/diarrhea.  Pt thinks she might have STD.

## 2015-10-18 NOTE — ED Notes (Signed)
PA at bedside.

## 2015-10-18 NOTE — ED Notes (Signed)
Pt states she is not going to [place], but that she is going to [facility] instead.

## 2015-10-19 DIAGNOSIS — R197 Diarrhea, unspecified: Secondary | ICD-10-CM | POA: Insufficient documentation

## 2015-10-19 LAB — GC/CHLAMYDIA PROBE AMP (~~LOC~~) NOT AT ARMC
Chlamydia: NEGATIVE
Neisseria Gonorrhea: NEGATIVE

## 2015-10-20 DIAGNOSIS — E162 Hypoglycemia, unspecified: Secondary | ICD-10-CM | POA: Insufficient documentation

## 2015-10-20 DIAGNOSIS — J329 Chronic sinusitis, unspecified: Secondary | ICD-10-CM | POA: Insufficient documentation

## 2015-10-20 DIAGNOSIS — F419 Anxiety disorder, unspecified: Secondary | ICD-10-CM | POA: Insufficient documentation

## 2015-10-20 DIAGNOSIS — E119 Type 2 diabetes mellitus without complications: Secondary | ICD-10-CM | POA: Insufficient documentation

## 2015-10-20 DIAGNOSIS — Z8619 Personal history of other infectious and parasitic diseases: Secondary | ICD-10-CM | POA: Insufficient documentation

## 2015-10-21 LAB — URINE CULTURE: Culture: >=100000

## 2015-10-22 ENCOUNTER — Telehealth (HOSPITAL_BASED_OUTPATIENT_CLINIC_OR_DEPARTMENT_OTHER): Payer: Self-pay | Admitting: Emergency Medicine

## 2015-10-22 NOTE — Telephone Encounter (Signed)
Post ED Visit - Positive Culture Follow-up  Culture report reviewed by antimicrobial stewardship pharmacist:   Enzo Bi, Pharm.D.  Celedonio Miyamoto, Pharm.D., BCPS  Garvin Fila, Pharm.D.  Georgina Pillion, Pharm.D., BCPS  [place], [place].D., BCPS, AAHIVP  Estella Husk, Pharm.D., BCPS, AAHIVP  Tennis Must, Pharm.D.  Sherle Poe, [address].D.  Positive urine culture E. coli Treated with none, admitted to [facility] for admit and IV antibiotics, culture report faxed to [facility] no further patient follow-up is required at this time.  Berle Mull 10/22/2015, 9:08 AM

## 2015-10-30 DIAGNOSIS — R32 Unspecified urinary incontinence: Secondary | ICD-10-CM | POA: Insufficient documentation

## 2015-11-13 ENCOUNTER — Emergency Department (HOSPITAL_BASED_OUTPATIENT_CLINIC_OR_DEPARTMENT_OTHER): Payer: Medicare Other

## 2015-11-13 ENCOUNTER — Encounter (HOSPITAL_BASED_OUTPATIENT_CLINIC_OR_DEPARTMENT_OTHER): Payer: Self-pay | Admitting: *Deleted

## 2015-11-13 ENCOUNTER — Emergency Department (HOSPITAL_BASED_OUTPATIENT_CLINIC_OR_DEPARTMENT_OTHER)
Admission: EM | Admit: 2015-11-13 | Discharge: 2015-11-14 | Disposition: A | Discharge status: Home / Self Care | Payer: Medicare Other | Attending: Emergency Medicine | Admitting: Emergency Medicine

## 2015-11-13 DIAGNOSIS — Z8701 Personal history of pneumonia (recurrent): Secondary | ICD-10-CM | POA: Diagnosis not present

## 2015-11-13 DIAGNOSIS — M797 Fibromyalgia: Secondary | ICD-10-CM | POA: Insufficient documentation

## 2015-11-13 DIAGNOSIS — Z8619 Personal history of other infectious and parasitic diseases: Secondary | ICD-10-CM | POA: Insufficient documentation

## 2015-11-13 DIAGNOSIS — Z862 Personal history of diseases of the blood and blood-forming organs and certain disorders involving the immune mechanism: Secondary | ICD-10-CM | POA: Insufficient documentation

## 2015-11-13 DIAGNOSIS — J449 Chronic obstructive pulmonary disease, unspecified: Secondary | ICD-10-CM | POA: Insufficient documentation

## 2015-11-13 DIAGNOSIS — K5790 Diverticulosis of intestine, part unspecified, without perforation or abscess without bleeding: Secondary | ICD-10-CM | POA: Diagnosis not present

## 2015-11-13 DIAGNOSIS — Z8679 Personal history of other diseases of the circulatory system: Secondary | ICD-10-CM | POA: Insufficient documentation

## 2015-11-13 DIAGNOSIS — Z880 Allergy status to penicillin: Secondary | ICD-10-CM | POA: Insufficient documentation

## 2015-11-13 DIAGNOSIS — E119 Type 2 diabetes mellitus without complications: Secondary | ICD-10-CM | POA: Diagnosis not present

## 2015-11-13 DIAGNOSIS — R109 Unspecified abdominal pain: Secondary | ICD-10-CM | POA: Diagnosis present

## 2015-11-13 DIAGNOSIS — Z7951 Long term (current) use of inhaled steroids: Secondary | ICD-10-CM | POA: Diagnosis not present

## 2015-11-13 DIAGNOSIS — M1990 Unspecified osteoarthritis, unspecified site: Secondary | ICD-10-CM | POA: Insufficient documentation

## 2015-11-13 DIAGNOSIS — Z79899 Other long term (current) drug therapy: Secondary | ICD-10-CM | POA: Insufficient documentation

## 2015-11-13 DIAGNOSIS — R197 Diarrhea, unspecified: Secondary | ICD-10-CM

## 2015-11-13 DIAGNOSIS — R1084 Generalized abdominal pain: Secondary | ICD-10-CM

## 2015-11-13 LAB — COMPREHENSIVE METABOLIC PANEL
ALT: 44 U/L (ref 14–54)
AST: 43 U/L — ABNORMAL HIGH (ref 15–41)
Albumin: 4.1 g/dL (ref 3.5–5.0)
Alkaline Phosphatase: 89 U/L (ref 38–126)
Anion gap: 8 (ref 5–15)
BUN: 7 mg/dL (ref 6–20)
CHLORIDE: 107 mmol/L (ref 101–111)
CO2: 24 mmol/L (ref 22–32)
CREATININE: 0.69 mg/dL (ref 0.44–1.00)
Calcium: 9.3 mg/dL (ref 8.9–10.3)
Glucose, Bld: 94 mg/dL (ref 65–99)
POTASSIUM: 3.8 mmol/L (ref 3.5–5.1)
SODIUM: 139 mmol/L (ref 135–145)
Total Bilirubin: 0.4 mg/dL (ref 0.3–1.2)
Total Protein: 7 g/dL (ref 6.5–8.1)

## 2015-11-13 LAB — CBC WITH DIFFERENTIAL/PLATELET
BASOS ABS: 0 10*3/uL (ref 0.0–0.1)
Basophils Relative: 0 %
Eosinophils Absolute: 0.1 10*3/uL (ref 0.0–0.7)
Eosinophils Relative: 2 %
HEMATOCRIT: 35.7 % — AB (ref 36.0–46.0)
Hemoglobin: 12 g/dL (ref 12.0–15.0)
LYMPHS PCT: 36 %
Lymphs Abs: 1.9 10*3/uL (ref 0.7–4.0)
MCH: 31.2 pg (ref 26.0–34.0)
MCHC: 33.6 g/dL (ref 30.0–36.0)
MCV: 92.7 fL (ref 78.0–100.0)
MONO ABS: 0.6 10*3/uL (ref 0.1–1.0)
Monocytes Relative: 12 %
NEUTROS ABS: 2.7 10*3/uL (ref 1.7–7.7)
NEUTROS PCT: 50 %
Platelets: 195 10*3/uL (ref 150–400)
RBC: 3.85 MIL/uL — ABNORMAL LOW (ref 3.87–5.11)
RDW: 11.9 % (ref 11.5–15.5)
WBC: 5.3 10*3/uL (ref 4.0–10.5)

## 2015-11-13 LAB — URINALYSIS, ROUTINE W REFLEX MICROSCOPIC
Bilirubin Urine: NEGATIVE
GLUCOSE, UA: NEGATIVE mg/dL
HGB URINE DIPSTICK: NEGATIVE
Ketones, ur: NEGATIVE mg/dL
Nitrite: NEGATIVE
PROTEIN: NEGATIVE mg/dL
SPECIFIC GRAVITY, URINE: 1.003 — AB (ref 1.005–1.030)
pH: 6.5 (ref 5.0–8.0)

## 2015-11-13 LAB — URINE MICROSCOPIC-ADD ON: RBC / HPF: NONE SEEN RBC/hpf (ref 0–5)

## 2015-11-13 LAB — LIPASE, BLOOD: LIPASE: 25 U/L (ref 11–51)

## 2015-11-13 MED ORDER — HYDROMORPHONE HCL 1 MG/ML IJ SOLN
1.0000 mg | Freq: Once | INTRAMUSCULAR | Status: AC
  Administered 2015-11-13: 1 mg via INTRAVENOUS
  Filled 2015-11-13: qty 1

## 2015-11-13 MED ORDER — ONDANSETRON HCL 4 MG/2ML IJ SOLN
4.0000 mg | Freq: Once | INTRAMUSCULAR | Status: DC
Start: 2015-11-13 — End: 2015-11-14
  Filled 2015-11-13: qty 2

## 2015-11-13 MED ORDER — IOHEXOL 300 MG/ML  SOLN
50.0000 mL | Freq: Once | INTRAMUSCULAR | Status: AC | PRN
  Administered 2015-11-13: 50 mL via ORAL

## 2015-11-13 MED ORDER — PROCHLORPERAZINE EDISYLATE 5 MG/ML IJ SOLN
10.0000 mg | Freq: Once | INTRAMUSCULAR | Status: AC
  Administered 2015-11-13: 10 mg via INTRAVENOUS
  Filled 2015-11-13: qty 2

## 2015-11-13 MED ORDER — IOHEXOL 300 MG/ML  SOLN
100.0000 mL | Freq: Once | INTRAMUSCULAR | Status: AC | PRN
  Administered 2015-11-13: 100 mL via INTRAVENOUS

## 2015-11-13 NOTE — ED Notes (Signed)
Abdominal pain. Recent admission for diverticulitis. She had antibiotics via PIC line. Diarrhea x 2 days. Abdominal bloating.

## 2015-11-13 NOTE — ED Notes (Signed)
Pt able to ambulate to restroom with no assistance, seemingly with no problem. 

## 2015-11-13 NOTE — ED Notes (Signed)
Family at bedside. 

## 2015-11-13 NOTE — ED Notes (Signed)
MD at bedside. 

## 2015-11-13 NOTE — ED Notes (Signed)
Pt states she was recently d/c'd from the hospital with a PICC line and treatment with antibiotics from [facility]. "Cannot take Vanc-gives me c-diff Saw Dr. Today, but left and came here. Had appt with surgeon on Fri and had PICC line removed.

## 2015-11-13 NOTE — ED Notes (Signed)
Pt placed on auto vitals Q30.  

## 2015-11-13 NOTE — ED Notes (Addendum)
Pt states she hasn't had a BM "besides just seeing a little on the toilet paper after going to the restroom."

## 2015-11-13 NOTE — ED Notes (Signed)
Pt able to ambulate to restroom with no assistance, seemingly with no problems. 

## 2015-11-13 NOTE — ED Provider Notes (Signed)
CSN: [id number]     Arrival date & time 11/13/15  1609 History  By signing my name below, I, Marisue Humble, attest that this documentation has been prepared under the direction and in the presence of Laurence Spates, MD . Electronically Signed: Marisue Humble, Scribe. 11/13/2015. 6:56 PM.   Chief Complaint  Patient presents with  . Abdominal Pain   The history is provided by the patient. No language interpreter was used.   HPI Comments:  Holly Rogers is a 55 y.o. female with PMHx of diverticulosis, pancreatitis, and DM who presents to the Emergency Department complaining of moderate-severe abdominal pain. Pt reports associated diarrhea for the past two days; she notes liquids "run right through" and states she has been wearing Depends. Pt notes she felt vomit come up last night, but she did not throw up. No alleviating or exacerbating factors noted. Pt was hospitalized ~1 month ago for diverticulitis and was discharged Oct 24, 2015. She was receiving Ertapenem through a picc line and was also taking Vancomcin for c diff; she took vanco for two days and stopped because it caused dysuria and blistering rash on her bottom. Pt states she finished Ertapenem Feb 18 and picc line was removed Feb 24. Pt saw her gastroenterologist last Friday c/o abdominal pain seeking colonoscopy which she did not have at that time; she saw her PCP today and they referred her to the ER. She has been taking Oxycodone BID for pain; she has also been taking nausea medication every day. Pt denies blood in stool, fever, or chills.  Past Medical History  Diagnosis Date  . Sarcoidosis (HCC)   . COPD (chronic obstructive pulmonary disease) (HCC)   . Arthritis   . Acid reflux   . Mitral valve prolapse   . Diverticulosis   . Pancreatitis 1997  . Sepsis (HCC)   . Pneumonia   . Sepsis (HCC)   . Diabetes mellitus without complication (HCC)   . Fibromyalgia   . COPD (chronic obstructive pulmonary disease) (HCC)   . DDD  (degenerative disc disease), lumbar   . Asthma    Past Surgical History  Procedure Laterality Date  . Cholecystectomy    . Abdominal hysterectomy     Family History  Problem Relation Age of Onset  . Asthma Mother   . COPD Mother     smoked  . Rheum arthritis Mother   . Skin cancer Mother    Social History  Substance Use Topics  . Smoking status: Never Smoker   . Smokeless tobacco: Never Used  . Alcohol Use: No   OB History    No data available     Review of Systems  Gastrointestinal: Positive for abdominal pain.   10 Systems reviewed and all are negative for acute change except as noted in the HPI.  Allergies  Amoxicillin; Avandia; Baclofen; Ceftin; Celebrex; Ciprofloxacin; Clindamycin/lincomycin; Doxycycline; Flagyl; Levaquin; Levofloxacin; Macrobid; Macrodantin; Metformin and related; Neurontin; Other; Sulfa antibiotics; Symbicort; Tramadol hcl; Ultram; Valdecoxib; Vancomycin; Vioxx; Voltaren; and Tape  Home Medications   Prior to Admission medications   Medication Sig Start Date End Date Taking? Authorizing Provider  albuterol (PROAIR HFA) 108 (90 BASE) MCG/ACT inhaler Inhale 2 puffs into the lungs every 6 (six) hours as needed for wheezing or shortness of breath.    Historical Provider, MD  alprazolam Prudy Feeler) 2 MG tablet Take 2 mg by mouth 2 (two) times daily as needed for sleep or anxiety.     Historical Provider, MD  b complex  vitamins capsule Take by mouth.    Historical Provider, MD  benzonatate (TESSALON PERLES) 100 MG capsule Take by mouth. 09/20/15 09/19/16  Historical Provider, MD  cetirizine (ZYRTEC) 10 MG tablet Take 10 mg by mouth daily.    Historical Provider, MD  Cyanocobalamin (B-12 PO) Take 1 tablet by mouth daily.    Historical Provider, MD  diclofenac sodium (VOLTAREN) 1 % GEL Apply 1 application topically 3 (three) times daily as needed (for pain).  01/18/14   Historical Provider, MD  dicyclomine (BENTYL) 10 MG capsule Take 10 mg by mouth 4 (four) times  daily -  before meals and at bedtime. Reported on 10/17/2015    Historical Provider, MD  fexofenadine (ALLEGRA) 180 MG tablet Take 180 mg by mouth daily.    Historical Provider, MD  fluticasone (FLONASE) 50 MCG/ACT nasal spray Place 2 sprays into both nostrils daily.    Historical Provider, MD  glucose blood (ONETOUCH VERIO) test strip USE 1 STRIP TO SKIN TWICE DAILY.  DX CODE: E11.9 03/28/15   Historical Provider, MD  HYDROcodone-acetaminophen (NORCO/VICODIN) 5-325 MG tablet Take 1 tablet by mouth 2 (two) times daily.    Historical Provider, MD  hydrocortisone 2.5 % cream Apply 1 application topically 2 (two) times daily. Reported on 10/17/2015    Historical Provider, MD  hydroxypropyl methylcellulose (ISOPTO TEARS) 2.5 % ophthalmic solution 1 drop. 12/14/14   Historical Provider, MD  ibuprofen (ADVIL,MOTRIN) 200 MG tablet Take 200 mg by mouth every 6 (six) hours as needed.    Historical Provider, MD  Lancet Device MISC by Does not apply route. 03/28/15 03/27/16  Historical Provider, MD  levalbuterol (XOPENEX) 1.25 MG/3ML nebulizer solution Take 3 mLs (1.25 mg total) by nebulization every 4 (four) hours as needed for Wheezing. 11/16/14 11/16/15  Historical Provider, MD  montelukast (SINGULAIR) 10 MG tablet Take 10 mg by mouth at bedtime. 12/02/13   Historical Provider, MD  polyethylene glycol powder (MIRALAX) powder Take 17 g by mouth daily. 12/25/14   April Palumbo, MD  potassium chloride SA (K-DUR,KLOR-CON) 20 MEQ tablet Take 20 mEq by mouth daily. 01/17/14   Historical Provider, MD  pregabalin (LYRICA) 50 MG capsule Take by mouth. 08/29/15 08/28/16  Historical Provider, MD  promethazine (PHENERGAN) 25 MG tablet Take 1 tablet (25 mg total) by mouth every 6 (six) hours as needed for nausea or vomiting. 03/06/15   Gwyneth Sprout, MD  senna (SENOKOT) 8.6 MG tablet Take by mouth.    Historical Provider, MD  tizanidine (ZANAFLEX) 2 MG capsule Take 2 mg by mouth 3 (three) times daily as needed for muscle spasms.     Historical Provider, MD  triamcinolone (NASACORT ALLERGY 24HR) 55 MCG/ACT AERO nasal inhaler Place 2 sprays into the nose daily as needed (for stuffy nose). Reported on 10/17/2015    Historical Provider, MD  triamcinolone cream (KENALOG) 0.1 % Apply 1 application topically daily as needed (for dry skin).  08/17/13   Historical Provider, MD  Vitamin D, Cholecalciferol, 400 units CAPS Take by mouth. Reported on 10/17/2015 07/09/07   Historical Provider, MD  Vitamin D, Ergocalciferol, (DRISDOL) 50000 units CAPS capsule TAKE 1 CAPSULE BY MOUTH EVERY WEEK 12/29/14   Historical Provider, MD   BP 103/70 mmHg  Pulse 72  Temp(Src) 97.8 F (36.6 C) (Oral)  Resp 18  SpO2 94% Physical Exam  Constitutional: She is oriented to person, place, and time. She appears well-developed and well-nourished. No distress.  HENT:  Head: Normocephalic and atraumatic.  Moist mucous membranes  Eyes:  Conjunctivae are normal. Pupils are equal, round, and reactive to light.  Neck: Neck supple.  Cardiovascular: Normal rate, regular rhythm and normal heart sounds.   No murmur heard. Pulmonary/Chest: Effort normal and breath sounds normal.  Abdominal: Soft. Bowel sounds are normal. There is no rebound and no guarding.  Generalized TTP with mild abdominal distention  Musculoskeletal: She exhibits no edema.  Neurological: She is alert and oriented to person, place, and time.  Fluent speech  Skin: Skin is warm and dry.  Psychiatric: She has a normal mood and affect. Judgment normal.  Nursing note and vitals reviewed.  ED Course  Procedures  DIAGNOSTIC STUDIES: Oxygen Saturation is 97% on RA, normal by my interpretation.    COORDINATION OF CARE: 6:39 PM Will order CT. Discussed treatment plan with pt at bedside and pt agreed to plan.  Labs Review Labs Reviewed  URINALYSIS, ROUTINE W REFLEX MICROSCOPIC (NOT AT [facility]) - Abnormal; Notable for the following:    Specific Gravity, Urine 1.003 (*)    Leukocytes, UA MODERATE  (*)    All other components within normal limits  URINE MICROSCOPIC-ADD ON - Abnormal; Notable for the following:    Squamous Epithelial / LPF 0-5 (*)    Bacteria, UA FEW (*)    All other components within normal limits  COMPREHENSIVE METABOLIC PANEL - Abnormal; Notable for the following:    AST 43 (*)    All other components within normal limits  CBC WITH DIFFERENTIAL/PLATELET - Abnormal; Notable for the following:    RBC 3.85 (*)    HCT 35.7 (*)    All other components within normal limits  C DIFFICILE QUICK SCREEN W PCR REFLEX  LIPASE, BLOOD    Imaging Review Ct Abdomen Pelvis W Contrast  11/13/2015  CLINICAL DATA:  Generalized abdominal pain, nausea, vomiting, diarrhea. Recent hospitalization for perforated diverticulitis. History of hysterectomy, cholecystectomy, sarcoidosis, pancreatitis and diabetes. EXAM: CT ABDOMEN AND PELVIS WITH CONTRAST TECHNIQUE: Multidetector CT imaging of the abdomen and pelvis was performed using the standard protocol following bolus administration of intravenous contrast. CONTRAST:  OMNIPAQUE IOHEXOL 300 MG/ML  SOLN COMPARISON:  CT abdomen and pelvis October 18, 2015 and December 22, 2014 FINDINGS: LUNG BASES: Minimal dependent atelectasis, lingular atelectasis/ scar. Heart size is normal, no pericardial effusions. SOLID ORGANS: The liver is diffusely hypodense compatible with hepatic steatosis, otherwise normal. Status postcholecystectomy. Gas distended Common bile duct and minimal intrahepatic pneumobilia compatible with prior surgery. Spleen, pancreas and adrenal glands are unremarkable. GASTROINTESTINAL TRACT: Moderate hiatal hernia. Severe sigmoid diverticulosis, resolution of park colonic small abscess, and inflammatory changes which were present on prior CT. Mild residual focal sigmoid wall thickening. Additional scattered colonic diverticula. Moderate amount of retained large bowel stool. Scattered small bowel air-fluid levels. The stomach, small bowel  are normal in course and caliber without inflammatory changes. Normal appendix. KIDNEYS/ URINARY TRACT: Kidneys are orthotopic, demonstrating symmetric enhancement. No nephrolithiasis, hydronephrosis or solid renal masses. RIGHT extra renal pelvis. The unopacified ureters are normal in course and caliber. Delayed imaging through the kidneys demonstrates symmetric prompt contrast excretion within the proximal urinary collecting system. Urinary bladder is partially distended and unremarkable. PERITONEUM/RETROPERITONEUM: Aortoiliac vessels are normal in course and caliber. No lymphadenopathy by CT size criteria. Status post hysterectomy. No intraperitoneal free fluid nor free air. Calcified granuloma LEFT lower abdomen granuloma with adjacent 12 mm nodule/lymph node stable from December 22, 2014. SOFT TISSUE/OSSEOUS STRUCTURES: Non-suspicious. Small fat containing umbilical hernia. IMPRESSION: Severe sigmoid diverticulosis with near resolution of previous  diverticulitis, no residual abscess. Moderate amount of retained large bowel stool without bowel obstruction. Scattered small bowel air-fluid levels be seen with stasis or mild enteritis. Electronically Signed   By: Awilda Metro M.D.   On: 11/13/2015 23:15   I have personally reviewed and evaluated these lab results as part of my medical decision-making.   EKG Interpretation None     Medications  ondansetron (ZOFRAN) injection 4 mg (4 mg Intravenous Not Given 11/13/15 1929)  HYDROmorphone (DILAUDID) injection 1 mg (1 mg Intravenous Given 11/13/15 1810)  prochlorperazine (COMPAZINE) injection 10 mg (10 mg Intravenous Given 11/13/15 1924)  iohexol (OMNIPAQUE) 300 MG/ML solution 50 mL (50 mLs Oral Contrast Given 11/13/15 1946)  iohexol (OMNIPAQUE) 300 MG/ML solution 100 mL (100 mLs Intravenous Contrast Given 11/13/15 2244)  oxyCODONE-acetaminophen (PERCOCET/ROXICET) 5-325 MG per tablet 1 tablet (1 tablet Oral Given 11/14/15 0024)    MDM   Final diagnoses:   Generalized abdominal pain  Diverticulosis of intestine without bleeding, unspecified intestinal tract location  Diarrhea, unspecified type   Patient with complex history including recent hospitalization for complicated diverticulitis and C. difficile infection presents with ongoing abdominal pain and diarrhea. On exam, she was chronically ill-appearing but in no acute distress. She had generalized tenderness to palpation. Vital signs unremarkable. Gave the patient Dilaudid, Zofran, and later Compazine. Lab work showed normal WBC count, unremarkable CMP, UA without signs of dehydration or infection. Because of patient's abdominal tenderness and complaints of abdominal distention, obtained CT to rule out perforation or bowel obstruction. CT showed severe diverticulosis with resolution of previous diverticulitis. No evidence of obstruction. Patient was noted to have several bowel movements while in the ED which were formed stool according to nursing staff. C. difficile is pending. On reexamination, the patient is comfortable. I have discussed importance of follow-up with gastroenterologist and general surgeon. Patient voiced understanding of plan and was discharged in satisfactory condition.  I personally performed the services described in this documentation, which was scribed in my presence. The recorded information has been reviewed and is accurate.    Laurence Spates, MD 11/14/15 [phone]

## 2015-11-13 NOTE — ED Notes (Signed)
Patient asked to change into a gown, patient demands to keep pants on.

## 2015-11-14 DIAGNOSIS — K5790 Diverticulosis of intestine, part unspecified, without perforation or abscess without bleeding: Secondary | ICD-10-CM | POA: Diagnosis not present

## 2015-11-14 MED ORDER — OXYCODONE-ACETAMINOPHEN 5-325 MG PO TABS
1.0000 | ORAL_TABLET | Freq: Once | ORAL | Status: AC
  Administered 2015-11-14: 1 via ORAL
  Filled 2015-11-14: qty 1

## 2015-11-14 NOTE — Discharge Instructions (Signed)
You can contact the Medical Records department at [facility] at [phone] You can fax the completed form to [phone]   Abdominal Pain, Adult Many things can cause abdominal pain. Usually, abdominal pain is not caused by a disease and will improve without treatment. It can often be observed and treated at home. Your health care provider will do a physical exam and possibly order blood tests and X-rays to help determine the seriousness of your pain. However, in many cases, more time must pass before a clear cause of the pain can be found. Before that point, your health care provider may not know if you need more testing or further treatment. HOME CARE INSTRUCTIONS Monitor your abdominal pain for any changes. The following actions may help to alleviate any discomfort you are experiencing:  Only take over-the-counter or prescription medicines as directed by your health care provider.  Do not take laxatives unless directed to do so by your health care provider.  Try a clear liquid diet (broth, tea, or water) as directed by your health care provider. Slowly move to a bland diet as tolerated. SEEK MEDICAL CARE IF:  You have unexplained abdominal pain.  You have abdominal pain associated with nausea or diarrhea.  You have pain when you urinate or have a bowel movement.  You experience abdominal pain that wakes you in the night.  You have abdominal pain that is worsened or improved by eating food.  You have abdominal pain that is worsened with eating fatty foods.  You have a fever. SEEK IMMEDIATE MEDICAL CARE IF:  Your pain does not go away within 2 hours.  You keep throwing up (vomiting).  Your pain is felt only in portions of the abdomen, such as the right side or the left lower portion of the abdomen.  You pass bloody or black tarry stools. MAKE SURE YOU:  Understand these instructions.  Will watch your condition.  Will get help right away if you are not doing well or get  worse.   This information is not intended to replace advice given to you by your health care provider. Make sure you discuss any questions you have with your health care provider.   Document Released: 06/11/2005 Document Revised: 05/23/2015 Document Reviewed: 05/11/2013 Else[employer] Interactive Patient Education [employer]

## 2016-01-29 ENCOUNTER — Emergency Department (HOSPITAL_BASED_OUTPATIENT_CLINIC_OR_DEPARTMENT_OTHER)
Admission: EM | Admit: 2016-01-29 | Discharge: 2016-01-30 | Disposition: A | Discharge status: Home / Self Care | Payer: Medicare Other | Attending: Emergency Medicine | Admitting: Emergency Medicine

## 2016-01-29 ENCOUNTER — Encounter (HOSPITAL_BASED_OUTPATIENT_CLINIC_OR_DEPARTMENT_OTHER): Payer: Self-pay | Admitting: *Deleted

## 2016-01-29 DIAGNOSIS — R072 Precordial pain: Secondary | ICD-10-CM | POA: Diagnosis present

## 2016-01-29 DIAGNOSIS — J45909 Unspecified asthma, uncomplicated: Secondary | ICD-10-CM | POA: Insufficient documentation

## 2016-01-29 DIAGNOSIS — Z79899 Other long term (current) drug therapy: Secondary | ICD-10-CM | POA: Diagnosis not present

## 2016-01-29 DIAGNOSIS — J449 Chronic obstructive pulmonary disease, unspecified: Secondary | ICD-10-CM | POA: Diagnosis not present

## 2016-01-29 DIAGNOSIS — R0789 Other chest pain: Secondary | ICD-10-CM | POA: Diagnosis not present

## 2016-01-29 DIAGNOSIS — F419 Anxiety disorder, unspecified: Secondary | ICD-10-CM | POA: Insufficient documentation

## 2016-01-29 DIAGNOSIS — E119 Type 2 diabetes mellitus without complications: Secondary | ICD-10-CM | POA: Diagnosis not present

## 2016-01-29 DIAGNOSIS — R079 Chest pain, unspecified: Secondary | ICD-10-CM

## 2016-01-29 NOTE — ED Provider Notes (Signed)
CSN: [id number]     Arrival date & time 01/29/16  2329 History  By signing my name below, I, Freida BusmanDiana Omoyeni, attest that this documentation has been prepared under the direction and in the presence of Rami Budhu, MD . Electronically Signed: Freida Busmaniana Omoyeni, Scribe. 01/30/2016. 12:01 AM.     Chief Complaint  Patient presents with  . Chest Pain   Patient is a 55 y.o. female presenting with chest pain. The history is provided by the patient. No language interpreter was used.  Chest Pain Pain location:  Substernal area Pain quality: not aching   Pain radiates to:  Does not radiate Pain radiates to the back: no   Pain severity:  Moderate Duration:  1 week Timing:  Constant Progression:  Waxing and waning Chronicity:  Recurrent Context: stress   Context: not breathing   Relieved by:  Nothing Worsened by:  Nothing tried Ineffective treatments:  None tried Associated symptoms: palpitations   Associated symptoms: no abdominal pain, no cough, no dizziness, no fever, no headache, no shortness of breath, no syncope and no weakness   Risk factors: no aortic disease and no smoking      HPI Comments:  Holly Rogers is a 55 y.o. female with a history of anxiety, who presents to the Emergency Department complaining of squeezing, non-radiating, central CP which began ~ 2300 this evening while at rest. She reports associated palpitations. Pt notes recent increase in stress due to familial issues. She denies SOB. No alleviating factors noted.  Past Medical History  Diagnosis Date  . Sarcoidosis (HCC)   . COPD (chronic obstructive pulmonary disease) (HCC)   . Arthritis   . Acid reflux   . Mitral valve prolapse   . Diverticulosis   . Pancreatitis 1997  . Sepsis (HCC)   . Pneumonia   . Sepsis (HCC)   . Diabetes mellitus without complication (HCC)   . Fibromyalgia   . COPD (chronic obstructive pulmonary disease) (HCC)   . DDD (degenerative disc disease), lumbar   . Asthma    Past Surgical  History  Procedure Laterality Date  . Cholecystectomy    . Abdominal hysterectomy     Family History  Problem Relation Age of Onset  . Asthma Mother   . COPD Mother     smoked  . Rheum arthritis Mother   . Skin cancer Mother    Social History  Substance Use Topics  . Smoking status: Never Smoker   . Smokeless tobacco: Never Used  . Alcohol Use: No   OB History    No data available     Review of Systems  Constitutional: Negative for fever.  Respiratory: Negative for cough and shortness of breath.   Cardiovascular: Positive for chest pain and palpitations. Negative for leg swelling and syncope.  Gastrointestinal: Negative for abdominal pain.  Neurological: Negative for dizziness, weakness and headaches.  All other systems reviewed and are negative.  Allergies  Amoxicillin; Avandia; Baclofen; Ceftin; Celebrex; Ciprofloxacin; Clindamycin/lincomycin; Doxycycline; Flagyl; Levaquin; Levofloxacin; Macrobid; Macrodantin; Metformin and related; Neurontin; Other; Sulfa antibiotics; Symbicort; Tramadol hcl; Ultram; Valdecoxib; Vancomycin; Vioxx; Voltaren; and Tape  Home Medications   Prior to Admission medications   Medication Sig Start Date End Date Taking? Authorizing Provider  oxyCODONE-acetaminophen (PERCOCET/ROXICET) 5-325 MG tablet Take by mouth every 4 (four) hours as needed for severe pain.   Yes Historical Provider, MD  albuterol (PROAIR HFA) 108 (90 BASE) MCG/ACT inhaler Inhale 2 puffs into the lungs every 6 (six) hours as needed for wheezing  or shortness of breath.    Historical Provider, MD  alprazolam Prudy Feeler) 2 MG tablet Take 2 mg by mouth 2 (two) times daily as needed for sleep or anxiety.     Historical Provider, MD  b complex vitamins capsule Take by mouth.    Historical Provider, MD  benzonatate (TESSALON PERLES) 100 MG capsule Take by mouth. 09/20/15 09/19/16  Historical Provider, MD  cetirizine (ZYRTEC) 10 MG tablet Take 10 mg by mouth daily.    Historical Provider, MD   Cyanocobalamin (B-12 PO) Take 1 tablet by mouth daily.    Historical Provider, MD  diclofenac sodium (VOLTAREN) 1 % GEL Apply 1 application topically 3 (three) times daily as needed (for pain).  01/18/14   Historical Provider, MD  dicyclomine (BENTYL) 10 MG capsule Take 10 mg by mouth 4 (four) times daily -  before meals and at bedtime. Reported on 10/17/2015    Historical Provider, MD  fexofenadine (ALLEGRA) 180 MG tablet Take 180 mg by mouth daily.    Historical Provider, MD  fluticasone (FLONASE) 50 MCG/ACT nasal spray Place 2 sprays into both nostrils daily.    Historical Provider, MD  glucose blood (ONETOUCH VERIO) test strip USE 1 STRIP TO SKIN TWICE DAILY.  DX CODE: E11.9 03/28/15   Historical Provider, MD  HYDROcodone-acetaminophen (NORCO/VICODIN) 5-325 MG tablet Take 1 tablet by mouth 2 (two) times daily.    Historical Provider, MD  hydrocortisone 2.5 % cream Apply 1 application topically 2 (two) times daily. Reported on 10/17/2015    Historical Provider, MD  hydroxypropyl methylcellulose (ISOPTO TEARS) 2.5 % ophthalmic solution 1 drop. 12/14/14   Historical Provider, MD  ibuprofen (ADVIL,MOTRIN) 200 MG tablet Take 200 mg by mouth every 6 (six) hours as needed.    Historical Provider, MD  Lancet Device MISC by Does not apply route. 03/28/15 03/27/16  Historical Provider, MD  montelukast (SINGULAIR) 10 MG tablet Take 10 mg by mouth at bedtime. 12/02/13   Historical Provider, MD  potassium chloride SA (K-DUR,KLOR-CON) 20 MEQ tablet Take 20 mEq by mouth daily. 01/17/14   Historical Provider, MD  pregabalin (LYRICA) 50 MG capsule Take by mouth. 08/29/15 08/28/16  Historical Provider, MD  promethazine (PHENERGAN) 25 MG tablet Take 1 tablet (25 mg total) by mouth every 6 (six) hours as needed for nausea or vomiting. 03/06/15   Gwyneth Sprout, MD  senna (SENOKOT) 8.6 MG tablet Take by mouth.    Historical Provider, MD  tizanidine (ZANAFLEX) 2 MG capsule Take 2 mg by mouth 3 (three) times daily as needed for  muscle spasms.    Historical Provider, MD  triamcinolone (NASACORT ALLERGY 24HR) 55 MCG/ACT AERO nasal inhaler Place 2 sprays into the nose daily as needed (for stuffy nose). Reported on 10/17/2015    Historical Provider, MD  triamcinolone cream (KENALOG) 0.1 % Apply 1 application topically daily as needed (for dry skin).  08/17/13   Historical Provider, MD  Vitamin D, Cholecalciferol, 400 units CAPS Take by mouth. Reported on 10/17/2015 07/09/07   Historical Provider, MD  Vitamin D, Ergocalciferol, (DRISDOL) 50000 units CAPS capsule TAKE 1 CAPSULE BY MOUTH EVERY WEEK 12/29/14   Historical Provider, MD   BP 139/92 mmHg  Pulse 83  Temp(Src) 97.7 F (36.5 C) (Oral)  Resp 18  Ht  (1.651 m)  Wt 173 lb (78.472 kg)  BMI 28.79 kg/m2  SpO2 98% Physical Exam  Constitutional: She appears well-developed and well-nourished. No distress.  HENT:  Head: Normocephalic and atraumatic.  Mouth/Throat: Oropharynx is  clear and moist. No oropharyngeal exudate.  Moist mucous membranes   Eyes: Conjunctivae are normal. Pupils are equal, round, and reactive to light.  Neck: Normal range of motion. Neck supple. No JVD present.  Trachea midline  Cardiovascular: Normal rate, regular rhythm, normal heart sounds and intact distal pulses.   Pulmonary/Chest: Effort normal and breath sounds normal. No stridor. No respiratory distress. She has no wheezes. She has no rales. She exhibits no tenderness.  Abdominal: Soft. She exhibits no distension. There is no tenderness. There is no rebound and no guarding.  Gassy in epigastrium Hyperactive bowel sounds   Musculoskeletal: Normal range of motion. She exhibits no edema or tenderness.  Neurological: She is alert. She has normal reflexes.  Skin: Skin is warm and dry.  Psychiatric: Her speech is tangential.  Tangential and circumstantial  appears intoxicated   Nursing note and vitals reviewed.   ED Course  Procedures  DIAGNOSTIC STUDIES:   Oxygen Saturation is 98 %  on normal by my interpretation.    COORDINATION OF CARE:  11:53 PM Discussed treatment plan with pt at bedside and pt agreed to plan.  Labs Review Labs Reviewed  CBC WITH DIFFERENTIAL/PLATELET  BASIC METABOLIC PANEL  TROPONIN I  D-DIMER, QUANTITATIVE (NOT AT [facility])    Imaging Review No results found. I have personally reviewed and evaluated these images and lab results as part of my medical decision-making.   EKG Interpretation None      MDM   Final diagnoses:  None     EKG Interpretation  Date/Time:  Tuesday Jan 29 2016 23:40:35 EDT Ventricular Rate:  87 PR Interval:  174 QRS Duration: 76 QT Interval:  358 QTC Calculation: 430 R Axis:   75 Text Interpretation:  Normal sinus rhythm Confirmed by [facility]  MD, Teri Legacy ([id number]) on 01/30/2016 12:06:57 AM      Filed Vitals:   01/29/16 2337  BP: 139/92  Pulse: 83  Temp: 97.7 F (36.5 C)  Resp: 18   Results for orders placed or performed during the hospital encounter of 01/29/16  CBC with Differential/Platelet  Result Value Ref Range   WBC 5.8 4.0 - 10.5 K/uL   RBC 4.06 3.87 - 5.11 MIL/uL   Hemoglobin 12.7 12.0 - 15.0 g/dL   HCT 60.4 54.0 - 98.1 %   MCV 91.1 78.0 - 100.0 fL   MCH 31.3 26.0 - 34.0 pg   MCHC 34.3 30.0 - 36.0 g/dL   RDW 19.1 47.8 - 29.5 %   Platelets 236 150 - 400 K/uL   Neutrophils Relative % 37 %   Neutro Abs 2.1 1.7 - 7.7 K/uL   Lymphocytes Relative 50 %   Lymphs Abs 2.9 0.7 - 4.0 K/uL   Monocytes Relative 11 %   Monocytes Absolute 0.6 0.1 - 1.0 K/uL   Eosinophils Relative 2 %   Eosinophils Absolute 0.1 0.0 - 0.7 K/uL   Basophils Relative 0 %   Basophils Absolute 0.0 0.0 - 0.1 K/uL  Basic metabolic panel  Result Value Ref Range   Sodium 140 135 - 145 mmol/L   Potassium 3.5 3.5 - 5.1 mmol/L   Chloride 108 101 - 111 mmol/L   CO2 24 22 - 32 mmol/L   Glucose, Bld 109 (H) 65 - 99 mg/dL   BUN 13 6 - 20 mg/dL   Creatinine, Ser 6.21 0.44 - 1.00 mg/dL   Calcium 9.4 8.9 - 30.8 mg/dL    GFR calc non Af Amer >60 >60 mL/min   GFR  calc Af Amer >60 >60 mL/min   Anion gap 8 5 - 15  Troponin I  Result Value Ref Range   Troponin I <0.03 <0.031 ng/mL  D-dimer, quantitative (not at [facility])  Result Value Ref Range   D-Dimer, Quant 0.42 0.00 - 0.50 ug/mL-FEU  Troponin I  Result Value Ref Range   Troponin I <0.03 <0.031 ng/mL   No results found.  Medications  gi cocktail (Maalox,Lidocaine,Donnatal) (30 mLs Oral Given 01/30/16 0014)    Patient ruled out for MI and PE in the ED.  Suspect anxiety with being taken off her narcotics and several family members OD on heroin.  Stable for discharge.  Heart score is 1.   Follow up with your PMD strict return precautions given  I personally performed the services described in this documentation, which was scribed in my presence. The recorded information has been reviewed and is accurate.      Cy Blamer, MD 01/30/16 [id number]

## 2016-01-29 NOTE — ED Notes (Addendum)
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

## 2016-01-30 ENCOUNTER — Ambulatory Visit (HOSPITAL_BASED_OUTPATIENT_CLINIC_OR_DEPARTMENT_OTHER)
Admission: RE | Admit: 2016-01-30 | Discharge: 2016-01-30 | Disposition: A | Discharge status: Home / Self Care | Payer: Medicare Other | Source: Ambulatory Visit | Attending: Emergency Medicine | Admitting: Emergency Medicine

## 2016-01-30 ENCOUNTER — Ambulatory Visit (INDEPENDENT_AMBULATORY_CARE_PROVIDER_SITE_OTHER): Payer: Medicare Other | Admitting: Internal Medicine

## 2016-01-30 ENCOUNTER — Encounter (HOSPITAL_BASED_OUTPATIENT_CLINIC_OR_DEPARTMENT_OTHER): Payer: Self-pay | Admitting: Emergency Medicine

## 2016-01-30 ENCOUNTER — Encounter: Payer: Self-pay | Admitting: Internal Medicine

## 2016-01-30 ENCOUNTER — Emergency Department (HOSPITAL_BASED_OUTPATIENT_CLINIC_OR_DEPARTMENT_OTHER): Payer: Medicare Other

## 2016-01-30 VITALS — BP 108/80 | HR 87 | Temp 98.1°F | Resp 16 | Ht 64.17 in | Wt 169.8 lb

## 2016-01-30 DIAGNOSIS — J3089 Other allergic rhinitis: Secondary | ICD-10-CM | POA: Diagnosis not present

## 2016-01-30 DIAGNOSIS — J4530 Mild persistent asthma, uncomplicated: Secondary | ICD-10-CM | POA: Diagnosis not present

## 2016-01-30 DIAGNOSIS — R0789 Other chest pain: Secondary | ICD-10-CM | POA: Diagnosis not present

## 2016-01-30 LAB — CBC WITH DIFFERENTIAL/PLATELET
BASOS PCT: 0 %
Basophils Absolute: 0 10*3/uL (ref 0.0–0.1)
EOS ABS: 0.1 10*3/uL (ref 0.0–0.7)
Eosinophils Relative: 2 %
HEMATOCRIT: 37 % (ref 36.0–46.0)
HEMOGLOBIN: 12.7 g/dL (ref 12.0–15.0)
LYMPHS ABS: 2.9 10*3/uL (ref 0.7–4.0)
Lymphocytes Relative: 50 %
MCH: 31.3 pg (ref 26.0–34.0)
MCHC: 34.3 g/dL (ref 30.0–36.0)
MCV: 91.1 fL (ref 78.0–100.0)
MONO ABS: 0.6 10*3/uL (ref 0.1–1.0)
MONOS PCT: 11 %
NEUTROS ABS: 2.1 10*3/uL (ref 1.7–7.7)
NEUTROS PCT: 37 %
Platelets: 236 10*3/uL (ref 150–400)
RBC: 4.06 MIL/uL (ref 3.87–5.11)
RDW: 12.1 % (ref 11.5–15.5)
WBC: 5.8 10*3/uL (ref 4.0–10.5)

## 2016-01-30 LAB — BASIC METABOLIC PANEL
Anion gap: 8 (ref 5–15)
BUN: 13 mg/dL (ref 6–20)
CALCIUM: 9.4 mg/dL (ref 8.9–10.3)
CHLORIDE: 108 mmol/L (ref 101–111)
CO2: 24 mmol/L (ref 22–32)
CREATININE: 0.71 mg/dL (ref 0.44–1.00)
GFR calc non Af Amer: >60 mL/min (ref >60)
Glucose, Bld: 109 mg/dL — ABNORMAL HIGH (ref 65–99)
Potassium: 3.5 mmol/L (ref 3.5–5.1)
Sodium: 140 mmol/L (ref 135–145)

## 2016-01-30 LAB — TROPONIN I
Troponin I: <0.03 ng/mL (ref <0.031)
Troponin I: <0.03 ng/mL (ref <0.031)

## 2016-01-30 LAB — D-DIMER, QUANTITATIVE (NOT AT ARMC): D DIMER QUANT: 0.42 ug{FEU}/mL (ref 0.00–0.50)

## 2016-01-30 MED ORDER — SUCRALFATE 1 GM/10ML PO SUSP: 1.0000 g | Freq: Three times a day (TID) | ORAL | Status: DC

## 2016-01-30 MED ORDER — FLUTICASONE PROPIONATE 50 MCG/ACT NA SUSP: 2.0000 | Freq: Every day | NASAL | Status: DC

## 2016-01-30 MED ORDER — GI COCKTAIL ~~LOC~~
30.0000 mL | Freq: Once | ORAL | Status: AC
  Administered 2016-01-30: 30 mL via ORAL
  Filled 2016-01-30: qty 30

## 2016-01-30 MED ORDER — LEVALBUTEROL TARTRATE 45 MCG/ACT IN AERO: 2.0000 | INHALATION_SPRAY | RESPIRATORY_TRACT | Status: DC | PRN

## 2016-01-30 MED ORDER — LEVALBUTEROL HCL 1.25 MG/3ML IN NEBU: 1.2500 mg | INHALATION_SOLUTION | RESPIRATORY_TRACT | Status: DC | PRN

## 2016-01-30 NOTE — Progress Notes (Signed)
History of Present Illness: Holly Rogers is a 55 y.o. female presenting for a post-emergency room follow-up  HPI Comments: Asthma/Sarcoidosis: Symptoms stable since her last visit without any severe exacerbations. 2 nights ago she had intermittent wheezing and coughing and last night she went to the emergency room for chest pain. Troponins and d-dimer were negative, CBC, CMP were within normal limits. Chest x-ray was normal. Patient believes that she may be having symptoms of increased anxiety due to her Xanax being decreased from 2 tablets daily down to 1 tablet daily. She has are to discuss this with her primary care physician.  Allergic rhinitis: She has been having increased sinus drainage and congestion over the past few days. She would like a refill on her fluticasone. She normally does well with cetirizine alternating with fexofenadine, Singulair.   Assessment and Plan: Mild persistent asthma  Currently well controlled  Continue Singulair (montelukast) 10 mg daily  Continue as needed Xopenex. Albuterol causes shakiness   Other allergic rhinitis  Continue cetirizine alternating with fexofenadine  Continue fluticasone nasal spray and lubricating eye drops     Return in about 1 year (around 01/29/2017).  Current Outpatient Prescriptions on File Prior to Visit  Medication Sig Dispense Refill  . alprazolam (XANAX) 2 MG tablet Take 2 mg by mouth daily.     Marland Kitchen. b complex vitamins capsule Take by mouth.    . benzonatate (TESSALON PERLES) 100 MG capsule Take 100 mg by mouth as needed.     . cetirizine (ZYRTEC) 10 MG tablet Take 10 mg by mouth daily.    . Cyanocobalamin (B-12 PO) Take 1 tablet by mouth daily.    . diclofenac sodium (VOLTAREN) 1 % GEL Apply 1 application topically 3 (three) times daily as needed (for pain).     Marland Kitchen. dicyclomine (BENTYL) 10 MG capsule Take 10 mg by mouth 4 (four) times daily -  before meals and at bedtime. Reported on 10/17/2015    . fexofenadine (ALLEGRA)  180 MG tablet Take 180 mg by mouth daily.    Marland Kitchen. glucose blood (ONETOUCH VERIO) test strip USE 1 STRIP TO SKIN TWICE DAILY.  DX CODE: E11.9    . hydroxypropyl methylcellulose (ISOPTO TEARS) 2.5 % ophthalmic solution 1 drop.    Marland Kitchen. ibuprofen (ADVIL,MOTRIN) 200 MG tablet Take 200 mg by mouth every 6 (six) hours as needed.    Demetra Shiner. Lancet Device MISC by Does not apply route.    . montelukast (SINGULAIR) 10 MG tablet Take 10 mg by mouth at bedtime.    Marland Kitchen. oxyCODONE-acetaminophen (PERCOCET/ROXICET) 5-325 MG tablet Take by mouth every 4 (four) hours as needed for severe pain.    . potassium chloride SA (K-DUR,KLOR-CON) 20 MEQ tablet Take 20 mEq by mouth daily.    . promethazine (PHENERGAN) 25 MG tablet Take 1 tablet (25 mg total) by mouth every 6 (six) hours as needed for nausea or vomiting. 30 tablet 0  . senna (SENOKOT) 8.6 MG tablet Take by mouth.    . sucralfate (CARAFATE) 1 GM/10ML suspension Take 10 mLs (1 g total) by mouth 4 (four) times daily -  with meals and at bedtime. 420 mL 0  . triamcinolone cream (KENALOG) 0.1 % Apply 1 application topically daily as needed (for dry skin).     . Vitamin D, Ergocalciferol, (DRISDOL) 50000 units CAPS capsule TAKE 1 CAPSULE BY MOUTH EVERY WEEK    . HYDROcodone-acetaminophen (NORCO/VICODIN) 5-325 MG tablet Take 1 tablet by mouth 2 (two) times daily. Reported on 01/30/2016    .  hydrocortisone 2.5 % cream Apply 1 application topically 2 (two) times daily. Reported on 01/30/2016    . pregabalin (LYRICA) 50 MG capsule Take by mouth. Reported on 01/30/2016    . tizanidine (ZANAFLEX) 2 MG capsule Take 2 mg by mouth 3 (three) times daily as needed for muscle spasms. Reported on 01/30/2016    . triamcinolone (NASACORT ALLERGY 24HR) 55 MCG/ACT AERO nasal inhaler Place 2 sprays into the nose daily as needed (for stuffy nose). Reported on 01/30/2016    . Vitamin D, Cholecalciferol, 400 units CAPS Take by mouth. Reported on 01/30/2016    . [DISCONTINUED] sodium chloride (OCEAN) 0.65 %  SOLN nasal spray Place 1 spray into both nostrils as needed for congestion. (Patient not taking: Reported on 06/15/2015) 60 mL 0   No current facility-administered medications on file prior to visit.    Meds ordered this encounter  Medications  . polyvinyl alcohol (LIQUIFILM TEARS) 1.4 % ophthalmic solution    Sig: Reported on 01/30/2016  . DISCONTD: levalbuterol (XOPENEX) 1.25 MG/3ML nebulizer solution    Sig:   . Multiple Vitamin (MULTI-VITAMINS) TABS    Sig: Take by mouth.  . DISCONTD: dicyclomine (BENTYL) 20 MG tablet    Sig:   . DISCONTD: oxyCODONE-acetaminophen (PERCOCET) 10-325 MG tablet    Sig: TK ONE T PO Q 4 H PRN P    Refill:  0  . fluticasone (FLONASE) 50 MCG/ACT nasal spray    Sig: Place 2 sprays into both nostrils daily.    Dispense:  16 g    Refill:  5  . levalbuterol (XOPENEX HFA) 45 MCG/ACT inhaler    Sig: Inhale 2 puffs into the lungs every 4 (four) hours as needed for wheezing.    Dispense:  1 Inhaler    Refill:  5  . levalbuterol (XOPENEX) 1.25 MG/3ML nebulizer solution    Sig: Take 1.25 mg by nebulization every 4 (four) hours as needed for wheezing.    Dispense:  72 mL    Refill:  3    Diagnostics: Spirometry: FEV1 2.79 L or 101 %, FEV1/FVC  83 %.  This is a normal study  Physical Exam: BP 108/80 mmHg  Pulse 87  Temp(Src) 98.1 F (36.7 C) (Oral)  Resp 16  Ht 5' 4.17" (1.63 m)  Wt 169 lb 12.1 oz (77 kg)  BMI 28.98 kg/m2   Physical Exam  Constitutional: She appears well-developed and well-nourished. No distress.  HENT:  Right Ear: External ear normal.  Left Ear: External ear normal.  Nose: Nose normal.  Mouth/Throat: Oropharynx is clear and moist.  Eyes: Conjunctivae are normal. Right eye exhibits no discharge. Left eye exhibits no discharge.  Cardiovascular: Normal rate, regular rhythm and normal heart sounds.   No murmur heard. Pulmonary/Chest: Effort normal and breath sounds normal. No respiratory distress. She has no wheezes. She has no  rales.  Abdominal: Soft. Bowel sounds are normal.  Musculoskeletal: She exhibits no edema.  Lymphadenopathy:    She has no cervical adenopathy.  Neurological: She is alert.  Skin: No rash noted.  Vitals reviewed.   Patient Active Problem List   Diagnosis Date Noted  . Diverticulitis of intestine with abscess 10/18/2015  . Chest wall pain, chronic 08/23/2015  . Mild persistent asthma 06/15/2015  . Other allergic rhinitis 06/15/2015  . Sepsis (HCC) 03/05/2014  . CAP (community acquired pneumonia) 03/05/2014  . Sarcoidosis (HCC) 03/05/2014    Drug Allergies:  Allergies  Allergen Reactions  . Amoxicillin Nausea Only  . Avandia [  Rosiglitazone]     Chest pain  . Baclofen Nausea Only    Unknown, patient knows she couldn't take it  . Cefdinir Other (See Comments)    No reaction listed. Ask pt and enter  . Ceftin [Cefuroxime Axetil]     unknown  . Celebrex [Celecoxib]     Chest pain  . Ciprofloxacin Nausea And Vomiting  . Clindamycin/Lincomycin     unknown  . Doxycycline     unknown  . Erythromycin Base Other (See Comments)    Ask pt and enter  . Flagyl [Metronidazole] Nausea And Vomiting  . Latex Other (See Comments)    Ask pt and enter  . Levaquin [Levofloxacin In D5w]     unknown  . Levofloxacin Other (See Comments)  . Macrobid [employer] Macro]     unknown  . Macrodantin [Nitrofurantoin Macrocrystal]   . Metformin And Related     Chest pain  . Neurontin [Gabapentin]     Gave patient problems  . Other Nausea And Vomiting    Darvocet  . Sulfa Antibiotics Nausea And Vomiting  . Symbicort [Budesonide-Formoterol Fumarate]     Shakes  . Tramadol Hcl   . Ultram [Tramadol]     unknown  . Valdecoxib Other (See Comments)    Unknown  . Vancomycin Nausea And Vomiting  . Vioxx [Rofecoxib]     unknown  . Voltaren [Diclofenac Sodium]     Unknown. But she can use the gel  . Tape Rash    Can use paper tape    ROS: Per HPI unless specifically indicated  below Review of Systems  Thank you for the opportunity to care for this patient.  Please do not hesitate to contact me with questions.

## 2016-01-30 NOTE — Assessment & Plan Note (Signed)
   Currently well controlled  Continue Singulair (montelukast) 10 mg daily  Continue as needed Xopenex. Albuterol causes shakiness

## 2016-01-30 NOTE — Assessment & Plan Note (Signed)
   Continue cetirizine alternating with fexofenadine  Continue fluticasone nasal spray and lubricating eye drops

## 2016-01-30 NOTE — Patient Instructions (Signed)
Mild persistent asthma  Currently well controlled  Continue Singulair (montelukast) 10 mg daily  Continue as needed Xopenex. Albuterol causes shakiness   Other allergic rhinitis  Continue cetirizine alternating with fexofenadine  Continue fluticasone nasal spray and lubricating eye drops

## 2016-02-08 ENCOUNTER — Telehealth: Payer: Self-pay

## 2016-02-08 NOTE — Telephone Encounter (Signed)
Pt states that the pharmacy says that xopenx will not be covered can we switch her, and can we see about ordering her a new neb.  Please advise

## 2016-02-13 NOTE — Telephone Encounter (Signed)
Please submit prior auth; albuterol causes shakiness

## 2016-02-15 NOTE — Telephone Encounter (Signed)
Left message for Zella BallRobin to call office and let us know about insurance so we can get approval for xopenex

## 2016-02-19 ENCOUNTER — Telehealth: Payer: Self-pay | Admitting: Allergy

## 2016-02-19 ENCOUNTER — Other Ambulatory Visit: Payer: Self-pay

## 2016-02-19 MED ORDER — LEVALBUTEROL HCL 1.25 MG/3ML IN NEBU: 1.2500 mg | INHALATION_SOLUTION | Freq: Four times a day (QID) | RESPIRATORY_TRACT | Status: DC | PRN

## 2016-02-19 NOTE — Telephone Encounter (Signed)
LEFT NUMBER OF MESSAGES FOR Holly Rogers AND HER BOY FRIEND TO CALL OFFICE ABOUT XOPENEX. NO CALLS BACK.

## 2016-02-28 NOTE — Telephone Encounter (Signed)
PATIENT HAS NOT RETURNED ANY OF OUR CALLS.

## 2016-03-06 DIAGNOSIS — K5730 Diverticulosis of large intestine without perforation or abscess without bleeding: Secondary | ICD-10-CM | POA: Insufficient documentation

## 2016-03-08 DIAGNOSIS — A498 Other bacterial infections of unspecified site: Secondary | ICD-10-CM | POA: Insufficient documentation

## 2016-03-10 NOTE — Telephone Encounter (Signed)
SENT LETTER FOR PT TO CALL OFFICE REGARDING MEDICATION.

## 2016-03-13 NOTE — Telephone Encounter (Signed)
PATIENT NEVER RETURNED OUR CALLS.NO CONTACT INFORMATION AVAILABLE.

## 2016-04-01 ENCOUNTER — Emergency Department (HOSPITAL_BASED_OUTPATIENT_CLINIC_OR_DEPARTMENT_OTHER): Payer: Medicare Other

## 2016-04-01 ENCOUNTER — Encounter (HOSPITAL_BASED_OUTPATIENT_CLINIC_OR_DEPARTMENT_OTHER): Payer: Self-pay | Admitting: Emergency Medicine

## 2016-04-01 ENCOUNTER — Emergency Department (HOSPITAL_BASED_OUTPATIENT_CLINIC_OR_DEPARTMENT_OTHER)
Admission: EM | Admit: 2016-04-01 | Discharge: 2016-04-02 | Disposition: A | Discharge status: Home / Self Care | Payer: Medicare Other | Attending: Emergency Medicine | Admitting: Emergency Medicine

## 2016-04-01 DIAGNOSIS — R110 Nausea: Secondary | ICD-10-CM | POA: Diagnosis not present

## 2016-04-01 DIAGNOSIS — J449 Chronic obstructive pulmonary disease, unspecified: Secondary | ICD-10-CM | POA: Diagnosis not present

## 2016-04-01 DIAGNOSIS — R140 Abdominal distension (gaseous): Secondary | ICD-10-CM | POA: Insufficient documentation

## 2016-04-01 DIAGNOSIS — E119 Type 2 diabetes mellitus without complications: Secondary | ICD-10-CM | POA: Insufficient documentation

## 2016-04-01 DIAGNOSIS — M1990 Unspecified osteoarthritis, unspecified site: Secondary | ICD-10-CM | POA: Insufficient documentation

## 2016-04-01 DIAGNOSIS — R197 Diarrhea, unspecified: Secondary | ICD-10-CM | POA: Diagnosis not present

## 2016-04-01 DIAGNOSIS — R1031 Right lower quadrant pain: Secondary | ICD-10-CM | POA: Insufficient documentation

## 2016-04-01 DIAGNOSIS — R109 Unspecified abdominal pain: Secondary | ICD-10-CM

## 2016-04-01 HISTORY — DX: Diverticulitis of intestine, part unspecified, without perforation or abscess without bleeding: K57.92

## 2016-04-01 LAB — CBC WITH DIFFERENTIAL/PLATELET
BASOS PCT: 0 %
Basophils Absolute: 0 10*3/uL (ref 0.0–0.1)
EOS ABS: 0.1 10*3/uL (ref 0.0–0.7)
EOS PCT: 1 %
HCT: 37.7 % (ref 36.0–46.0)
HEMOGLOBIN: 12.7 g/dL (ref 12.0–15.0)
Lymphocytes Relative: 48 %
Lymphs Abs: 2.7 10*3/uL (ref 0.7–4.0)
MCH: 31.2 pg (ref 26.0–34.0)
MCHC: 33.7 g/dL (ref 30.0–36.0)
MCV: 92.6 fL (ref 78.0–100.0)
Monocytes Absolute: 0.7 10*3/uL (ref 0.1–1.0)
Monocytes Relative: 13 %
NEUTROS PCT: 38 %
Neutro Abs: 2.2 10*3/uL (ref 1.7–7.7)
PLATELETS: 234 10*3/uL (ref 150–400)
RBC: 4.07 MIL/uL (ref 3.87–5.11)
RDW: 12.2 % (ref 11.5–15.5)
WBC: 5.6 10*3/uL (ref 4.0–10.5)

## 2016-04-01 LAB — URINALYSIS, ROUTINE W REFLEX MICROSCOPIC
BILIRUBIN URINE: NEGATIVE
Glucose, UA: NEGATIVE mg/dL
Ketones, ur: NEGATIVE mg/dL
Leukocytes, UA: NEGATIVE
NITRITE: NEGATIVE
PROTEIN: NEGATIVE mg/dL
SPECIFIC GRAVITY, URINE: 1.017 (ref 1.005–1.030)
pH: 5.5 (ref 5.0–8.0)

## 2016-04-01 LAB — URINE MICROSCOPIC-ADD ON

## 2016-04-01 MED ORDER — IOPAMIDOL (ISOVUE-300) INJECTION 61%
100.0000 mL | Freq: Once | INTRAVENOUS | Status: AC | PRN
  Administered 2016-04-02: 100 mL via INTRAVENOUS

## 2016-04-01 MED ORDER — HYDROMORPHONE HCL 1 MG/ML IJ SOLN
1.0000 mg | Freq: Once | INTRAMUSCULAR | Status: AC
  Administered 2016-04-02: 1 mg via INTRAVENOUS
  Filled 2016-04-01: qty 1

## 2016-04-01 MED ORDER — ONDANSETRON HCL 4 MG/2ML IJ SOLN
4.0000 mg | Freq: Once | INTRAMUSCULAR | Status: AC
  Administered 2016-04-02: 4 mg via INTRAVENOUS
  Filled 2016-04-01: qty 2

## 2016-04-01 MED ORDER — SODIUM CHLORIDE 0.9 % IV BOLUS (SEPSIS)
1000.0000 mL | Freq: Once | INTRAVENOUS | Status: AC
  Administered 2016-04-02: 1000 mL via INTRAVENOUS

## 2016-04-01 NOTE — ED Notes (Addendum)
Patient states that [facility]. Went home 2 days ago.  The patient reports that she continues to have pain but this is different than the Diverticulitis pain. The pateint states that she is unable to eat or drink anything and she cant swallow anything because of the soreness to her throat. The patient reports that the food hurts her throat so bad that "i about stop crying" Reports that she is having loose stools and that he "belly is blowing up" She states that it hurts so bad that she can not turn in the car. "I feel like my stomach is going to bust"  - "I have had sepsis in the past and I feel like that could be the problem again"

## 2016-04-01 NOTE — ED Provider Notes (Signed)
CSN: [id number]     Arrival date & time 04/01/16  2231 History  By signing my name below, I, Placido Sou, attest that this documentation has been prepared under the direction and in the presence of Seyon Strader, MD. Electronically Signed: Placido Sou, ED Scribe. 04/01/2016. 11:25 PM.    Chief Complaint  Patient presents with  . Abdominal Pain   Patient is a 55 y.o. female presenting with abdominal pain. The history is provided by the patient. No language interpreter was used.  Abdominal Pain Pain location:  Suprapubic, RUQ and RLQ Pain quality: cramping   Pain radiates to:  Does not radiate Pain severity:  Moderate Onset quality:  Gradual Duration:  3 days Timing:  Constant Progression:  Worsening Chronicity:  Chronic Relieved by:  Nothing Associated symptoms: diarrhea and nausea   Associated symptoms: no vomiting     HPI Comments: Jovanka Westgate is a 55 y.o. female with a PMHx of diverticulosis, pancreatitis and diverticulitis who presents to the Emergency Department complaining of worsening, moderate, cramping, suprapubic and right sided abd pain onset a few days ago. Pt states she was admitted to [facility] for 5 days due to a diverticulitis exacerbation and was released 2 days ago noting she has continued to experience her abd pain since being discharged. She reports associated nausea, diarrhea and abd swelling. She denies having began any new medications further noting she has not had her rx xanax for ~2 weeks and is out of her rx pain medications. Her husband states she experiences recurrent diverticulitis and confirms her symptoms are consistent with prior exacerbations. Pt has an appointment with her GI doctor at the end of the month.    Past Medical History  Diagnosis Date  . Sarcoidosis (HCC)   . COPD (chronic obstructive pulmonary disease) (HCC)   . Arthritis   . Acid reflux   . Mitral valve prolapse   . Diverticulosis   . Pancreatitis 1997  . Sepsis (HCC)   . Pneumonia    . Sepsis (HCC)   . Diabetes mellitus without complication (HCC)   . Fibromyalgia   . COPD (chronic obstructive pulmonary disease) (HCC)   . DDD (degenerative disc disease), lumbar   . Diverticulitis   . DDD (degenerative disc disease), lumbar    Past Surgical History  Procedure Laterality Date  . Cholecystectomy    . Abdominal hysterectomy     Family History  Problem Relation Age of Onset  . Asthma Mother   . COPD Mother     smoked  . Rheum arthritis Mother   . Skin cancer Mother    Social History  Substance Use Topics  . Smoking status: Never Smoker   . Smokeless tobacco: Never Used  . Alcohol Use: No   OB History    No data available     Review of Systems  Gastrointestinal: Positive for nausea, abdominal pain, diarrhea and abdominal distention. Negative for vomiting.  All other systems reviewed and are negative.  Allergies  Amoxicillin; Avandia; Avelox; Baclofen; Cefdinir; Ceftin; Celebrex; Ciprofloxacin; Clindamycin/lincomycin; Doxycycline; Erythromycin base; Flagyl; Latex; Levaquin; Levofloxacin; Macrobid; Macrodantin; Metformin and related; Neurontin; Other; Sulfa antibiotics; Symbicort; Tramadol hcl; Ultram; Valdecoxib; Vancomycin; Vioxx; Voltaren; and Tape  Home Medications   Prior to Admission medications   Medication Sig Start Date End Date Taking? Authorizing Provider  alprazolam Prudy Feeler) 2 MG tablet Take 2 mg by mouth daily.     Historical Provider, MD  b complex vitamins capsule Take by mouth.    Historical Provider, MD  benzonatate (TESSALON PERLES) 100 MG capsule Take 100 mg by mouth as needed.  09/20/15 09/19/16  Historical Provider, MD  cetirizine (ZYRTEC) 10 MG tablet Take 10 mg by mouth daily.    Historical Provider, MD  Cyanocobalamin (B-12 PO) Take 1 tablet by mouth daily.    Historical Provider, MD  diclofenac sodium (VOLTAREN) 1 % GEL Apply 1 application topically 3 (three) times daily as needed (for pain).  01/18/14   Historical Provider, MD   dicyclomine (BENTYL) 10 MG capsule Take 10 mg by mouth 4 (four) times daily -  before meals and at bedtime. Reported on 10/17/2015    Historical Provider, MD  fexofenadine (ALLEGRA) 180 MG tablet Take 180 mg by mouth daily.    Historical Provider, MD  fluticasone (FLONASE) 50 MCG/ACT nasal spray Place 2 sprays into both nostrils daily. 01/30/16   Mikki Santee, MD  glucose blood (ONETOUCH VERIO) test strip USE 1 STRIP TO SKIN TWICE DAILY.  DX CODE: E11.9 03/28/15   Historical Provider, MD  HYDROcodone-acetaminophen (NORCO/VICODIN) 5-325 MG tablet Take 1 tablet by mouth 2 (two) times daily. Reported on 01/30/2016    Historical Provider, MD  hydrocortisone 2.5 % cream Apply 1 application topically 2 (two) times daily. Reported on 01/30/2016    Historical Provider, MD  hydroxypropyl methylcellulose (ISOPTO TEARS) 2.5 % ophthalmic solution 1 drop. 12/14/14   Historical Provider, MD  ibuprofen (ADVIL,MOTRIN) 200 MG tablet Take 200 mg by mouth every 6 (six) hours as needed.    Historical Provider, MD  levalbuterol [facility] HFA) 45 MCG/ACT inhaler Inhale 2 puffs into the lungs every 4 (four) hours as needed for wheezing. 01/30/16   Mikki Santee, MD  levalbuterol (XOPENEX) 1.25 MG/3ML nebulizer solution Take 1.25 mg by nebulization every 6 (six) hours as needed for wheezing or shortness of breath. 02/19/16   Fletcher Anon, MD  montelukast (SINGULAIR) 10 MG tablet Take 10 mg by mouth at bedtime. 12/02/13   Historical Provider, MD  Multiple Vitamin (MULTI-VITAMINS) TABS Take by mouth.    Historical Provider, MD  oxyCODONE-acetaminophen (PERCOCET/ROXICET) 5-325 MG tablet Take by mouth every 4 (four) hours as needed for severe pain.    Historical Provider, MD  polyvinyl alcohol (LIQUIFILM TEARS) 1.4 % ophthalmic solution Reported on 01/30/2016    Historical Provider, MD  potassium chloride SA (K-DUR,KLOR-CON) 20 MEQ tablet Take 20 mEq by mouth daily. 01/17/14   Historical Provider, MD  pregabalin (LYRICA) 50 MG capsule Take  by mouth. Reported on 01/30/2016 08/29/15 08/28/16  Historical Provider, MD  promethazine (PHENERGAN) 25 MG tablet Take 1 tablet (25 mg total) by mouth every 6 (six) hours as needed for nausea or vomiting. 03/06/15   Gwyneth Sprout, MD  senna (SENOKOT) 8.6 MG tablet Take by mouth.    Historical Provider, MD  sucralfate (CARAFATE) 1 GM/10ML suspension Take 10 mLs (1 g total) by mouth 4 (four) times daily -  with meals and at bedtime. 01/30/16   Adalae Baysinger, MD  tizanidine (ZANAFLEX) 2 MG capsule Take 2 mg by mouth 3 (three) times daily as needed for muscle spasms. Reported on 01/30/2016    Historical Provider, MD  triamcinolone (NASACORT ALLERGY 24HR) 55 MCG/ACT AERO nasal inhaler Place 2 sprays into the nose daily as needed (for stuffy nose). Reported on 01/30/2016    Historical Provider, MD  triamcinolone cream (KENALOG) 0.1 % Apply 1 application topically daily as needed (for dry skin).  08/17/13   Historical Provider, MD  Vitamin D, Cholecalciferol, 400 units CAPS Take by  mouth. Reported on 01/30/2016 07/09/07   Historical Provider, MD  Vitamin D, Ergocalciferol, (DRISDOL) 50000 units CAPS capsule TAKE 1 CAPSULE BY MOUTH EVERY WEEK 12/29/14   Historical Provider, MD   BP 132/96 mmHg  Pulse 74  Temp(Src) 98.7 F (37.1 C) (Oral)  Resp 18  Ht 5\' 5"  (1.651 m)  Wt 170 lb (77.111 kg)  BMI 28.29 kg/m2  SpO2 100%    Physical Exam  Constitutional: She is oriented to person, place, and time. She appears well-developed and well-nourished.  HENT:  Head: Normocephalic and atraumatic.  Mouth/Throat: Oropharynx is clear and moist. No oropharyngeal exudate.  Trachea midline; no stridor; no bruits   Eyes: Conjunctivae and EOM are normal. Pupils are equal, round, and reactive to light.  Neck: Normal range of motion.  Cardiovascular: Normal rate, regular rhythm, normal heart sounds and intact distal pulses.  Exam reveals no gallop and no friction rub.   No murmur heard. Pulmonary/Chest: Effort normal and  breath sounds normal. No respiratory distress. She has no wheezes. She has no rales.  Abdominal: Soft. Bowel sounds are increased.  Hyperactive bowel sounds  Musculoskeletal: Normal range of motion.  Neurological: She is alert and oriented to person, place, and time. She has normal reflexes.  Skin: Skin is warm and dry.  Psychiatric: She has a normal mood and affect.  Nursing note and vitals reviewed.   ED Course  Procedures  DIAGNOSTIC STUDIES: Oxygen Saturation is 100% on RA, normal by my interpretation.    COORDINATION OF CARE: 11:09 PM Discussed next steps with pt. Pt verbalized understanding and is agreeable with the plan.   Labs Review Labs Reviewed - No data to display  Imaging Review No results found. I have personally reviewed and evaluated these images and lab results as part of my medical decision-making.   EKG Interpretation None      MDM   Final diagnoses:  None   Filed Vitals:   04/01/16 2245  BP: 132/96  Pulse: 74  Temp: 98.7 F (37.1 C)  Resp: 18   Results for orders placed or performed during the hospital encounter of 04/01/16  CBC with Differential/Platelet  Result Value Ref Range   WBC 5.6 4.0 - 10.5 K/uL   RBC 4.07 3.87 - 5.11 MIL/uL   Hemoglobin 12.7 12.0 - 15.0 g/dL   HCT 40.937.7 81.136.0 - 91.446.0 %   MCV 92.6 78.0 - 100.0 fL   MCH 31.2 26.0 - 34.0 pg   MCHC 33.7 30.0 - 36.0 g/dL   RDW 78.212.2 95.611.5 - 21.315.5 %   Platelets 234 150 - 400 K/uL   Neutrophils Relative % 38 %   Neutro Abs 2.2 1.7 - 7.7 K/uL   Lymphocytes Relative 48 %   Lymphs Abs 2.7 0.7 - 4.0 K/uL   Monocytes Relative 13 %   Monocytes Absolute 0.7 0.1 - 1.0 K/uL   Eosinophils Relative 1 %   Eosinophils Absolute 0.1 0.0 - 0.7 K/uL   Basophils Relative 0 %   Basophils Absolute 0.0 0.0 - 0.1 K/uL  Comprehensive metabolic panel  Result Value Ref Range   Sodium 139 135 - 145 mmol/L   Potassium 3.7 3.5 - 5.1 mmol/L   Chloride 107 101 - 111 mmol/L   CO2 25 22 - 32 mmol/L   Glucose,  Bld 101 (H) 65 - 99 mg/dL   BUN 9 6 - 20 mg/dL   Creatinine, Ser 0.860.57 0.44 - 1.00 mg/dL   Calcium 9.7 8.9 - 57.810.3 mg/dL  Total Protein 7.6 6.5 - 8.1 g/dL   Albumin 4.6 3.5 - 5.0 g/dL   AST 27 15 - 41 U/L   ALT 28 14 - 54 U/L   Alkaline Phosphatase 88 38 - 126 U/L   Total Bilirubin 0.5 0.3 - 1.2 mg/dL   GFR calc non Af Amer >60 >60 mL/min   GFR calc Af Amer >60 >60 mL/min   Anion gap 7 5 - 15  Lipase, blood  Result Value Ref Range   Lipase 28 11 - 51 U/L  Urinalysis, Routine w reflex microscopic (not at [facility])  Result Value Ref Range   Color, Urine YELLOW YELLOW   APPearance CLEAR CLEAR   Specific Gravity, Urine 1.017 1.005 - 1.030   pH 5.5 5.0 - 8.0   Glucose, UA NEGATIVE NEGATIVE mg/dL   Hgb urine dipstick TRACE (A) NEGATIVE   Bilirubin Urine NEGATIVE NEGATIVE   Ketones, ur NEGATIVE NEGATIVE mg/dL   Protein, ur NEGATIVE NEGATIVE mg/dL   Nitrite NEGATIVE NEGATIVE   Leukocytes, UA NEGATIVE NEGATIVE  Urine microscopic-add on  Result Value Ref Range   Squamous Epithelial / LPF 0-5 (A) NONE SEEN   WBC, UA 0-5 0 - 5 WBC/hpf   RBC / HPF 0-5 0 - 5 RBC/hpf   Bacteria, UA FEW (A) NONE SEEN   Urine-Other MUCOUS PRESENT    Ct Abdomen Pelvis W Contrast  04/02/2016  CLINICAL DATA:  Right lower quadrant and flank pain this week. History of diverticulitis. EXAM: CT ABDOMEN AND PELVIS WITH CONTRAST TECHNIQUE: Multidetector CT imaging of the abdomen and pelvis was performed using the standard protocol following bolus administration of intravenous contrast. CONTRAST:  ISOVUE-300 IOPAMIDOL (ISOVUE-300) INJECTION 61% COMPARISON:  03/06/2016 FINDINGS: Lower chest and abdominal wall:  No contributory findings. Hepatobiliary: Hepatic steatosis. No focal finding.Cholecystectomy. Chronic pneumobilia. No calcified choledocholithiasis. Pancreas: Dilated main pancreatic duct, 5 mm at the pancreatic head. Although the duct was normal caliber on comparison scan, the same appearance was seen 01/16/2016.  No visible obstructing [place] or mass. Spleen: Chronic small sub capsular nodule in the anterior gland, incidental. Adrenals/Urinary Tract: Negative adrenals. No hydronephrosis or [place]. Unremarkable bladder. Stomach/Bowel: No obstruction. Colonic diverticulosis, moderate for age. Negative appendix. Reproductive:Hysterectomy with negative adnexae. Vascular/Lymphatic: No acute vascular abnormality. No mass or adenopathy. Other: No ascites or pneumoperitoneum. Musculoskeletal: Negative IMPRESSION: 1. No new or acute finding.  Normal appendix. 2. Hepatic steatosis. 3. Colonic diverticulosis. 4. Chronic pancreatic duct dilatation. Status post cholecystectomy and presumed sphincterotomy. Electronically Signed   By: Marnee Spring M.D.   On: 04/02/2016 01:18    Medications  HYDROmorphone (DILAUDID) injection 1 mg (1 mg Intravenous Given 04/02/16 0004)  sodium chloride 0.9 % bolus 1,000 mL (0 mLs Intravenous Stopped 04/02/16 0112)  ondansetron (ZOFRAN) injection 4 mg (4 mg Intravenous Given 04/02/16 0003)  iopamidol (ISOVUE-300) 61 % injection 100 mL (100 mLs Intravenous Contrast Given 04/02/16 0038)   No acute findings on labs nor CT.  Follow up with your PMD and pain management for ongoing issues.  DEA database reviewed.  RX for bentyl and phenergan.  We do not refill narcotics or benzos.  All questions answered to patient's satisfaction. Based on history and exam patient has been appropriately medically screened and emergency conditions excluded. Patient is stable for discharge at this time. Follow up provided and strict return precautions given   I personally performed the services described in this documentation, which was scribed in my presence. The recorded information has been reviewed and is accurate.  Cy Blamer, MD 04/02/16 [phone]

## 2016-04-02 DIAGNOSIS — R1031 Right lower quadrant pain: Secondary | ICD-10-CM | POA: Diagnosis not present

## 2016-04-02 LAB — COMPREHENSIVE METABOLIC PANEL
ALT: 28 U/L (ref 14–54)
ANION GAP: 7 (ref 5–15)
AST: 27 U/L (ref 15–41)
Albumin: 4.6 g/dL (ref 3.5–5.0)
Alkaline Phosphatase: 88 U/L (ref 38–126)
BUN: 9 mg/dL (ref 6–20)
CHLORIDE: 107 mmol/L (ref 101–111)
CO2: 25 mmol/L (ref 22–32)
Calcium: 9.7 mg/dL (ref 8.9–10.3)
Creatinine, Ser: 0.57 mg/dL (ref 0.44–1.00)
Glucose, Bld: 101 mg/dL — ABNORMAL HIGH (ref 65–99)
Potassium: 3.7 mmol/L (ref 3.5–5.1)
SODIUM: 139 mmol/L (ref 135–145)
Total Bilirubin: 0.5 mg/dL (ref 0.3–1.2)
Total Protein: 7.6 g/dL (ref 6.5–8.1)

## 2016-04-02 LAB — LIPASE, BLOOD: LIPASE: 28 U/L (ref 11–51)

## 2016-04-02 NOTE — ED Notes (Signed)
EMT went to get vitals, but pt went for CT.

## 2016-04-02 NOTE — ED Notes (Signed)
See paper charting 

## 2016-04-10 DIAGNOSIS — R5382 Chronic fatigue, unspecified: Secondary | ICD-10-CM | POA: Insufficient documentation

## 2016-04-10 DIAGNOSIS — M5136 Other intervertebral disc degeneration, lumbar region: Secondary | ICD-10-CM | POA: Insufficient documentation

## 2016-04-10 DIAGNOSIS — G9332 Myalgic encephalomyelitis/chronic fatigue syndrome: Secondary | ICD-10-CM | POA: Insufficient documentation

## 2016-04-10 DIAGNOSIS — J42 Unspecified chronic bronchitis: Secondary | ICD-10-CM | POA: Insufficient documentation

## 2016-04-23 ENCOUNTER — Ambulatory Visit: Payer: Medicare Other | Admitting: Internal Medicine

## 2016-06-22 ENCOUNTER — Emergency Department (HOSPITAL_BASED_OUTPATIENT_CLINIC_OR_DEPARTMENT_OTHER)
Admission: EM | Admit: 2016-06-22 | Discharge: 2016-06-23 | Disposition: A | Discharge status: Home / Self Care | Payer: Medicare Other | Attending: Emergency Medicine | Admitting: Emergency Medicine

## 2016-06-22 ENCOUNTER — Encounter (HOSPITAL_BASED_OUTPATIENT_CLINIC_OR_DEPARTMENT_OTHER): Payer: Self-pay | Admitting: Emergency Medicine

## 2016-06-22 DIAGNOSIS — R61 Generalized hyperhidrosis: Secondary | ICD-10-CM | POA: Insufficient documentation

## 2016-06-22 DIAGNOSIS — R0982 Postnasal drip: Secondary | ICD-10-CM | POA: Diagnosis not present

## 2016-06-22 DIAGNOSIS — E119 Type 2 diabetes mellitus without complications: Secondary | ICD-10-CM | POA: Diagnosis not present

## 2016-06-22 DIAGNOSIS — R6883 Chills (without fever): Secondary | ICD-10-CM | POA: Insufficient documentation

## 2016-06-22 DIAGNOSIS — R0789 Other chest pain: Secondary | ICD-10-CM | POA: Diagnosis not present

## 2016-06-22 DIAGNOSIS — R05 Cough: Secondary | ICD-10-CM | POA: Diagnosis present

## 2016-06-22 DIAGNOSIS — J449 Chronic obstructive pulmonary disease, unspecified: Secondary | ICD-10-CM | POA: Insufficient documentation

## 2016-06-22 MED ORDER — IPRATROPIUM-ALBUTEROL 0.5-2.5 (3) MG/3ML IN SOLN
3.0000 mL | Freq: Once | RESPIRATORY_TRACT | Status: AC
  Administered 2016-06-23: 3 mL via RESPIRATORY_TRACT
  Filled 2016-06-22: qty 3

## 2016-06-22 MED ORDER — KETOROLAC TROMETHAMINE 60 MG/2ML IM SOLN
60.0000 mg | Freq: Once | INTRAMUSCULAR | Status: AC
  Administered 2016-06-23: 60 mg via INTRAMUSCULAR
  Filled 2016-06-22: qty 2

## 2016-06-22 NOTE — ED Provider Notes (Signed)
[facility] Provider Note   CSN: [id number] Arrival date & time: 06/22/16  2321  By signing my name below, I, Teofilo Pod, attest that this documentation has been prepared under the direction and in the presence of Dione Booze, MD . Electronically Signed: Teofilo Pod, ED Scribe. 06/22/2016. 11:42 PM.    History   Chief Complaint Chief Complaint  Patient presents with  . Chest Pain  . URI    The history is provided by the patient. No language interpreter was used.  URI   Associated symptoms include chest pain and cough. Pertinent negatives include no nausea and no vomiting.   HPI Comments:  Holly Rogers is a 55 y.o. female with PMHx and pneumonia, HLD, and DM who presents to the Emergency Department complaining of constant left-sided chest pain x5 days. Pt describes her pain a "severe" and "sharp". Pt states that the pain radiates to her left arm and has started to radiate to the right side of her chest. Pt complains of associated productive cough with yellow sputum, chills, diaphoresis, postnasal drip. Pt states that the pain is exacerbated when taking deep breaths and moving her left arm. Pt recently had a stress test with no sign of any blockages. Pt went to see PCP for the pain this last week and was prescribed mobic that has not provided relief. Pt also took Mucinex yesterday and today with no relief. Pt denies nausea, vomiting.   PCP: Karle Plumber, MD    Past Medical History:  Diagnosis Date  . Acid reflux   . Arthritis   . COPD (chronic obstructive pulmonary disease) (HCC)   . COPD (chronic obstructive pulmonary disease) (HCC)   . DDD (degenerative disc disease), lumbar   . DDD (degenerative disc disease), lumbar   . Diabetes mellitus without complication (HCC)   . Diverticulitis   . Diverticulosis   . Fibromyalgia   . Mitral valve prolapse   . Pancreatitis 1997  . Pneumonia   . Sarcoidosis (HCC)   . Sepsis (HCC)   . Sepsis [facility])      Patient Active Problem List   Diagnosis Date Noted  . Diverticulitis of intestine with abscess 10/18/2015  . Chest wall pain, chronic 08/23/2015  . Mild persistent asthma 06/15/2015  . Other allergic rhinitis 06/15/2015  . Sepsis (HCC) 03/05/2014  . CAP (community acquired pneumonia) 03/05/2014  . Sarcoidosis (HCC) 03/05/2014    Past Surgical History:  Procedure Laterality Date  . ABDOMINAL HYSTERECTOMY    . CHOLECYSTECTOMY      OB History    No data available       Home Medications    Prior to Admission medications   Medication Sig Start Date End Date Taking? Authorizing Provider  alprazolam Prudy Feeler) 2 MG tablet Take 2 mg by mouth daily.     Historical Provider, MD  b complex vitamins capsule Take by mouth.    Historical Provider, MD  benzonatate (TESSALON PERLES) 100 MG capsule Take 100 mg by mouth as needed.  09/20/15 09/19/16  Historical Provider, MD  cetirizine (ZYRTEC) 10 MG tablet Take 10 mg by mouth daily.    Historical Provider, MD  Cyanocobalamin (B-12 PO) Take 1 tablet by mouth daily.    Historical Provider, MD  diclofenac sodium (VOLTAREN) 1 % GEL Apply 1 application topically 3 (three) times daily as needed (for pain).  01/18/14   Historical Provider, MD  dicyclomine (BENTYL) 10 MG capsule Take 10 mg by mouth 4 (four) times daily -  before meals and at bedtime. Reported on 10/17/2015    Historical Provider, MD  fexofenadine (ALLEGRA) 180 MG tablet Take 180 mg by mouth daily.    Historical Provider, MD  fluticasone (FLONASE) 50 MCG/ACT nasal spray Place 2 sprays into both nostrils daily. 01/30/16   Mikki SanteeSokun Bhatti, MD  glucose blood (ONETOUCH VERIO) test strip USE 1 STRIP TO SKIN TWICE DAILY.  DX CODE: E11.9 03/28/15   Historical Provider, MD  HYDROcodone-acetaminophen (NORCO/VICODIN) 5-325 MG tablet Take 1 tablet by mouth 2 (two) times daily. Reported on 01/30/2016    Historical Provider, MD  hydrocortisone 2.5 % cream Apply 1 application topically 2 (two) times daily.  Reported on 01/30/2016    Historical Provider, MD  hydroxypropyl methylcellulose (ISOPTO TEARS) 2.5 % ophthalmic solution 1 drop. 12/14/14   Historical Provider, MD  ibuprofen (ADVIL,MOTRIN) 200 MG tablet Take 200 mg by mouth every 6 (six) hours as needed.    Historical Provider, MD  levalbuterol [facility](XOPENEX HFA) 45 MCG/ACT inhaler Inhale 2 puffs into the lungs every 4 (four) hours as needed for wheezing. 01/30/16   Mikki SanteeSokun Bhatti, MD  levalbuterol (XOPENEX) 1.25 MG/3ML nebulizer solution Take 1.25 mg by nebulization every 6 (six) hours as needed for wheezing or shortness of breath. 02/19/16   Fletcher AnonJose A Bardelas, MD  montelukast (SINGULAIR) 10 MG tablet Take 10 mg by mouth at bedtime. 12/02/13   Historical Provider, MD  Multiple Vitamin (MULTI-VITAMINS) TABS Take by mouth.    Historical Provider, MD  oxyCODONE-acetaminophen (PERCOCET/ROXICET) 5-325 MG tablet Take by mouth every 4 (four) hours as needed for severe pain.    Historical Provider, MD  polyvinyl alcohol (LIQUIFILM TEARS) 1.4 % ophthalmic solution Reported on 01/30/2016    Historical Provider, MD  potassium chloride SA (K-DUR,KLOR-CON) 20 MEQ tablet Take 20 mEq by mouth daily. 01/17/14   Historical Provider, MD  pregabalin (LYRICA) 50 MG capsule Take by mouth. Reported on 01/30/2016 08/29/15 08/28/16  Historical Provider, MD  promethazine (PHENERGAN) 25 MG tablet Take 1 tablet (25 mg total) by mouth every 6 (six) hours as needed for nausea or vomiting. 03/06/15   Gwyneth SproutWhitney Plunkett, MD  senna (SENOKOT) 8.6 MG tablet Take by mouth.    Historical Provider, MD  sucralfate (CARAFATE) 1 GM/10ML suspension Take 10 mLs (1 g total) by mouth 4 (four) times daily -  with meals and at bedtime. 01/30/16   April Palumbo, MD  tizanidine (ZANAFLEX) 2 MG capsule Take 2 mg by mouth 3 (three) times daily as needed for muscle spasms. Reported on 01/30/2016    Historical Provider, MD  triamcinolone (NASACORT ALLERGY 24HR) 55 MCG/ACT AERO nasal inhaler Place 2 sprays into the nose  daily as needed (for stuffy nose). Reported on 01/30/2016    Historical Provider, MD  triamcinolone cream (KENALOG) 0.1 % Apply 1 application topically daily as needed (for dry skin).  08/17/13   Historical Provider, MD  Vitamin D, Cholecalciferol, 400 units CAPS Take by mouth. Reported on 01/30/2016 07/09/07   Historical Provider, MD  Vitamin D, Ergocalciferol, (DRISDOL) 50000 units CAPS capsule TAKE 1 CAPSULE BY MOUTH EVERY WEEK 12/29/14   Historical Provider, MD    Family History Family History  Problem Relation Age of Onset  . Asthma Mother   . COPD Mother     smoked  . Rheum arthritis Mother   . Skin cancer Mother     Social History Social History  Substance Use Topics  . Smoking status: Never Smoker  . Smokeless tobacco: Never Used  . Alcohol  use No     Allergies   Amoxicillin; Avandia [rosiglitazone]; Avelox [moxifloxacin hcl in nacl]; Baclofen; Cefdinir; Ceftin [cefuroxime axetil]; Celebrex [celecoxib]; Ciprofloxacin; Clindamycin/lincomycin; Doxycycline; Erythromycin base; Flagyl [metronidazole]; Latex; Levaquin [levofloxacin in d5w]; Levofloxacin; Macrobid [nitrofurantoin monohyd macro]; Macrodantin [nitrofurantoin macrocrystal]; Metformin and related; Neurontin [gabapentin]; Other; Sulfa antibiotics; Symbicort [budesonide-formoterol fumarate]; Tramadol hcl; Ultram [tramadol]; Valdecoxib; Vancomycin; Vioxx [rofecoxib]; Voltaren [diclofenac sodium]; and Tape   Review of Systems Review of Systems  Constitutional: Positive for chills and diaphoresis.  HENT: Positive for postnasal drip.   Respiratory: Positive for cough.   Cardiovascular: Positive for chest pain.  Gastrointestinal: Negative for nausea and vomiting.  All other systems reviewed and are negative.    Physical Exam Updated Vital Signs BP 146/93   Pulse 74   Temp 98.4 F (36.9 C)   Resp 20   Ht 5\' 3"  (1.6 m)   Wt 172 lb (78 kg)   SpO2 96%   BMI 30.47 kg/m   Physical Exam  Constitutional: She appears  well-developed and well-nourished. No distress.  HENT:  Head: Normocephalic and atraumatic.  Eyes: Conjunctivae are normal.  Cardiovascular: Normal rate.   Pulmonary/Chest: Effort normal. She exhibits tenderness ( Moderate, left anterior chest wall.  ).  Faint wheezing left lower lobe.  Abdominal: She exhibits no distension.  Musculoskeletal:  1+ pitting edema  Neurological: She is alert.  Skin: Skin is warm and dry.  Psychiatric: She has a normal mood and affect.  Nursing note and vitals reviewed.    ED Treatments / Results  DIAGNOSTIC STUDIES:  Oxygen Saturation is 96% on RA, normal by my interpretation.    COORDINATION OF CARE:  11:42 PM Discussed treatment plan with pt at bedside and pt agreed to plan.   Labs (all labs ordered are listed, but only abnormal results are displayed) Labs Reviewed  CBC WITH DIFFERENTIAL/PLATELET - Abnormal; Notable for the following:       Result Value   RBC 3.72 (*)    Hemoglobin 11.6 (*)    HCT 34.2 (*)    All other components within normal limits  TROPONIN I  BASIC METABOLIC PANEL  CBC WITH DIFFERENTIAL/PLATELET    EKG  EKG Interpretation  Date/Time:  Sunday June 22 2016 23:44:13 EDT Ventricular Rate:  70 PR Interval:    QRS Duration: 94 QT Interval:  401 QTC Calculation: 433 R Axis:   48 Text Interpretation:  Sinus rhythm Baseline wander in lead(s) V3 Otherwise within normal limits When compared with ECG of 01/29/2016, No significant change was found Confirmed by [facility]  MD, Tykwon Fera ([id number]) on 06/22/2016 11:48:27 PM       Radiology Dg Chest 2 View  Result Date: 06/23/2016 CLINICAL DATA:  Acute onset of left-sided chest pain, shortness of breath and cough. Initial encounter. EXAM: CHEST  2 VIEW COMPARISON:  Chest radiograph performed 04/29/2016 FINDINGS: The lungs are well-aerated and clear. There is no evidence of focal opacification, pleural effusion or pneumothorax. The heart is normal in size; the mediastinal contour is  within normal limits. No acute osseous abnormalities are seen. Clips are noted within the right upper quadrant, reflecting prior cholecystectomy. IMPRESSION: No acute cardiopulmonary process seen. Electronically Signed   By: Roanna Raider M.D.   On: 06/23/2016 01:24    Procedures Procedures (including critical care time)  Medications Ordered in ED Medications  ketorolac (TORADOL) injection 60 mg (60 mg Intramuscular Given 06/23/16 0032)  ipratropium-albuterol (DUONEB) 0.5-2.5 (3) MG/3ML nebulizer solution 3 mL (3 mLs Nebulization Given 06/23/16 0004)  dexamethasone (DECADRON) tablet 12 mg (12 mg Oral Given 06/23/16 0248)     Initial Impression / Assessment and Plan / ED Course  I have reviewed the triage vital signs and the nursing notes.  Pertinent labs & imaging results that were available during my care of the patient were reviewed by me and considered in my medical decision making (see chart for details).  Clinical Course   Chest pain which seems to be clearly chest wall pain based on exam. Old records are reviewed and she has been evaluated for chest wall pain in the past. Also, nuclear stress test done August 22 was normal. There is slight wheezing on the left so she will be given a nebulizer treatment albuterol and ipratropium. Chest x-ray will be checked. Of note, patient states that she has sarcoidosis but evaluation by pulmonology felt that her sarcoidosis was not clinically active. She claims ketorolac is been helpful in the past so she is given an injection of ketorolac even know she is currently on an NSAID.  She did have moderate improvement with ketorolac. Chest x-ray shows no evidence of pneumonia. I suspect she may have a viral pleurisy and she's given a dose of dexamethasone. She is given reassurance and discharged with instructions to continue taking Motrin but only to take it once a day. Advised to continue use of acetaminophen for additional pain relief. Follow-up with  PCP.  Final Clinical Impressions(s) / ED Diagnoses   Final diagnoses:  Chest wall pain    New Prescriptions New Prescriptions   No medications on file  I personally performed the services described in this documentation, which was scribed in my presence. The recorded information has been reviewed and is accurate.       Dione Booze, MD 06/23/16 0300

## 2016-06-22 NOTE — ED Triage Notes (Signed)
Pt in with multiple complaints. Pt with L chest pain under and around breast, also c/o URI sx and ear pain. Pt saw her PMD for same and was given mobic for pain with no relief. Hx of sarcoidosis and pneumonia. Pt alert, interactive, in NAD.

## 2016-06-23 ENCOUNTER — Emergency Department (HOSPITAL_BASED_OUTPATIENT_CLINIC_OR_DEPARTMENT_OTHER): Payer: Medicare Other

## 2016-06-23 DIAGNOSIS — R0789 Other chest pain: Secondary | ICD-10-CM | POA: Diagnosis not present

## 2016-06-23 LAB — CBC WITH DIFFERENTIAL/PLATELET
BASOS PCT: 0 %
Basophils Absolute: 0 10*3/uL (ref 0.0–0.1)
EOS ABS: 0.1 10*3/uL (ref 0.0–0.7)
EOS PCT: 1 %
HCT: 34.2 % — ABNORMAL LOW (ref 36.0–46.0)
HEMOGLOBIN: 11.6 g/dL — AB (ref 12.0–15.0)
LYMPHS ABS: 2 10*3/uL (ref 0.7–4.0)
Lymphocytes Relative: 31 %
MCH: 31.2 pg (ref 26.0–34.0)
MCHC: 33.9 g/dL (ref 30.0–36.0)
MCV: 91.9 fL (ref 78.0–100.0)
MONO ABS: 0.6 10*3/uL (ref 0.1–1.0)
MONOS PCT: 10 %
Neutro Abs: 3.6 10*3/uL (ref 1.7–7.7)
Neutrophils Relative %: 58 %
PLATELETS: 228 10*3/uL (ref 150–400)
RBC: 3.72 MIL/uL — ABNORMAL LOW (ref 3.87–5.11)
RDW: 11.6 % (ref 11.5–15.5)
WBC: 6.3 10*3/uL (ref 4.0–10.5)

## 2016-06-23 LAB — BASIC METABOLIC PANEL
Anion gap: 7 (ref 5–15)
BUN: 15 mg/dL (ref 6–20)
CALCIUM: 9.4 mg/dL (ref 8.9–10.3)
CHLORIDE: 107 mmol/L (ref 101–111)
CO2: 26 mmol/L (ref 22–32)
CREATININE: 0.77 mg/dL (ref 0.44–1.00)
GFR calc non Af Amer: >60 mL/min (ref >60)
GLUCOSE: 91 mg/dL (ref 65–99)
Potassium: 3.9 mmol/L (ref 3.5–5.1)
Sodium: 140 mmol/L (ref 135–145)

## 2016-06-23 LAB — TROPONIN I: Troponin I: <0.03 ng/mL (ref <0.03)

## 2016-06-23 MED ORDER — DEXAMETHASONE 6 MG PO TABS
12.0000 mg | ORAL_TABLET | Freq: Once | ORAL | Status: AC
  Administered 2016-06-23: 12 mg via ORAL
  Filled 2016-06-23: qty 2

## 2016-06-23 NOTE — Discharge Instructions (Signed)
Do not take your Mobic more than once a day. You may take acetaminophen (Tylenol) as needed to get additional pain relief.

## 2016-06-23 NOTE — ED Notes (Signed)
"  feel better after breathing tx, decreased pain, still feel some sob (mild) and some nausea (mild), also jittery". To xray in w/c. Alert, NAD, calm, interactive, resps e/u, no dyspnea noted, skin W&D, speaking in clear complete sentences.

## 2016-07-04 ENCOUNTER — Ambulatory Visit (INDEPENDENT_AMBULATORY_CARE_PROVIDER_SITE_OTHER): Payer: Medicare Other | Admitting: Pediatrics

## 2016-07-04 ENCOUNTER — Encounter: Payer: Self-pay | Admitting: Pediatrics

## 2016-07-04 VITALS — BP 106/68 | HR 81 | Temp 97.8°F | Resp 16

## 2016-07-04 DIAGNOSIS — J3089 Other allergic rhinitis: Secondary | ICD-10-CM | POA: Diagnosis not present

## 2016-07-04 DIAGNOSIS — J4530 Mild persistent asthma, uncomplicated: Secondary | ICD-10-CM

## 2016-07-04 DIAGNOSIS — D869 Sarcoidosis, unspecified: Secondary | ICD-10-CM

## 2016-07-04 MED ORDER — LEVALBUTEROL HCL 1.25 MG/3ML IN NEBU: 1.2500 mg | INHALATION_SOLUTION | Freq: Four times a day (QID) | RESPIRATORY_TRACT | 1 refills | Status: DC | PRN

## 2016-07-04 MED ORDER — LEVALBUTEROL TARTRATE 45 MCG/ACT IN AERO: 2.0000 | INHALATION_SPRAY | RESPIRATORY_TRACT | 5 refills | Status: DC | PRN

## 2016-07-04 NOTE — Progress Notes (Signed)
[address] [place] [address] Dept: [phone]  FOLLOW UP NOTE  Patient ID: Holly Rogers, female    DOB: [birth year]  Age: 55 y.o. MRN: [id number] Date of Office Visit: 07/04/2016  Assessment  Chief Complaint: Allergic Rhinitis ; Asthma; Cough (for several days); and Wheezing (for several days)  HPI Holly Rogers presents for evaluation of a cough for 2 days. She has had mild wheezing. She had a respiratory infection 3 weeks ago. She saw her family doctor yesterday and he didn't even examine her. She has had a mild runny nose. She sees a pulmonary specialist for sarcoid  Current medications are Zyrtec 10 mg once a day, fluticasone 2 sprays per nostril once a day if needed, montelukast 10 mg once a day, Xopenex 2 puffs every 6 hours if needed or instead Xopenex 1.25 one unit dose every 6 hours if needed. Her other medications are outlined in the chart   Drug Allergies:  Allergies  Allergen Reactions  . Avandia [Rosiglitazone] Other (See Comments)    Heart heart Chest pain  . Flagyl [Metronidazole] Nausea And Vomiting and Other (See Comments)    Severe stomach pain  . Metformin Other (See Comments)    Chest pain  . Tramadol Hcl Other (See Comments)  . Valdecoxib Other (See Comments)    Unknown Unknown  . Vancomycin Nausea And Vomiting  . Amoxicillin Nausea Only  . Avelox [Moxifloxacin Hcl In Nacl]     Patient is unsure what happens   . Baclofen Nausea Only    Unknown, patient knows she couldn't take it  . Cefdinir Other (See Comments)    No reaction listed. Ask pt and enter  . Ceftin [Cefuroxime Axetil]     unknown  . Celebrex [Celecoxib]     Chest pain  . Ciprofloxacin Nausea And Vomiting and Diarrhea  . Clindamycin Other (See Comments)    Cannot remember  . Clindamycin/Lincomycin     unknown  . Doxycycline     unknown  . Erythromycin Base Other (See Comments)    Ask pt and enter  . Latex Other (See Comments)    Ask pt and enter  . Levaquin [Levofloxacin  In D5w]     unknown  . Levofloxacin Other (See Comments)  . Macrobid [employer] Macro]     unknown  . Macrodantin [Nitrofurantoin Macrocrystal]   . Metformin And Related     Chest pain  . Neurontin [Gabapentin]     Gave patient problems  . Nitrofurantoin     unknown  . Other Nausea And Vomiting    Darvocet  . Propoxyphene Nausea And Vomiting  . Sulfa Antibiotics Nausea And Vomiting  . Symbicort [Budesonide-Formoterol Fumarate]     Shakes  . Tramadol Hcl   . Ultram [Tramadol] Other (See Comments)    Hallucination unknown  . Vioxx [Rofecoxib] Other (See Comments)    Heart unknown  . Voltaren [Diclofenac Sodium] Diarrhea    Unknown. But she can use the gel  . Ertapenem Dermatitis  . Sulfacetamide Sodium Nausea And Vomiting  . Tape Rash    Can use paper tape    Physical Exam: BP 106/68 (BP Location: Right Arm, Patient Position: Sitting, Cuff Size: Normal)   Pulse 81   Temp 97.8 F (36.6 C) (Oral)   Resp 16   SpO2 96%    Physical Exam  Constitutional: She is oriented to person, place, and time. She appears well-developed and well-nourished.  HENT:  Eyes normal. Ears normal. Nose normal.  Pharynx normal.  Neck: Neck supple.  Cardiovascular:  S1 and S2 normal no murmurs  Pulmonary/Chest:  Clear to percussion and auscultation  Lymphadenopathy:    She has no cervical adenopathy.  Neurological: She is alert and oriented to person, place, and time.  Psychiatric: She has a normal mood and affect. Her behavior is normal. Judgment and thought content normal.  Vitals reviewed.   Diagnostics:  FVC 3.21 L FEV1 2.79 L. Predicted FVC 3.48 L predicted FEV1 2.73 L-the spirometry is in the normal range  Assessment and Plan: 1. Mild persistent asthma without complication   2. Other allergic rhinitis   3. Sarcoid (HCC)     Meds ordered this encounter  Medications  . levalbuterol (XOPENEX HFA) 45 MCG/ACT inhaler    Sig: Inhale 2 puffs into the lungs every 4  (four) hours as needed for wheezing.    Dispense:  1 Inhaler    Refill:  5  . levalbuterol (XOPENEX) 1.25 MG/3ML nebulizer solution    Sig: Take 1.25 mg by nebulization every 6 (six) hours as needed for wheezing or shortness of breath.    Dispense:  75 mL    Refill:  1    BILL MEDICARE PART B. DX IS J45.30      GENERIC IS OK.    Patient Instructions  Continue on her current medications She should have a flu vaccination Add prednisone 10 mg twice a day for 4 days, 10 mg on the fifth day to bring her allergic symptoms and asthma under control   Return in about 1 year (around 07/04/2017).    Thank you for the opportunity to care for this patient.  Please do not hesitate to contact me with questions.  Tonette BihariJ. A. Flavio Lindroth, M.D.  [facility] [place] [address] [place], [place] [address] [phone]

## 2016-07-05 NOTE — Patient Instructions (Signed)
Continue on her current medications She should have a flu vaccination Add prednisone 10 mg twice a day for 4 days, 10 mg on the fifth day to bring her allergic symptoms and asthma under control

## 2016-07-15 ENCOUNTER — Telehealth: Payer: Self-pay | Admitting: Allergy

## 2016-07-15 NOTE — Telephone Encounter (Signed)
DONE

## 2016-07-15 NOTE — Telephone Encounter (Signed)
Informed patient xopenex has been approved.Referral number [id number].

## 2016-07-24 ENCOUNTER — Ambulatory Visit (INDEPENDENT_AMBULATORY_CARE_PROVIDER_SITE_OTHER): Payer: Medicare Other | Admitting: Allergy and Immunology

## 2016-07-24 ENCOUNTER — Encounter: Payer: Self-pay | Admitting: Allergy and Immunology

## 2016-07-24 VITALS — BP 108/76 | HR 77 | Temp 97.2°F | Resp 16

## 2016-07-24 DIAGNOSIS — J0100 Acute maxillary sinusitis, unspecified: Secondary | ICD-10-CM | POA: Diagnosis not present

## 2016-07-24 DIAGNOSIS — D869 Sarcoidosis, unspecified: Secondary | ICD-10-CM

## 2016-07-24 DIAGNOSIS — J4531 Mild persistent asthma with (acute) exacerbation: Secondary | ICD-10-CM | POA: Diagnosis not present

## 2016-07-24 MED ORDER — AZELASTINE HCL 0.1 % NA SOLN: NASAL | 5 refills | Status: DC

## 2016-07-24 MED ORDER — BECLOMETHASONE DIPROPIONATE 40 MCG/ACT IN AERS: 2.0000 | INHALATION_SPRAY | Freq: Two times a day (BID) | RESPIRATORY_TRACT | 5 refills | Status: DC

## 2016-07-24 MED ORDER — PREDNISOLONE 15 MG/5ML PO SOLN: ORAL | 0 refills | Status: DC

## 2016-07-24 NOTE — Progress Notes (Signed)
Follow-up Note  RE: Holly Rogers MRN: [id number] DOB: [birth year] Date of Office Visit: 07/24/2016  Primary care provider: Karle Plumber, MD Referring provider: Karle Plumber, MD  History of present illness: Holly Rogers is a 55 y.o. female with asthma, allergic rhinitis, and sarcoid presenting today for a sick visit.  She was last seen in this clinic by Dr. Beaulah Dinning on 07/04/2016.  She reports that in August she began to experience nasal congestion, sinus pressure, postnasal drainage, and irritated throat.  In addition, she began to experience increased chest tightness, coughing, and wheezing.  On October 20, she was given a prednisone taper. She reports that while on the prednisone her symptoms resolved completely however the prednisone exacerbated her anxiety.  The symptoms returned shortly after the prednisone taper was completed.  She has sarcoid and reports that she plans to see her pulmonologist in the near future as her last visit was over a year ago.   Assessment and plan: Mild persistent asthma with exacerbation  A prescription has been provided for prednisolone 15 mg/5 mL; 5 mL twice a day 5 days, then 5 mL on day 6, then 2.5 mL on day 7, then stop.  During respiratory tract infections or asthma flares, add Qvar 40 g to 2 inhalations 2 times per day until symptoms have returned to baseline.  To maximize pulmonary deposition, a spacer has been provided along with instructions for its proper administration with an HFA inhaler.  Continue montelukast 10 mg daily at bedtime and albuterol HFA, 1-2 inhalations every 4-6 hours as needed.  Acute sinusitis  Prednisolone has been prescribed (as above).  A prescription has been provided for azelastine nasal spray, 1-2 sprays per nostril 2 times daily as needed. Proper nasal spray technique has been discussed and demonstrated.   Nasal saline lavage (NeilMed) as needed has been recommended prior to medicated nasal sprays and as  needed along with instructions for proper administration.  For thick post nasal drainage, add guaifenesin [phone] mg (Mucinex)  twice daily as needed with adequate hydration as discussed.  Sarcoid (HCC)  Follow up with pulmonologist as recommended.   Meds ordered this encounter  Medications  . prednisoLONE (PRELONE) 15 MG/5ML SOLN    Sig: One teaspoonful by mouth twice daily for 5 days,then 1 teaspoonful day 6, then 1/2 teaspoonful on day 7, then stop.    Dispense:  60 mL    Refill:  0  . beclomethasone (QVAR) 40 MCG/ACT inhaler    Sig: Inhale 2 puffs into the lungs 2 (two) times daily.    Dispense:  1 Inhaler    Refill:  5    To prevent cough or wheeze. Use with spacer. Rinse,gargle and spit.  Marland Kitchen azelastine (ASTELIN) 0.1 % nasal spray    Sig: Take 1-2 sprays per nostril 2 times daily as needed.    Dispense:  30 mL    Refill:  5    For runny nose    Diagnostics: Spirometry:  Normal with an FEV1 of 106% predicted.  Please see scanned spirometry results for details.    Physical examination: Blood pressure 108/76, pulse 77, temperature 97.2 F (36.2 C), temperature source Oral, resp. rate 16, SpO2 97 %.  General: Alert, interactive, in no acute distress. HEENT: TMs pearly gray, turbinates edematous with thick discharge, post-pharynx erythematous. Neck: Supple without palpable lymphadenopathy. Lungs: Clear to auscultation without wheezing, rhonchi or rales. CV: Normal S1, S2 without murmurs. Skin: Warm and dry, without lesions or rashes.  The  following portions of the patient's history were reviewed and updated as appropriate: allergies, current medications, past family history, past medical history, past social history, past surgical history and problem list.    Medication List       Accurate as of 07/24/16  6:47 PM. Always use your most recent med list.          acetaminophen 650 MG CR tablet Commonly known as:  TYLENOL Take 650 mg by mouth.   albuterol 108 (90  Base) MCG/ACT inhaler Commonly known as:  PROVENTIL HFA;VENTOLIN HFA Inhale into the lungs.   albuterol (2.5 MG/3ML) 0.083% nebulizer solution Commonly known as:  PROVENTIL USE 1 VIAL EVERY 6 HOURS AS NEEDED FOR WHEEZING   azelastine 0.1 % nasal spray Commonly known as:  ASTELIN Take 1-2 sprays per nostril 2 times daily as needed.   B-12 PO Take 1 tablet by mouth daily.   beclomethasone 40 MCG/ACT inhaler Commonly known as:  QVAR Inhale 2 puffs into the lungs 2 (two) times daily.   benzonatate 100 MG capsule Commonly known as:  TESSALON Take by mouth.   diclofenac sodium 1 % Gel Commonly known as:  VOLTAREN Apply 1 application topically 3 (three) times daily as needed (for pain).   dicyclomine 10 MG capsule Commonly known as:  BENTYL Take 10 mg by mouth 4 (four) times daily -  before meals and at bedtime. Reported on 10/17/2015   fexofenadine 180 MG tablet Commonly known as:  ALLEGRA Take 180 mg by mouth daily.   fluticasone 50 MCG/ACT nasal spray Commonly known as:  FLONASE Place 2 sprays into both nostrils daily.   HYDROcodone-acetaminophen 5-325 MG tablet Commonly known as:  NORCO/VICODIN Take 1 tablet by mouth 2 (two) times daily. Reported on 01/30/2016   hydrocortisone 2.5 % cream Apply 1 application topically 2 (two) times daily. Reported on 01/30/2016   hydroxypropyl methylcellulose 2.5 % ophthalmic solution Commonly known as:  ISOPTO TEARS 1 drop.   ibuprofen 200 MG tablet Commonly known as:  ADVIL,MOTRIN Take 200 mg by mouth every 6 (six) hours as needed.   levalbuterol 1.25 MG/3ML nebulizer solution Commonly known as:  XOPENEX Take 1.25 mg by nebulization every 6 (six) hours as needed for wheezing or shortness of breath.   levalbuterol 45 MCG/ACT inhaler Commonly known as:  XOPENEX HFA Inhale 2 puffs into the lungs every 4 (four) hours as needed for wheezing.   meloxicam 15 MG tablet Commonly known as:  MOBIC   montelukast 10 MG tablet Commonly  known as:  SINGULAIR Take 10 mg by mouth at bedtime.   MULTI-VITAMINS Tabs Take by mouth.   NASACORT ALLERGY 24HR 55 MCG/ACT Aero nasal inhaler Generic drug:  triamcinolone Place 2 sprays into the nose daily as needed (for stuffy nose). Reported on 01/30/2016   ondansetron 8 MG disintegrating tablet Commonly known as:  ZOFRAN-ODT Take by mouth.   ONETOUCH VERIO test strip Generic drug:  glucose blood USE 1 STRIP TO SKIN TWICE DAILY.  DX CODE: E11.9   oxyCODONE-acetaminophen 10-325 MG tablet Commonly known as:  PERCOCET TK ONE T PO Q 4 H PRN P   polyethylene glycol powder powder Commonly known as:  GLYCOLAX/MIRALAX Take by mouth.   polyvinyl alcohol 1.4 % ophthalmic solution Commonly known as:  LIQUIFILM TEARS Reported on 01/30/2016   potassium chloride SA 20 MEQ tablet Commonly known as:  K-DUR,KLOR-CON Take 20 mEq by mouth daily.   prednisoLONE 15 MG/5ML Soln Commonly known as:  PRELONE One teaspoonful by mouth  twice daily for 5 days,then 1 teaspoonful day 6, then 1/2 teaspoonful on day 7, then stop.   promethazine 25 MG tablet Commonly known as:  PHENERGAN Take 1 tablet (25 mg total) by mouth every 6 (six) hours as needed for nausea or vomiting.   senna 8.6 MG tablet Commonly known as:  SENOKOT Take by mouth.   sucralfate 1 GM/10ML suspension Commonly known as:  CARAFATE Take 10 mLs (1 g total) by mouth 4 (four) times daily -  with meals and at bedtime.   tiZANidine 4 MG tablet Commonly known as:  ZANAFLEX   triamcinolone cream 0.1 % Commonly known as:  KENALOG Apply topically.   Vitamin D (Cholecalciferol) 400 units Caps Take by mouth. Reported on 01/30/2016       Allergies  Allergen Reactions  . Avandia [Rosiglitazone] Other (See Comments)    Heart heart Chest pain  . Flagyl [Metronidazole] Nausea And Vomiting and Other (See Comments)    Severe stomach pain  . Metformin Other (See Comments)    Chest pain  . Tramadol Hcl Other (See Comments)  .  Valdecoxib Other (See Comments)    Unknown Unknown  . Vancomycin Nausea And Vomiting  . Amoxicillin Nausea Only  . Avelox [Moxifloxacin Hcl In Nacl]     Patient is unsure what happens   . Baclofen Nausea Only    Unknown, patient knows she couldn't take it  . Cefdinir Other (See Comments)    No reaction listed. Ask pt and enter  . Ceftin [Cefuroxime Axetil]     unknown  . Celebrex [Celecoxib]     Chest pain  . Ciprofloxacin Nausea And Vomiting and Diarrhea  . Clindamycin Other (See Comments)    Cannot remember  . Clindamycin/Lincomycin     unknown  . Doxycycline     unknown  . Erythromycin Base Other (See Comments)    Ask pt and enter  . Latex Other (See Comments)    Ask pt and enter  . Levaquin [Levofloxacin In D5w]     unknown  . Levofloxacin Other (See Comments)  . Macrobid [employer] Macro]     unknown  . Macrodantin [Nitrofurantoin Macrocrystal]   . Metformin And Related     Chest pain  . Neurontin [Gabapentin]     Gave patient problems  . Nitrofurantoin     unknown  . Other Nausea And Vomiting    Darvocet  . Propoxyphene Nausea And Vomiting  . Sulfa Antibiotics Nausea And Vomiting  . Symbicort [Budesonide-Formoterol Fumarate]     Shakes  . Tramadol Hcl   . Ultram [Tramadol] Other (See Comments)    Hallucination unknown  . Vioxx [Rofecoxib] Other (See Comments)    Heart unknown  . Voltaren [Diclofenac Sodium] Diarrhea    Unknown. But she can use the gel  . Ertapenem Dermatitis  . Sulfacetamide Sodium Nausea And Vomiting  . Tape Rash    Can use paper tape   Review of systems: Review of systems negative except as noted in HPI / PMHx or noted below: Constitutional: Negative.  HENT: Negative.   Eyes: Negative.  Respiratory: Negative.   Cardiovascular: Negative.  Gastrointestinal: Negative.  Genitourinary: Negative.  Musculoskeletal: Negative.  Neurological: Negative.  Endo/Heme/Allergies: Negative.  Cutaneous: Negative.  Past  Medical History:  Diagnosis Date  . Acid reflux   . Arthritis   . COPD (chronic obstructive pulmonary disease) (HCC)   . COPD (chronic obstructive pulmonary disease) (HCC)   . DDD (degenerative disc disease), lumbar   .  DDD (degenerative disc disease), lumbar   . Diabetes mellitus without complication (HCC)   . Diverticulitis   . Diverticulosis   . Fibromyalgia   . Mitral valve prolapse   . Pancreatitis 1997  . Pneumonia   . Sarcoidosis (HCC)   . Sepsis (HCC)   . Sepsis (HCC)     Family History  Problem Relation Age of Onset  . Asthma Mother   . COPD Mother     smoked  . Rheum arthritis Mother   . Skin cancer Mother     Social History   Social History  . Marital status: Single    Spouse name: N/A  . Number of children: N/A  . Years of education: N/A   Occupational History  . Disabled    Social History Main Topics  . Smoking status: Never Smoker  . Smokeless tobacco: Never Used  . Alcohol use No  . Drug use: No  . Sexual activity: Not on file   Other Topics Concern  . Not on file   Social History Narrative  . No narrative on file    I appreciate the opportunity to take part in Marzell's care. Please do not hesitate to contact me with questions.  Sincerely,   R. Jorene Guestarter Aarvi Stotts, MD

## 2016-07-24 NOTE — Patient Instructions (Addendum)
Mild persistent asthma with exacerbation  A prescription has been provided for prednisolone 15 mg/5 mL; 5 mL twice a day  5 days, then 5 mL on day 6, then 2.5 mL on day 7, then stop.  During respiratory tract infections or asthma flares, add Qvar 40 g to 2 inhalations 2 times per day until symptoms have returned to baseline.  To maximize pulmonary deposition, a spacer has been provided along with instructions for its proper administration with an HFA inhaler.  Continue montelukast 10 mg daily at bedtime and albuterol HFA, 1-2 inhalations every 4-6 hours as needed.  Acute sinusitis  Prednisolone has been prescribed (as above).  A prescription has been provided for azelastine nasal spray, 1-2 sprays per nostril 2 times daily as needed. Proper nasal spray technique has been discussed and demonstrated.   Nasal saline lavage (NeilMed) as needed has been recommended prior to medicated nasal sprays and as needed along with instructions for proper administration.  For thick post nasal drainage, add guaifenesin [phone] mg (Mucinex)  twice daily as needed with adequate hydration as discussed.  Sarcoid (HCC)  Follow up with pulmonologist as recommended.

## 2016-07-24 NOTE — Assessment & Plan Note (Addendum)
   A prescription has been provided for prednisolone 15 mg/5 mL; 5 mL twice a day 5 days, then 5 mL on day 6, then 2.5 mL on day 7, then stop.  During respiratory tract infections or asthma flares, add Qvar 40 g to 2 inhalations 2 times per day until symptoms have returned to baseline.  To maximize pulmonary deposition, a spacer has been provided along with instructions for its proper administration with an HFA inhaler.  Continue montelukast 10 mg daily at bedtime and albuterol HFA, 1-2 inhalations every 4-6 hours as needed.

## 2016-07-24 NOTE — Assessment & Plan Note (Signed)
   Follow up with pulmonologist as recommended.

## 2016-07-24 NOTE — Assessment & Plan Note (Addendum)
   Prednisolone has been prescribed (as above).  A prescription has been provided for azelastine nasal spray, 1-2 sprays per nostril 2 times daily as needed. Proper nasal spray technique has been discussed and demonstrated.   Nasal saline lavage (NeilMed) as needed has been recommended prior to medicated nasal sprays and as needed along with instructions for proper administration.  For thick post nasal drainage, add guaifenesin [phone] mg (Mucinex)  twice daily as needed with adequate hydration as discussed.

## 2016-08-04 ENCOUNTER — Emergency Department (HOSPITAL_BASED_OUTPATIENT_CLINIC_OR_DEPARTMENT_OTHER): Payer: Medicare Other

## 2016-08-04 ENCOUNTER — Encounter (HOSPITAL_BASED_OUTPATIENT_CLINIC_OR_DEPARTMENT_OTHER): Payer: Self-pay | Admitting: *Deleted

## 2016-08-04 ENCOUNTER — Emergency Department (HOSPITAL_BASED_OUTPATIENT_CLINIC_OR_DEPARTMENT_OTHER)
Admission: EM | Admit: 2016-08-04 | Discharge: 2016-08-05 | Disposition: A | Discharge status: Home / Self Care | Payer: Medicare Other | Attending: Emergency Medicine | Admitting: Emergency Medicine

## 2016-08-04 DIAGNOSIS — N390 Urinary tract infection, site not specified: Secondary | ICD-10-CM | POA: Diagnosis not present

## 2016-08-04 DIAGNOSIS — J449 Chronic obstructive pulmonary disease, unspecified: Secondary | ICD-10-CM | POA: Insufficient documentation

## 2016-08-04 DIAGNOSIS — E119 Type 2 diabetes mellitus without complications: Secondary | ICD-10-CM | POA: Diagnosis not present

## 2016-08-04 DIAGNOSIS — R319 Hematuria, unspecified: Secondary | ICD-10-CM

## 2016-08-04 DIAGNOSIS — R1011 Right upper quadrant pain: Secondary | ICD-10-CM | POA: Diagnosis present

## 2016-08-04 LAB — URINALYSIS, ROUTINE W REFLEX MICROSCOPIC
Bilirubin Urine: NEGATIVE
GLUCOSE, UA: NEGATIVE mg/dL
Ketones, ur: NEGATIVE mg/dL
Nitrite: NEGATIVE
PH: 5 (ref 5.0–8.0)
PROTEIN: NEGATIVE mg/dL
Specific Gravity, Urine: 1.018 (ref 1.005–1.030)

## 2016-08-04 LAB — CBC WITH DIFFERENTIAL/PLATELET
BASOS ABS: 0 10*3/uL (ref 0.0–0.1)
BASOS PCT: 0 %
Eosinophils Absolute: 0.1 10*3/uL (ref 0.0–0.7)
Eosinophils Relative: 1 %
HEMATOCRIT: 38.4 % (ref 36.0–46.0)
HEMOGLOBIN: 12.8 g/dL (ref 12.0–15.0)
Lymphocytes Relative: 42 %
Lymphs Abs: 1.9 10*3/uL (ref 0.7–4.0)
MCH: 31.3 pg (ref 26.0–34.0)
MCHC: 33.3 g/dL (ref 30.0–36.0)
MCV: 93.9 fL (ref 78.0–100.0)
MONO ABS: 0.5 10*3/uL (ref 0.1–1.0)
Monocytes Relative: 12 %
NEUTROS ABS: 2 10*3/uL (ref 1.7–7.7)
NEUTROS PCT: 45 %
Platelets: 243 10*3/uL (ref 150–400)
RBC: 4.09 MIL/uL (ref 3.87–5.11)
RDW: 11.9 % (ref 11.5–15.5)
WBC: 4.4 10*3/uL (ref 4.0–10.5)

## 2016-08-04 LAB — URINE MICROSCOPIC-ADD ON

## 2016-08-04 LAB — COMPREHENSIVE METABOLIC PANEL
ALT: 21 U/L (ref 14–54)
ANION GAP: 7 (ref 5–15)
AST: 25 U/L (ref 15–41)
Albumin: 4.3 g/dL (ref 3.5–5.0)
Alkaline Phosphatase: 83 U/L (ref 38–126)
BILIRUBIN TOTAL: 0.4 mg/dL (ref 0.3–1.2)
BUN: 9 mg/dL (ref 6–20)
CO2: 28 mmol/L (ref 22–32)
Calcium: 9.4 mg/dL (ref 8.9–10.3)
Chloride: 104 mmol/L (ref 101–111)
Creatinine, Ser: 0.75 mg/dL (ref 0.44–1.00)
GFR calc Af Amer: >60 mL/min (ref >60)
Glucose, Bld: 109 mg/dL — ABNORMAL HIGH (ref 65–99)
POTASSIUM: 3.5 mmol/L (ref 3.5–5.1)
Sodium: 139 mmol/L (ref 135–145)
TOTAL PROTEIN: 7.3 g/dL (ref 6.5–8.1)

## 2016-08-04 LAB — LIPASE, BLOOD: LIPASE: 20 U/L (ref 11–51)

## 2016-08-04 MED ORDER — ONDANSETRON HCL 4 MG/2ML IJ SOLN
4.0000 mg | Freq: Once | INTRAMUSCULAR | Status: AC
  Administered 2016-08-04: 4 mg via INTRAVENOUS
  Filled 2016-08-04: qty 2

## 2016-08-04 MED ORDER — GI COCKTAIL ~~LOC~~
30.0000 mL | Freq: Once | ORAL | Status: AC
  Administered 2016-08-04: 30 mL via ORAL
  Filled 2016-08-04: qty 30

## 2016-08-04 MED ORDER — FENTANYL CITRATE (PF) 100 MCG/2ML IJ SOLN
50.0000 ug | Freq: Once | INTRAMUSCULAR | Status: AC
  Administered 2016-08-05: 50 ug via INTRAVENOUS
  Filled 2016-08-04: qty 2

## 2016-08-04 MED ORDER — METOCLOPRAMIDE HCL 5 MG/ML IJ SOLN
10.0000 mg | Freq: Once | INTRAMUSCULAR | Status: AC
  Administered 2016-08-05: 10 mg via INTRAVENOUS
  Filled 2016-08-04: qty 2

## 2016-08-04 MED ORDER — KETOROLAC TROMETHAMINE 30 MG/ML IJ SOLN
30.0000 mg | Freq: Once | INTRAMUSCULAR | Status: AC
  Administered 2016-08-04: 30 mg via INTRAVENOUS
  Filled 2016-08-04: qty 1

## 2016-08-04 NOTE — ED Notes (Signed)
Patient transported to Ultrasound 

## 2016-08-04 NOTE — ED Triage Notes (Signed)
Pt c/o right upper and lower abd pain with nausea  HX diverticulitis

## 2016-08-04 NOTE — ED Provider Notes (Signed)
[facility] Provider Note   CSN: [id number] Arrival date & time: 08/04/16  2116  By signing my name below, I, Holly Rogers, attest that this documentation has been prepared under the direction and in the presence of Holly Zoltan Genest PA-C.  Electronically Signed: Vista Minkobert Rogers, ED Scribe. 08/04/16. 10:37 PM.  History   Chief Complaint Chief Complaint  Patient presents with  . Abdominal Pain    HPI HPI Comments: Holly MuscaRobin Kirchoff is a 55 y.o. female with Hx of Diverticulitis, DDD, DM, Sarcoidosis, who presents to the Emergency Department complaining of right upper and lower abdominal pain and right flank pain that started four days ago.  Her symptoms started as pain in her RLQ. Pt is prescribed Percocet 5's for pain and has taken this with no relief of her pain. Pt reports associated nausea and took Phenergan today with no relief. She states, "every time I eat it feels like my stomach is about to blow up". Her last bowel movement was 2 days ago. Pt states that she has had diverticulitis intermittently over the past year. States this does not feel like diverticulitis. She has ahistory of chronic abdominal pain. States she has also had urinary frequency. She was seen by her PCP on four days ago and had a UA done which was normal. No fever, vomiting or diarrhea.  The history is provided by the patient. No language interpreter was used.    Past Medical History:  Diagnosis Date  . Acid reflux   . Arthritis   . COPD (chronic obstructive pulmonary disease) (HCC)   . COPD (chronic obstructive pulmonary disease) (HCC)   . DDD (degenerative disc disease), lumbar   . DDD (degenerative disc disease), lumbar   . Diabetes mellitus without complication (HCC)   . Diverticulitis   . Diverticulosis   . Fibromyalgia   . Mitral valve prolapse   . Pancreatitis 1997  . Pneumonia   . Sarcoidosis (HCC)   . Sepsis (HCC)   . Sepsis [facility](HCC)     Patient Active Problem List   Diagnosis Date Noted  . Acute  sinusitis 07/24/2016  . Diverticulitis of intestine with abscess 10/18/2015  . Chest wall pain, chronic 08/23/2015  . Mild persistent asthma with exacerbation 06/15/2015  . Other allergic rhinitis 06/15/2015  . Sepsis (HCC) 03/05/2014  . CAP (community acquired pneumonia) 03/05/2014  . Sarcoid (HCC) 03/05/2014    Past Surgical History:  Procedure Laterality Date  . ABDOMINAL HYSTERECTOMY    . CHOLECYSTECTOMY      OB History    No data available       Home Medications    Prior to Admission medications   Medication Sig Start Date End Date Taking? Authorizing Provider  acetaminophen (TYLENOL) 650 MG CR tablet Take 650 mg by mouth.    Historical Provider, MD  albuterol (PROVENTIL HFA;VENTOLIN HFA) 108 (90 Base) MCG/ACT inhaler Inhale into the lungs.    Historical Provider, MD  albuterol (PROVENTIL) (2.5 MG/3ML) 0.083% nebulizer solution USE 1 VIAL EVERY 6 HOURS AS NEEDED FOR WHEEZING 11/14/14   Historical Provider, MD  azelastine (ASTELIN) 0.1 % nasal spray Take 1-2 sprays per nostril 2 times daily as needed. 07/24/16   Cristal Fordalph Carter Bobbitt, MD  beclomethasone (QVAR) 40 MCG/ACT inhaler Inhale 2 puffs into the lungs 2 (two) times daily. 07/24/16   Cristal Fordalph Carter Bobbitt, MD  benzonatate (TESSALON) 100 MG capsule Take by mouth. 09/20/15 09/19/16  Historical Provider, MD  Cyanocobalamin (B-12 PO) Take 1 tablet by mouth daily.  Historical Provider, MD  diclofenac sodium (VOLTAREN) 1 % GEL Apply 1 application topically 3 (three) times daily as needed (for pain).  01/18/14   Historical Provider, MD  dicyclomine (BENTYL) 10 MG capsule Take 10 mg by mouth 4 (four) times daily -  before meals and at bedtime. Reported on 10/17/2015    Historical Provider, MD  fexofenadine (ALLEGRA) 180 MG tablet Take 180 mg by mouth daily.    Historical Provider, MD  fluticasone (FLONASE) 50 MCG/ACT nasal spray Place 2 sprays into both nostrils daily. 01/30/16   Mikki Santee, MD  glucose blood (ONETOUCH VERIO) test strip  USE 1 STRIP TO SKIN TWICE DAILY.  DX CODE: E11.9 03/28/15   Historical Provider, MD  HYDROcodone-acetaminophen (NORCO/VICODIN) 5-325 MG tablet Take 1 tablet by mouth 2 (two) times daily. Reported on 01/30/2016    Historical Provider, MD  hydrocortisone 2.5 % cream Apply 1 application topically 2 (two) times daily. Reported on 01/30/2016    Historical Provider, MD  hydroxypropyl methylcellulose (ISOPTO TEARS) 2.5 % ophthalmic solution 1 drop. 12/14/14   Historical Provider, MD  ibuprofen (ADVIL,MOTRIN) 200 MG tablet Take 200 mg by mouth every 6 (six) hours as needed.    Historical Provider, MD  levalbuterol [facility] HFA) 45 MCG/ACT inhaler Inhale 2 puffs into the lungs every 4 (four) hours as needed for wheezing. 07/04/16   Fletcher Anon, MD  levalbuterol (XOPENEX) 1.25 MG/3ML nebulizer solution Take 1.25 mg by nebulization every 6 (six) hours as needed for wheezing or shortness of breath. 07/04/16   Fletcher Anon, MD  meloxicam (MOBIC) 15 MG tablet  06/19/16   Historical Provider, MD  montelukast (SINGULAIR) 10 MG tablet Take 10 mg by mouth at bedtime. 12/02/13   Historical Provider, MD  Multiple Vitamin (MULTI-VITAMINS) TABS Take by mouth.    Historical Provider, MD  ondansetron (ZOFRAN-ODT) 8 MG disintegrating tablet Take by mouth. 07/11/15   Historical Provider, MD  oxyCODONE-acetaminophen (PERCOCET) 10-325 MG tablet TK ONE T PO Q 4 H PRN P 10/24/15   Historical Provider, MD  polyethylene glycol powder (GLYCOLAX/MIRALAX) powder Take by mouth. 12/25/14   Historical Provider, MD  polyvinyl alcohol (LIQUIFILM TEARS) 1.4 % ophthalmic solution Reported on 01/30/2016    Historical Provider, MD  potassium chloride SA (K-DUR,KLOR-CON) 20 MEQ tablet Take 20 mEq by mouth daily. 01/17/14   Historical Provider, MD  prednisoLONE (PRELONE) 15 MG/5ML SOLN One teaspoonful by mouth twice daily for 5 days,then 1 teaspoonful day 6, then 1/2 teaspoonful on day 7, then stop. 07/24/16   Cristal Ford, MD  promethazine  (PHENERGAN) 25 MG tablet Take 1 tablet (25 mg total) by mouth every 6 (six) hours as needed for nausea or vomiting. 03/06/15   Gwyneth Sprout, MD  senna (SENOKOT) 8.6 MG tablet Take by mouth.    Historical Provider, MD  sucralfate (CARAFATE) 1 GM/10ML suspension Take 10 mLs (1 g total) by mouth 4 (four) times daily -  with meals and at bedtime. 01/30/16   April Palumbo, MD  triamcinolone (NASACORT ALLERGY 24HR) 55 MCG/ACT AERO nasal inhaler Place 2 sprays into the nose daily as needed (for stuffy nose). Reported on 01/30/2016    Historical Provider, MD  triamcinolone cream (KENALOG) 0.1 % Apply topically. 07/20/15   Historical Provider, MD    Family History Family History  Problem Relation Age of Onset  . Asthma Mother   . COPD Mother     smoked  . Rheum arthritis Mother   . Skin cancer Mother  Social History Social History  Substance Use Topics  . Smoking status: Never Smoker  . Smokeless tobacco: Never Used  . Alcohol use No     Allergies   Avandia [rosiglitazone]; Flagyl [metronidazole]; Metformin; Tramadol hcl; Valdecoxib; Vancomycin; Amoxicillin; Avelox [moxifloxacin hcl in nacl]; Baclofen; Cefdinir; Ceftin [cefuroxime axetil]; Celebrex [celecoxib]; Ciprofloxacin; Clindamycin; Clindamycin/lincomycin; Doxycycline; Erythromycin base; Latex; Levaquin [levofloxacin in d5w]; Levofloxacin; Macrobid [nitrofurantoin monohyd macro]; Macrodantin [nitrofurantoin macrocrystal]; Metformin and related; Neurontin [gabapentin]; Nitrofurantoin; Other; Propoxyphene; Sulfa antibiotics; Symbicort [budesonide-formoterol fumarate]; Tramadol hcl; Ultram [tramadol]; Vioxx [rofecoxib]; Voltaren [diclofenac sodium]; Ertapenem; Sulfacetamide sodium; and Tape   Review of Systems Review of Systems 10 Systems reviewed and all are negative for acute change except as noted in the HPI.  Physical Exam Updated Vital Signs BP 124/81 (BP Location: Left Arm)   Pulse 100   Temp 98.1 F (36.7 C) (Oral)   Resp  18   Ht 5\' 4"  (1.626 m)   Wt 174 lb (78.9 kg)   SpO2 96%   BMI 29.87 kg/m   Physical Exam  Constitutional: She is oriented to person, place, and time.  Chronically ill appearing  HENT:  Right Ear: External ear normal.  Left Ear: External ear normal.  Nose: Nose normal.  Mouth/Throat: Oropharynx is clear and moist. No oropharyngeal exudate.  Eyes: Conjunctivae are normal.  Neck: Neck supple.  Cardiovascular: Normal rate, regular rhythm, normal heart sounds and intact distal pulses.   Pulmonary/Chest: Effort normal and breath sounds normal. No respiratory distress. She has no wheezes.  Abdominal: Soft. Bowel sounds are normal. She exhibits no distension. There is no rebound and no guarding.  Diffuse abdominal tenderness without rebound or guarding. With distraction pt is nontender. She does have some right CVA tenderness  Musculoskeletal: She exhibits no edema.  Lymphadenopathy:    She has no cervical adenopathy.  Neurological: She is alert and oriented to person, place, and time. No cranial nerve deficit.  Skin: Skin is warm and dry.  Psychiatric: She has a normal mood and affect.  Nursing note and vitals reviewed.    ED Treatments / Results  DIAGNOSTIC STUDIES: Oxygen Saturation is 96% on RA, normal by my interpretation.  COORDINATION OF CARE: 10:33 PM-Will order Toradol. Discussed treatment plan with pt at bedside and pt agreed to plan.   Labs (all labs ordered are listed, but only abnormal results are displayed) Labs Reviewed  URINALYSIS, ROUTINE W REFLEX MICROSCOPIC (NOT [facility]) - Abnormal; Notable for the following:       Result Value   APPearance CLOUDY (*)    Hgb urine dipstick TRACE (*)    Leukocytes, UA LARGE (*)    All other components within normal limits  URINE MICROSCOPIC-ADD ON - Abnormal; Notable for the following:    Squamous Epithelial / LPF 6-30 (*)    Bacteria, UA MANY (*)    All other components within normal limits  COMPREHENSIVE METABOLIC PANEL  - Abnormal; Notable for the following:    Glucose, Bld 109 (*)    All other components within normal limits  URINE CULTURE  LIPASE, BLOOD  CBC WITH DIFFERENTIAL/PLATELET    EKG  EKG Interpretation None       Radiology [place] Renal  Result Date: 08/04/2016 CLINICAL DATA:  Right flank pain and low back pain for 1 week. Dysuria for 24 hours. Urinary tract infection today. EXAM: RENAL / URINARY TRACT ULTRASOUND COMPLETE COMPARISON:  CT abdomen and pelvis 04/02/2016. FINDINGS: Right Kidney: Length: 11.1 cm. Echogenicity within normal limits. No mass  or hydronephrosis visualized. Mild prominent extrarenal pelvis. Left Kidney: Length: 11.4 cm. Echogenicity within normal limits. No mass or hydronephrosis visualized. Bladder: No bladder wall thickening or filling defects. Color flow Doppler images demonstrate bilateral urine flow jets. IMPRESSION: Normal ultrasound appearance of the kidneys and bladder. No hydronephrosis. Electronically Signed   By: Burman Nieves M.D.   On: 08/04/2016 23:31    Procedures Procedures (including critical care time)  Medications Ordered in ED Medications - No data to display   Initial Impression / Assessment and Plan / ED Course  I have reviewed the triage vital signs and the nursing notes.  Pertinent labs & imaging results that were available during my care of the patient were reviewed by me and considered in my medical decision making (see chart for details).  Clinical Course     UA with evidence of infection. CBC, CMP, and lipase negative. Pt had a CT abd/pelvis in July of this year for evaluation of RLQ and right flank pain that was negative. Given recent CT scan with chronic abdominal pain and inconsistent exam will obtain renal US to eval for Lugar. However my suspicion for appendicitis, diverticulitis, SBO, or other acute intra-abdominal etiology is low.   Renal US negative. VSS and unremakable. Pt requesting another dose of pain medicine which I  have ordered. We discussed options and will defer CT scan at this time given history of so many scans and most recent had one in July 2017. Will treat UTI with a dose of rocephin now and keflex at home. Will send urine for culture. Encouraged f/u with PCP. Er return precautions given.  Final Clinical Impressions(s) / ED Diagnoses   Final diagnoses:  Urinary tract infection with hematuria, site unspecified    New Prescriptions New Prescriptions   CEPHALEXIN (KEFLEX) 500 MG CAPSULE    Take 1 capsule (500 mg total) by mouth 3 (three) times daily.   METOCLOPRAMIDE (REGLAN) 10 MG TABLET    Take 1 tablet (10 mg total) by mouth every 6 (six) hours as needed for nausea.   I personally performed the services described in this documentation, which was scribed in my presence. The recorded information has been reviewed and is accurate.    Carlene Coria, PA-C 08/05/16 0100    Arby Barrette, MD 08/07/16 [phone]

## 2016-08-05 DIAGNOSIS — N390 Urinary tract infection, site not specified: Secondary | ICD-10-CM | POA: Diagnosis not present

## 2016-08-05 MED ORDER — CEPHALEXIN 500 MG PO CAPS: 500.0000 mg | ORAL_CAPSULE | Freq: Three times a day (TID) | ORAL | 0 refills | Status: DC

## 2016-08-05 MED ORDER — DEXTROSE 5 % IV SOLN
1.0000 g | Freq: Once | INTRAVENOUS | Status: AC
  Administered 2016-08-05: 1 g via INTRAVENOUS
  Filled 2016-08-05: qty 10

## 2016-08-05 MED ORDER — METOCLOPRAMIDE HCL 10 MG PO TABS: 10.0000 mg | ORAL_TABLET | Freq: Four times a day (QID) | ORAL | 0 refills | Status: DC | PRN

## 2016-08-05 NOTE — Discharge Instructions (Signed)
Take antibiotics as prescribed for your urinary tract infection. I also gave you a prescription for Reglan to take as needed for nausea. You may take the pain medicine you have at home as needed for pain. Follow up with your primary care provider. Return to the ER for new or worsening symptoms.

## 2016-08-06 LAB — URINE CULTURE: Culture: NO GROWTH

## 2016-11-11 DIAGNOSIS — Z202 Contact with and (suspected) exposure to infections with a predominantly sexual mode of transmission: Secondary | ICD-10-CM | POA: Insufficient documentation

## 2017-08-03 ENCOUNTER — Ambulatory Visit (INDEPENDENT_AMBULATORY_CARE_PROVIDER_SITE_OTHER): Payer: Medicare Other | Admitting: Family Medicine

## 2017-08-03 ENCOUNTER — Encounter: Payer: Self-pay | Admitting: Family Medicine

## 2017-08-03 VITALS — BP 108/70 | HR 92 | Temp 98.1°F | Resp 20

## 2017-08-03 DIAGNOSIS — J4530 Mild persistent asthma, uncomplicated: Secondary | ICD-10-CM

## 2017-08-03 DIAGNOSIS — K219 Gastro-esophageal reflux disease without esophagitis: Secondary | ICD-10-CM | POA: Diagnosis not present

## 2017-08-03 DIAGNOSIS — J3089 Other allergic rhinitis: Secondary | ICD-10-CM

## 2017-08-03 DIAGNOSIS — D869 Sarcoidosis, unspecified: Secondary | ICD-10-CM

## 2017-08-03 NOTE — Progress Notes (Signed)
Follow-up Note  RE: Holly MuscaRobin Kanady MRN: [id number] DOB: 1961/07/30 Date of Office Visit: 08/03/2017  Primary care provider: Idelia SalmMoogali M. Kathrynn SpeedArvind,  MD Referring provider: Idelia SalmMoogali M. Arvind,  MD  History of present illness: Holly Rogers is a 56 year old female with a history of asthma, allergic rhinitis, and sarcoid presenting to the clinic today for a sick visit.  She was last seen in this office on 07/24/2016 by Dr. Nunzio CobbsBobbitt.  At that time she was experiencing sinus pressure, increased chest tightness, coughing, and wheezing for which she was treated with a course of prednisolone.   At today's visit, her primary concern is pain and tightness in her left chest. She reports going to her primary care doctor on July 23, 2017 with pain and tightness in her chest that started gradually in a small area in the center of her chest.  Additional symptoms included burning, watering eyes and cough with some white sputum production.  She received azithromycin and a Decadron injection which provided partial resolution for a few days.  She reports on Tuesday, November 13, her left lung felt like it was burning and "on fire".  She returned to her primary care provider on Friday, November 16 and received a second round of azithromycin and a second Decadron injection.  She currently denies fever, sweats, chills, diarrhea shortness of breath, wheezing, nighttime awakenings, and sick contacts.  She has used her Xopenex inhaler 2 times yesterday with some relief.  She has not used any inhalers prior to yesterday.  She is taking Gaviscon as needed for heartburn which averages about once a week.  She reports an upcoming appointment with GI specialists for a esophageal dilation procedure due to recent dysphagia.  She reports remote history of breast implants which were removed in 2014 due to chest pain.  She did see a pulmonary specialist for her sarcoid about 1 year ago and reports she plans to return soon for follow up.    Assessment and plan:  1. Mild persistent asthma The etiology of Aaron's chest symptoms is unclear. A similar symptom constellation in the past has been relieved with prednisone.   - Begin prednisone, take 10 mg twice a day for four days, then take 10 mg once on the last day. Begin taking prednisone on Wednesday after your GI procedure. - Continue using Xopenex, 2 puffs every 4 hours as needed for cough or shortness of breath. - During respiratory tract infections or asthma flares begin QVAR 40, ihnale 2 puffs twice a day for 1-2 weeks until you are back at baseline with your breathing. - Begin using a spacer with all of your inhalers. - Continue Singulair 10 mg daily.  2. Sarcoid - Follow up with pulmonology  3. Other allergic rhinitis - Continue Nasocort 2 sprays in each nostril daily as needed - Continue Allegra 180 mg daily as needed  4. Gastroesophageal Reflex - Continue Gaviscon daily as needed to control reflux - Keep appointment of Wednesday with GI specialists - Patient refused omeprazole therapy 5.  Follow up in 3 months or sooner if needed   Diagnostics Spirometry is in the normal range. FEV1: 2.71, FVC: 3.39.  Predicted FEV1: 2.70, predicted FVC: 3.45.   Physical examination: Blood pressure 108/70, pulse 92, temperature 98.1 F (36.7 C), temperature source Oral, resp. rate 20, SpO2 97 %.  General: Alert, interactive, in no acute distress. HEENT: TMs pearly gray, turbinates non-edematous without discharge, post-pharynx unremarkable. Neck: Supple without lymphadenopathy. Lungs: Clear to auscultation without wheezing, rhonchi  or rales. CV: Normal S1, S2 without murmurs. Skin: Warm and dry, without lesions or rashes.   The following portions of the patient's history were reviewed and updated as appropriate: Allergies, current medications, past family history, past medical history, past social history, past surgical history, and problem list.  Allergies as of  08/03/2017      Reactions   Avandia [rosiglitazone] Other (See Comments)   Heart heart Chest pain   Flagyl [metronidazole] Nausea And Vomiting, Other (See Comments)   Severe stomach pain   Metformin Other (See Comments)   Chest pain   Tramadol Hcl Other (See Comments)   Valdecoxib Other (See Comments)   Unknown Unknown   Vancomycin Nausea And Vomiting   Amoxicillin Nausea Only   Avelox [moxifloxacin Hcl In Nacl]    Patient is unsure what happens    Baclofen Nausea Only   Unknown, patient knows she couldn't take it   Cefdinir Other (See Comments)   No reaction listed. Ask pt and enter   Ceftin [cefuroxime Axetil]    unknown   Celebrex [celecoxib]    Chest pain   Ciprofloxacin Nausea And Vomiting, Diarrhea   Clindamycin Other (See Comments)   Cannot remember   Clindamycin/lincomycin    unknown   Doxycycline    unknown   Erythromycin Base Other (See Comments)   Ask pt and enter   Latex Other (See Comments)   Ask pt and enter   Levaquin [levofloxacin In D5w]    unknown   Levofloxacin Other (See Comments)   Macrobid [nitrofurantoin Monohyd Macro]    unknown   Macrodantin [nitrofurantoin Macrocrystal]    Metformin And Related    Chest pain   Neurontin [gabapentin]    Gave patient problems   Nitrofurantoin    unknown   Other Nausea And Vomiting   Darvocet   Propoxyphene Nausea And Vomiting   Sulfa Antibiotics Nausea And Vomiting   Symbicort [budesonide-formoterol Fumarate]    Shakes   Tramadol Hcl    Ultram [tramadol] Other (See Comments)   Hallucination unknown   Vioxx [rofecoxib] Other (See Comments)   Heart unknown   Voltaren [diclofenac Sodium] Diarrhea   Unknown. But she can use the gel   Ertapenem Dermatitis   Sulfacetamide Sodium Nausea And Vomiting   Tape Rash   Can use paper tape      Medication List        Accurate as of 08/03/17  3:55 PM. Always use your most recent med list.          azelastine 0.1 % nasal spray Commonly known as:   ASTELIN Take 1-2 sprays per nostril 2 times daily as needed.   azithromycin 250 MG tablet Commonly known as:  ZITHROMAX   beclomethasone 40 MCG/ACT inhaler Commonly known as:  QVAR Inhale 2 puffs into the lungs 2 (two) times daily.   BELSOMRA 15 MG Tabs Generic drug:  Suvorexant   cetirizine 10 MG tablet Commonly known as:  ZYRTEC Take 10 mg by mouth.   cyclobenzaprine 10 MG tablet Commonly known as:  FLEXERIL Take 10 mg by mouth.   diclofenac sodium 1 % Gel Commonly known as:  VOLTAREN Apply 1 application topically 3 (three) times daily as needed (for pain).   fexofenadine 180 MG tablet Commonly known as:  ALLEGRA Take 180 mg by mouth daily.   fluticasone 50 MCG/ACT nasal spray Commonly known as:  FLONASE Place 2 sprays into both nostrils daily.   hydroxypropyl methylcellulose 2.5 % ophthalmic solution Commonly  known as:  ISOPTO TEARS 1 drop.   levalbuterol 1.25 MG/3ML nebulizer solution Commonly known as:  XOPENEX Take 1.25 mg by nebulization every 6 (six) hours as needed for wheezing or shortness of breath.   levalbuterol 45 MCG/ACT inhaler Commonly known as:  XOPENEX HFA Inhale 2 puffs into the lungs every 4 (four) hours as needed for wheezing.   meloxicam 15 MG tablet Commonly known as:  MOBIC   montelukast 10 MG tablet Commonly known as:  SINGULAIR Take 10 mg by mouth at bedtime.   MULTI-VITAMINS Tabs Take by mouth.   NASACORT ALLERGY 24HR 55 MCG/ACT Aero nasal inhaler Generic drug:  triamcinolone Place 2 sprays into the nose daily as needed (for stuffy nose). Reported on 01/30/2016   ondansetron 8 MG disintegrating tablet Commonly known as:  ZOFRAN-ODT Take by mouth.   oxyCODONE-acetaminophen 5-325 MG tablet Commonly known as:  PERCOCET/ROXICET Take by mouth.   polyethylene glycol powder powder Commonly known as:  GLYCOLAX/MIRALAX Take by mouth.   potassium chloride SA 20 MEQ tablet Commonly known as:  K-DUR,KLOR-CON Take 20 mEq by mouth  daily.   promethazine 25 MG tablet Commonly known as:  PHENERGAN Take 1 tablet (25 mg total) by mouth every 6 (six) hours as needed for nausea or vomiting.   sucralfate 1 GM/10ML suspension Commonly known as:  CARAFATE Take 10 mLs (1 g total) by mouth 4 (four) times daily -  with meals and at bedtime.   triamcinolone cream 0.1 % Commonly known as:  KENALOG Apply topically.       Allergies  Allergen Reactions  . Avandia [Rosiglitazone] Other (See Comments)    Heart heart Chest pain  . Flagyl [Metronidazole] Nausea And Vomiting and Other (See Comments)    Severe stomach pain  . Metformin Other (See Comments)    Chest pain  . Tramadol Hcl Other (See Comments)  . Valdecoxib Other (See Comments)    Unknown Unknown  . Vancomycin Nausea And Vomiting  . Amoxicillin Nausea Only  . Avelox [Moxifloxacin Hcl In Nacl]     Patient is unsure what happens   . Baclofen Nausea Only    Unknown, patient knows she couldn't take it  . Cefdinir Other (See Comments)    No reaction listed. Ask pt and enter  . Ceftin [Cefuroxime Axetil]     unknown  . Celebrex [Celecoxib]     Chest pain  . Ciprofloxacin Nausea And Vomiting and Diarrhea  . Clindamycin Other (See Comments)    Cannot remember  . Clindamycin/Lincomycin     unknown  . Doxycycline     unknown  . Erythromycin Base Other (See Comments)    Ask pt and enter  . Latex Other (See Comments)    Ask pt and enter  . Levaquin [Levofloxacin In D5w]     unknown  . Levofloxacin Other (See Comments)  . Macrobid [employer] Macro]     unknown  . Macrodantin [Nitrofurantoin Macrocrystal]   . Metformin And Related     Chest pain  . Neurontin [Gabapentin]     Gave patient problems  . Nitrofurantoin     unknown  . Other Nausea And Vomiting    Darvocet  . Propoxyphene Nausea And Vomiting  . Sulfa Antibiotics Nausea And Vomiting  . Symbicort [Budesonide-Formoterol Fumarate]     Shakes  . Tramadol Hcl   . Ultram  [Tramadol] Other (See Comments)    Hallucination unknown  . Vioxx [Rofecoxib] Other (See Comments)    Heart unknown  .  Voltaren [Diclofenac Sodium] Diarrhea    Unknown. But she can use the gel  . Ertapenem Dermatitis  . Sulfacetamide Sodium Nausea And Vomiting  . Tape Rash    Can use paper tape    I appreciate the opportunity to take part in Tuleen's care. Please do not hesitate to contact me with questions.  Sincerely,   Thermon LeylandAnne Xzayvier Fagin, FNP  I have provided oversight concerning Thurston Holenne Amb's evaluation and treatment of this patient's health issues addressed during today's encounter.  I agree with the assessment and therapeutic plan as outlined in the note.  Signed,  R Jorene Guestarter Bobbitt, MD

## 2017-08-03 NOTE — Patient Instructions (Addendum)
1. Mild persistent asthma, uncomplicated - Begin prednisone, take 10 mg twice a day for four days, then take 10 mg once on the last day. Begin taking prednisone on Wednesday after your GI procedure. - Continue using Xopenex, 2 puffs every 4 hours as needed for cough or shortness of breath. - During respiratory tract infections or asthma flares begin QVAR 40, ihnale 2 puffs twice a day for 1-2 weeks until you are back at baseline with your breathing. - Begin using a spacer with all of your inhalers. - Continue Singulair 10 mg daily.  2. Sarcoid - Follow up with pulmonology  3. Other allergic rhinitis - Continue Nasocort 2 sprays in each nostril daily as needed - Continue Allegra 180 mg daily as needed  4. Gastroesophageal Reflex - Continue Gaviscon daily as needed to control reflux - Keep appointment of Wednesday with GI specialists  5.  Follow up in 3 months or sooner if needed

## 2017-11-11 ENCOUNTER — Ambulatory Visit: Payer: Medicare Other | Admitting: Allergy and Immunology

## 2017-12-01 ENCOUNTER — Ambulatory Visit: Payer: Medicare Other | Admitting: Pediatrics

## 2017-12-08 ENCOUNTER — Encounter (HOSPITAL_BASED_OUTPATIENT_CLINIC_OR_DEPARTMENT_OTHER): Payer: Self-pay | Admitting: *Deleted

## 2017-12-08 ENCOUNTER — Other Ambulatory Visit: Payer: Self-pay

## 2017-12-08 DIAGNOSIS — Z91040 Latex allergy status: Secondary | ICD-10-CM | POA: Insufficient documentation

## 2017-12-08 DIAGNOSIS — E119 Type 2 diabetes mellitus without complications: Secondary | ICD-10-CM | POA: Insufficient documentation

## 2017-12-08 DIAGNOSIS — J45909 Unspecified asthma, uncomplicated: Secondary | ICD-10-CM | POA: Insufficient documentation

## 2017-12-08 DIAGNOSIS — N3000 Acute cystitis without hematuria: Secondary | ICD-10-CM | POA: Insufficient documentation

## 2017-12-08 DIAGNOSIS — Z79899 Other long term (current) drug therapy: Secondary | ICD-10-CM | POA: Insufficient documentation

## 2017-12-08 DIAGNOSIS — R109 Unspecified abdominal pain: Secondary | ICD-10-CM | POA: Diagnosis present

## 2017-12-08 DIAGNOSIS — J449 Chronic obstructive pulmonary disease, unspecified: Secondary | ICD-10-CM | POA: Insufficient documentation

## 2017-12-08 LAB — URINALYSIS, MICROSCOPIC (REFLEX)

## 2017-12-08 LAB — URINALYSIS, ROUTINE W REFLEX MICROSCOPIC
Bilirubin Urine: NEGATIVE
Glucose, UA: NEGATIVE mg/dL
Ketones, ur: NEGATIVE mg/dL
Leukocytes, UA: NEGATIVE
Nitrite: NEGATIVE
PH: 5.5 (ref 5.0–8.0)
Protein, ur: NEGATIVE mg/dL

## 2017-12-08 NOTE — ED Triage Notes (Signed)
Abdominal pain x 2 days when she eats. Diarrhea since 3/14. She has not told her MD. Nausea, no vomiting.

## 2017-12-09 ENCOUNTER — Emergency Department (HOSPITAL_BASED_OUTPATIENT_CLINIC_OR_DEPARTMENT_OTHER): Payer: Medicare Other

## 2017-12-09 ENCOUNTER — Emergency Department (HOSPITAL_BASED_OUTPATIENT_CLINIC_OR_DEPARTMENT_OTHER)
Admission: EM | Admit: 2017-12-09 | Discharge: 2017-12-09 | Disposition: A | Discharge status: Home / Self Care | Payer: Medicare Other | Attending: Emergency Medicine | Admitting: Emergency Medicine

## 2017-12-09 ENCOUNTER — Encounter (HOSPITAL_BASED_OUTPATIENT_CLINIC_OR_DEPARTMENT_OTHER): Payer: Self-pay | Admitting: Emergency Medicine

## 2017-12-09 DIAGNOSIS — N3000 Acute cystitis without hematuria: Secondary | ICD-10-CM

## 2017-12-09 LAB — COMPREHENSIVE METABOLIC PANEL
ALT: 43 U/L (ref 14–54)
ANION GAP: 8 (ref 5–15)
AST: 41 U/L (ref 15–41)
Albumin: 4.1 g/dL (ref 3.5–5.0)
Alkaline Phosphatase: 87 U/L (ref 38–126)
BUN: 8 mg/dL (ref 6–20)
CHLORIDE: 107 mmol/L (ref 101–111)
CO2: 22 mmol/L (ref 22–32)
CREATININE: 0.69 mg/dL (ref 0.44–1.00)
Calcium: 9.3 mg/dL (ref 8.9–10.3)
Glucose, Bld: 101 mg/dL — ABNORMAL HIGH (ref 65–99)
Potassium: 3.7 mmol/L (ref 3.5–5.1)
SODIUM: 137 mmol/L (ref 135–145)
Total Bilirubin: 0.7 mg/dL (ref 0.3–1.2)
Total Protein: 7 g/dL (ref 6.5–8.1)

## 2017-12-09 LAB — RAPID URINE DRUG SCREEN, HOSP PERFORMED
Amphetamines: NOT DETECTED
BENZODIAZEPINES: NOT DETECTED
Barbiturates: NOT DETECTED
Cocaine: NOT DETECTED
OPIATES: POSITIVE — AB
TETRAHYDROCANNABINOL: NOT DETECTED

## 2017-12-09 LAB — CBC
HCT: 37.7 % (ref 36.0–46.0)
HEMOGLOBIN: 12.8 g/dL (ref 12.0–15.0)
MCH: 31.3 pg (ref 26.0–34.0)
MCHC: 34 g/dL (ref 30.0–36.0)
MCV: 92.2 fL (ref 78.0–100.0)
PLATELETS: 226 10*3/uL (ref 150–400)
RBC: 4.09 MIL/uL (ref 3.87–5.11)
RDW: 11.8 % (ref 11.5–15.5)
WBC: 4.8 10*3/uL (ref 4.0–10.5)

## 2017-12-09 LAB — LIPASE, BLOOD: LIPASE: 29 U/L (ref 11–51)

## 2017-12-09 MED ORDER — PHENAZOPYRIDINE HCL 200 MG PO TABS: 200.0000 mg | ORAL_TABLET | Freq: Three times a day (TID) | ORAL | 0 refills | Status: DC

## 2017-12-09 MED ORDER — ONDANSETRON HCL 4 MG/2ML IJ SOLN
4.0000 mg | Freq: Once | INTRAMUSCULAR | Status: AC
  Administered 2017-12-09: 4 mg via INTRAVENOUS
  Filled 2017-12-09: qty 2

## 2017-12-09 MED ORDER — DICYCLOMINE HCL 10 MG/ML IM SOLN
20.0000 mg | Freq: Once | INTRAMUSCULAR | Status: AC
  Administered 2017-12-09: 20 mg via INTRAMUSCULAR
  Filled 2017-12-09: qty 2

## 2017-12-09 MED ORDER — FOSFOMYCIN TROMETHAMINE 3 G PO PACK
3.0000 g | PACK | Freq: Once | ORAL | Status: AC
  Administered 2017-12-09: 3 g via ORAL
  Filled 2017-12-09: qty 3

## 2017-12-09 NOTE — ED Notes (Signed)
Instructed to not eat or drink until seen by MD.

## 2017-12-09 NOTE — ED Provider Notes (Signed)
[facility] EMERGENCY DEPARTMENT Provider Note   CSN: [id number] Arrival date & time: 12/08/17  2227     History   Chief Complaint Chief Complaint  Patient presents with  . Abdominal Pain    HPI Holly Rogers is a 57 y.o. female.  The history is provided by the patient.  Abdominal Pain   This is a new problem. The current episode started more than 1 week ago. The problem occurs constantly. The problem has not changed since onset.The pain is associated with an unknown factor. Pertinent negatives include anorexia, fever, belching, flatus, hematochezia, melena, vomiting, constipation, dysuria and myalgias. Nothing aggravates the symptoms. Nothing relieves the symptoms. Past workup includes CT scan. Her past medical history does not include Crohn's disease.  Sees a doctor for Sarcoid treatment multiple times a week and thinks the steroids and antibiotics they are giving her are making her sick.  She has not told them about the pain though.  No f/c/r. No n/v.  No CP no SOB.  Her home narcotics are not touching the pain.  She is eating and sleeping normally.    Past Medical History:  Diagnosis Date  . Acid reflux   . Arthritis   . COPD (chronic obstructive pulmonary disease) (HCC)   . COPD (chronic obstructive pulmonary disease) (HCC)   . DDD (degenerative disc disease), lumbar   . DDD (degenerative disc disease), lumbar   . Diabetes mellitus without complication (HCC)   . Diverticulitis   . Diverticulosis   . Fibromyalgia   . Mitral valve prolapse   . Pancreatitis 1997  . Pneumonia   . Sarcoidosis   . Sepsis (HCC)   . Sepsis [facility])     Patient Active Problem List   Diagnosis Date Noted  . Mild persistent asthma, uncomplicated 08/03/2017  . Gastroesophageal reflux disease 08/03/2017  . Acute sinusitis 07/24/2016  . Diverticulitis of intestine with abscess 10/18/2015  . Chest wall pain, chronic 08/23/2015  . Mild persistent asthma with exacerbation 06/15/2015  .  Other allergic rhinitis 06/15/2015  . Sepsis (HCC) 03/05/2014  . CAP (community acquired pneumonia) 03/05/2014  . Sarcoid 03/05/2014    Past Surgical History:  Procedure Laterality Date  . ABDOMINAL HYSTERECTOMY    . CHOLECYSTECTOMY       OB History   None      Home Medications    Prior to Admission medications   Medication Sig Start Date End Date Taking? Authorizing Provider  azelastine (ASTELIN) 0.1 % nasal spray Take 1-2 sprays per nostril 2 times daily as needed. Patient not taking: Reported on 08/03/2017 07/24/16   Cristal Ford, MD  azithromycin [facility]) 250 MG tablet  07/31/17   [provider]  beclomethasone (QVAR) 40 MCG/ACT inhaler Inhale 2 puffs into the lungs 2 (two) times daily. Patient not taking: Reported on 08/03/2017 07/24/16   Bobbitt, Heywood Iles, MD  cetirizine (ZYRTEC) 10 MG tablet Take 10 mg by mouth.    [provider]  cyclobenzaprine (FLEXERIL) 10 MG tablet Take 10 mg by mouth.    [provider]  diclofenac sodium (VOLTAREN) 1 % GEL Apply 1 application topically 3 (three) times daily as needed (for pain).  01/18/14   [provider]  fexofenadine (ALLEGRA) 180 MG tablet Take 180 mg by mouth daily.    [provider]  fluticasone (FLONASE) 50 MCG/ACT nasal spray Place 2 sprays into both nostrils daily. Patient not taking: Reported on 08/03/2017 01/30/16   Mikki Santee, MD  hydroxypropyl methylcellulose (  ISOPTO TEARS) 2.5 % ophthalmic solution 1 drop. 12/14/14   [provider]  levalbuterol Pauline Aus(XOPENEX HFA) 45 MCG/ACT inhaler Inhale 2 puffs into the lungs every 4 (four) hours as needed for wheezing. 07/04/16   Fletcher AnonBardelas, Jose A, MD  levalbuterol (XOPENEX) 1.25 MG/3ML nebulizer solution Take 1.25 mg by nebulization every 6 (six) hours as needed for wheezing or shortness of breath. 07/04/16   Fletcher AnonBardelas, Jose A, MD  meloxicam (MOBIC) 15 MG tablet  06/19/16   [provider]  montelukast  (SINGULAIR) 10 MG tablet Take 10 mg by mouth at bedtime. 12/02/13   [provider]  Multiple Vitamin (MULTI-VITAMINS) TABS Take by mouth.    [provider]  ondansetron (ZOFRAN-ODT) 8 MG disintegrating tablet Take by mouth. 07/11/15   [provider]  oxyCODONE-acetaminophen (PERCOCET/ROXICET) 5-325 MG tablet Take by mouth.    [provider]  phenazopyridine (PYRIDIUM) 200 MG tablet Take 1 tablet (200 mg total) by mouth 3 (three) times daily. 12/09/17   Ralphie Lovelady, MD  polyethylene glycol powder (GLYCOLAX/MIRALAX) powder Take by mouth. 12/25/14   [provider]  potassium chloride SA (K-DUR,KLOR-CON) 20 MEQ tablet Take 20 mEq by mouth daily. 01/17/14   [provider]  promethazine (PHENERGAN) 25 MG tablet Take 1 tablet (25 mg total) by mouth every 6 (six) hours as needed for nausea or vomiting. 03/06/15   Gwyneth SproutPlunkett, Whitney, MD  sucralfate (CARAFATE) 1 GM/10ML suspension Take 10 mLs (1 g total) by mouth 4 (four) times daily -  with meals and at bedtime. 01/30/16   Angela Platner, MD  Suvorexant [facility](BELSOMRA) 15 MG TABS  07/01/17   [provider]  triamcinolone (NASACORT ALLERGY 24HR) 55 MCG/ACT AERO nasal inhaler Place 2 sprays into the nose daily as needed (for stuffy nose). Reported on 01/30/2016    [provider]  triamcinolone cream (KENALOG) 0.1 % Apply topically. 07/20/15   [provider]    Family History Family History  Problem Relation Age of Onset  . Asthma Mother   . COPD Mother        smoked  . Rheum arthritis Mother   . Skin cancer Mother     Social History Social History   Tobacco Use  . Smoking status: Never Smoker  . Smokeless tobacco: Never Used  Substance Use Topics  . Alcohol use: No    Alcohol/week: 0.0 oz  . Drug use: No     Allergies   Avandia [rosiglitazone]; Flagyl [metronidazole]; Metformin; Tramadol hcl; Valdecoxib; Vancomycin; Amoxicillin; Avelox [moxifloxacin hcl in nacl];  Baclofen; Cefdinir; Ceftin [cefuroxime axetil]; Celebrex [celecoxib]; Ciprofloxacin; Clindamycin; Clindamycin/lincomycin; Doxycycline; Erythromycin base; Latex; Levaquin [levofloxacin in d5w]; Levofloxacin; Macrobid [nitrofurantoin monohyd macro]; Macrodantin [nitrofurantoin macrocrystal]; Metformin and related; Neurontin [gabapentin]; Nitrofurantoin; Other; Propoxyphene; Sulfa antibiotics; Symbicort [budesonide-formoterol fumarate]; Tramadol hcl; Ultram [tramadol]; Vioxx [rofecoxib]; Voltaren [diclofenac sodium]; Ertapenem; Sulfacetamide sodium; and Tape   Review of Systems Review of Systems  Constitutional: Negative for fever.  Eyes: Negative for photophobia.  Respiratory: Negative for shortness of breath.   Cardiovascular: Negative for chest pain, palpitations and leg swelling.  Gastrointestinal: Positive for abdominal pain. Negative for anorexia, constipation, flatus, hematochezia, melena and vomiting.  Genitourinary: Negative for dysuria, vaginal bleeding and vaginal discharge.  Musculoskeletal: Negative for myalgias.  All other systems reviewed and are negative.    Physical Exam Updated Vital Signs BP 120/88 (BP Location: Right Arm)   Pulse 77   Temp 98.3 F (36.8 C) (Oral)   Resp 16   Ht 5\' 3"  (1.6  m)   Wt 81.6 kg (180 lb)   SpO2 98%   BMI 31.89 kg/m   Physical Exam  Constitutional: She is oriented to person, place, and time. She appears well-developed and well-nourished. No distress.  HENT:  Head: Normocephalic and atraumatic.  Mouth/Throat: No oropharyngeal exudate.  Eyes: Pupils are equal, round, and reactive to light. Conjunctivae are normal.  Neck: Normal range of motion.  Cardiovascular: Normal rate, regular rhythm, normal heart sounds and intact distal pulses.  Pulmonary/Chest: Effort normal and breath sounds normal. No stridor. She has no wheezes. She has no rales.  Abdominal: Soft. Bowel sounds are normal. She exhibits no distension and no mass. There is no  tenderness. There is no rigidity, no rebound, no guarding, no tenderness at McBurney's point and negative Murphy's sign. No hernia.  Musculoskeletal: Normal range of motion.  Neurological: She is alert and oriented to person, place, and time.  Skin: Skin is warm and dry. Capillary refill takes less than 2 seconds.  Psychiatric: She has a normal mood and affect.     ED Treatments / Results  Labs (all labs ordered are listed, but only abnormal results are displayed) Results for orders placed or performed during the hospital encounter of 12/09/17  Lipase, blood  Result Value Ref Range   Lipase 29 11 - 51 U/L  Comprehensive metabolic panel  Result Value Ref Range   Sodium 137 135 - 145 mmol/L   Potassium 3.7 3.5 - 5.1 mmol/L   Chloride 107 101 - 111 mmol/L   CO2 22 22 - 32 mmol/L   Glucose, Bld 101 (H) 65 - 99 mg/dL   BUN 8 6 - 20 mg/dL   Creatinine, Ser 1.61 0.44 - 1.00 mg/dL   Calcium 9.3 8.9 - 09.6 mg/dL   Total Protein 7.0 6.5 - 8.1 g/dL   Albumin 4.1 3.5 - 5.0 g/dL   AST 41 15 - 41 U/L   ALT 43 14 - 54 U/L   Alkaline Phosphatase 87 38 - 126 U/L   Total Bilirubin 0.7 0.3 - 1.2 mg/dL   GFR calc non Af Amer >60 >60 mL/min   GFR calc Af Amer >60 >60 mL/min   Anion gap 8 5 - 15  CBC  Result Value Ref Range   WBC 4.8 4.0 - 10.5 K/uL   RBC 4.09 3.87 - 5.11 MIL/uL   Hemoglobin 12.8 12.0 - 15.0 g/dL   HCT 04.5 40.9 - 81.1 %   MCV 92.2 78.0 - 100.0 fL   MCH 31.3 26.0 - 34.0 pg   MCHC 34.0 30.0 - 36.0 g/dL   RDW 91.4 78.2 - 95.6 %   Platelets 226 150 - 400 K/uL  Urinalysis, Routine w reflex microscopic  Result Value Ref Range   Color, Urine YELLOW YELLOW   APPearance CLEAR CLEAR   Specific Gravity, Urine >1.030 (H) 1.005 - 1.030   pH 5.5 5.0 - 8.0   Glucose, UA NEGATIVE NEGATIVE mg/dL   Hgb urine dipstick SMALL (A) NEGATIVE   Bilirubin Urine NEGATIVE NEGATIVE   Ketones, ur NEGATIVE NEGATIVE mg/dL   Protein, ur NEGATIVE NEGATIVE mg/dL   Nitrite NEGATIVE NEGATIVE    Leukocytes, UA NEGATIVE NEGATIVE  Urinalysis, Microscopic (reflex)  Result Value Ref Range   RBC / HPF 0-5 0 - 5 RBC/hpf   WBC, UA 0-5 0 - 5 WBC/hpf   Bacteria, UA MANY (A) NONE SEEN   Squamous Epithelial / LPF 6-30 (A) NONE SEEN   Mucus PRESENT   Rapid  urine drug screen (hospital performed)  Result Value Ref Range   Opiates POSITIVE (A) NONE DETECTED   Cocaine NONE DETECTED NONE DETECTED   Benzodiazepines NONE DETECTED NONE DETECTED   Amphetamines NONE DETECTED NONE DETECTED   Tetrahydrocannabinol NONE DETECTED NONE DETECTED   Barbiturates NONE DETECTED NONE DETECTED   Dg Abdomen Acute W/chest  Result Date: 12/09/2017 CLINICAL DATA:  57 year old female with history of sarcoidosis presenting with pelvic pain. EXAM: DG ABDOMEN ACUTE W/ 1V CHEST COMPARISON:  Abdominal CT dated 05/14/2017 and chest radiograph dated 02/19/2017 FINDINGS: The lungs are clear. There is no pleural effusion or pneumothorax. The cardiac silhouette is within normal limits. There is no bowel dilatation or evidence of obstruction. No free air or radiopaque calculi. Right upper quadrant cholecystectomy clips. The osseous structures and soft tissues are grossly unremarkable. IMPRESSION: Negative abdominal radiographs.  No acute cardiopulmonary disease. Electronically Signed   By: Elgie Collard M.D.   On: 12/09/2017 01:52    Radiology Dg Abdomen Acute W/chest  Result Date: 12/09/2017 CLINICAL DATA:  57 year old female with history of sarcoidosis presenting with pelvic pain. EXAM: DG ABDOMEN ACUTE W/ 1V CHEST COMPARISON:  Abdominal CT dated 05/14/2017 and chest radiograph dated 02/19/2017 FINDINGS: The lungs are clear. There is no pleural effusion or pneumothorax. The cardiac silhouette is within normal limits. There is no bowel dilatation or evidence of obstruction. No free air or radiopaque calculi. Right upper quadrant cholecystectomy clips. The osseous structures and soft tissues are grossly unremarkable. IMPRESSION:  Negative abdominal radiographs.  No acute cardiopulmonary disease. Electronically Signed   By: Elgie Collard M.D.   On: 12/09/2017 01:52    Procedures Procedures (including critical care time)  Medications Ordered in ED Medications  fosfomycin (MONUROL) packet 3 g (3 g Oral Given 12/09/17 0307)  dicyclomine (BENTYL) injection 20 mg (20 mg Intramuscular Given 12/09/17 0305)  ondansetron (ZOFRAN) injection 4 mg (4 mg Intravenous Given 12/09/17 0305)        Final Clinical Impressions(s) / ED Diagnoses   Final diagnoses:  Acute cystitis without hematuria   Her exam and vitals are benign and reassuring and her white count is at it's baseline. She is very well appearing.  She is allergic to all antibiotics except fosfomycin.  She has no signs of pyelo or sepsis and we will give her a dose of this medication.  I am not sure why she has not informed her doctors of these symptoms but I have instructed her to call them today.    Return for weakness, numbness, changes in vision or speech, fevers >100.4 unrelieved by medication, shortness of breath, intractable vomiting, or diarrhea, abdominal pain, Inability to tolerate liquids or food, cough, altered mental status or any concerns. No signs of systemic illness or infection. The patient is nontoxic-appearing on exam and vital signs are within normal limits.   I have reviewed the triage vital signs and the nursing notes. Pertinent labs &imaging results that were available during my care of the patient were reviewed by me and considered in my medical decision making (see chart for details).  After history, exam, and medical workup I feel the patient has been appropriately medically screened and is safe for discharge home. Pertinent diagnoses were discussed with the patient. Patient was given return precautions.   ED Discharge Orders        Ordered    phenazopyridine (PYRIDIUM) 200 MG tablet  3 times daily     12/09/17 0328  Markeita Alicia, MD 12/09/17 [phone]

## 2017-12-21 ENCOUNTER — Encounter: Payer: Self-pay | Admitting: Pediatrics

## 2017-12-21 ENCOUNTER — Ambulatory Visit (INDEPENDENT_AMBULATORY_CARE_PROVIDER_SITE_OTHER): Payer: Medicare Other | Admitting: Pediatrics

## 2017-12-21 VITALS — BP 110/80 | HR 96 | Temp 98.1°F | Resp 16

## 2017-12-21 DIAGNOSIS — J3089 Other allergic rhinitis: Secondary | ICD-10-CM

## 2017-12-21 DIAGNOSIS — J0100 Acute maxillary sinusitis, unspecified: Secondary | ICD-10-CM

## 2017-12-21 DIAGNOSIS — J4530 Mild persistent asthma, uncomplicated: Secondary | ICD-10-CM | POA: Diagnosis not present

## 2017-12-21 DIAGNOSIS — D869 Sarcoidosis, unspecified: Secondary | ICD-10-CM

## 2017-12-21 DIAGNOSIS — K219 Gastro-esophageal reflux disease without esophagitis: Secondary | ICD-10-CM | POA: Diagnosis not present

## 2017-12-21 MED ORDER — FLUTICASONE PROPIONATE 50 MCG/ACT NA SUSP: 2.0000 | Freq: Every day | NASAL | 5 refills | Status: DC | PRN

## 2017-12-21 MED ORDER — AZELASTINE HCL 0.1 % NA SOLN: NASAL | 5 refills | Status: DC

## 2017-12-21 MED ORDER — LEVALBUTEROL TARTRATE 45 MCG/ACT IN AERO
2.0000 | INHALATION_SPRAY | RESPIRATORY_TRACT | 2 refills | Status: DC | PRN
Start: 2017-12-21 — End: 2018-06-22

## 2017-12-21 MED ORDER — LEVALBUTEROL HCL 1.25 MG/3ML IN NEBU: 1.2500 mg | INHALATION_SOLUTION | Freq: Four times a day (QID) | RESPIRATORY_TRACT | 2 refills | Status: DC | PRN

## 2017-12-21 NOTE — Patient Instructions (Signed)
Allegra 180 mg-take 1 tablet once a day for runny nose or itchy eyes Azelastine 0.1%- 2 sprays per nostril twice a day if needed for runny nose Fluticasone 2 sprays per nostril once a day if needed for stuffy nose Montelukast 10 mg-take 1 tablet once a day for coughing or wheezing Flovent 44-2 puffs twice a day to prevent coughing or wheezing Xopenex 2 puffs every 6 hours if needed for coughing or wheezing or instead Xopenex 1.25 mg one unit dose every 6 hours Continue on your other medications See your pulmonary specialist regarding sarcoid

## 2017-12-21 NOTE — Progress Notes (Signed)
[address] [place] [address] Dept: [phone]  FOLLOW UP NOTE  Patient ID: Holly Rogers, female    DOB: 01-14-1961  Age: 57 y.o. MRN: [id number] Date of Office Visit: 12/21/2017  Assessment  Chief Complaint: Asthma (taking antibiotics january-march)  HPI Holly Rogers presents for follow-up of asthma and sarcoidosis. In January, February and March she received steroid shots and antibiotics by her primary care doctor.On March 31 of this year she was also given 5 days of prednisone. She has sarcoidosis. Her symptoms of coughing , wheezing or shortness of breath have improved. In March she was given Rocephin by her primary care doctor  which was intended for another patient and she had allergic symptoms .She has a history of an allergy to Rocephin . She is on montelukast  10 mg once a day and Xopenex 2 puffs every 6 hours if needed. Her nasal symptoms are controlled with fexofenadine 180 mg once a day.  Current medications are outlined in the chart   Drug Allergies:  Allergies  Allergen Reactions  . Budesonide-Formoterol Fumarate Palpitations    Shakes Shakes  . Rocephin [Ceftriaxone Sodium In Dextrose] Shortness Of Breath and Nausea And Vomiting  . Avandia [Rosiglitazone] Other (See Comments)    Heart heart Chest pain  . Flagyl [Metronidazole] Nausea And Vomiting and Other (See Comments)    Severe stomach pain  . Metformin Other (See Comments)    Chest pain  . Tramadol Hcl Other (See Comments)  . Valdecoxib Other (See Comments)    Unknown Unknown  . Vancomycin Nausea And Vomiting  . Amoxicillin Nausea Only  . Atorvastatin Other (See Comments)    Stomach pain   . Avelox [Moxifloxacin Hcl In Nacl]     Patient is unsure what happens   . Baclofen Nausea Only    Unknown, patient knows she couldn't take it  . Cefdinir Other (See Comments)    No reaction listed. Ask pt and enter  . Ceftin [Cefuroxime Axetil]     unknown  . Celebrex [Celecoxib]     Chest pain  .  Ciprofloxacin Nausea And Vomiting and Diarrhea  . Clindamycin Other (See Comments)    Cannot remember  . Clindamycin/Lincomycin     unknown  . Doxycycline     unknown  . Erythromycin Base Other (See Comments)    Ask pt and enter  . Latex Other (See Comments)    Ask pt and enter  . Levaquin [Levofloxacin In D5w]     unknown  . Levofloxacin Other (See Comments)  . Macrobid [employer] Macro]     unknown  . Macrodantin [Nitrofurantoin Macrocrystal]   . Metformin And Related     Chest pain  . Neurontin [Gabapentin]     Gave patient problems  . Nitrofurantoin     unknown  . Propoxyphene Nausea And Vomiting  . Sulfa Antibiotics Nausea And Vomiting  . Tramadol Hcl   . Ultram [Tramadol] Other (See Comments)    Hallucination unknown  . Vioxx [Rofecoxib] Other (See Comments)    Heart unknown  . Voltaren [Diclofenac Sodium] Diarrhea    Unknown. But she can use the gel  . Ertapenem Dermatitis  . Other Nausea And Vomiting, Rash and Other (See Comments)    Darvocet Irritates skin  . Sulfacetamide Sodium Nausea And Vomiting  . Tape Rash    Can use paper tape    Physical Exam: BP 110/80   Pulse 96   Temp 98.1 F (36.7 C) (Oral)  Resp 16   SpO2 96%    Physical Exam  Constitutional: She is oriented to person, place, and time. She appears well-developed and well-nourished.  HENT:  Eyes normal. Ears normal. Nose normal. Pharynx normal.  Neck: Neck supple. No thyromegaly present.  Cardiovascular:  S1 and S2 normal no murmurs  Pulmonary/Chest:  Clear to percussion and auscultation  Neurological: She is alert and oriented to person, place, and time.  Psychiatric: She has a normal mood and affect. Her behavior is normal. Judgment and thought content normal.  Vitals reviewed.   Diagnostics:  fVC 3.55 L FEV1 2.79 L. Predicted FVC 3.45 L predicted FEV1 2.70 L-the spirometry is in the normal range  Assessment and Plan: 1. Mild persistent asthma without  complication   2. Acute maxillary sinusitis, recurrence not specified   3. Sarcoidosis   4. Other allergic rhinitis   5. Gastroesophageal reflux disease without esophagitis   4. The patient does not have acute maxillary sinusitis. This is an old diagnosis that cannot be taken out of Epic  Meds ordered this encounter  Medications  . levalbuterol (XOPENEX HFA) 45 MCG/ACT inhaler    Sig: Inhale 2 puffs into the lungs every 4 (four) hours as needed for wheezing.    Dispense:  1 Inhaler    Refill:  2  . levalbuterol (XOPENEX) 1.25 MG/3ML nebulizer solution    Sig: Take 1.25 mg by nebulization every 6 (six) hours as needed for wheezing or shortness of breath.    Dispense:  75 mL    Refill:  2    BILL MEDICARE PART B. DX IS J45.30      GENERIC IS OK.  . fluticasone (FLONASE) 50 MCG/ACT nasal spray    Sig: Place 2 sprays into both nostrils daily as needed (for stuffy nose).    Dispense:  16 g    Refill:  5  . azelastine (ASTELIN) 0.1 % nasal spray    Sig: Take 2 sprays per nostril twice daily as needed for runny nose    Dispense:  30 mL    Refill:  5    For runny nose    Patient Instructions  Allegra 180 mg-take 1 tablet once a day for runny nose or itchy eyes Azelastine 0.1%- 2 sprays per nostril twice a day if needed for runny nose Fluticasone 2 sprays per nostril once a day if needed for stuffy nose Montelukast 10 mg-take 1 tablet once a day for coughing or wheezing Flovent 44-2 puffs twice a day to prevent coughing or wheezing Xopenex 2 puffs every 6 hours if needed for coughing or wheezing or instead Xopenex 1.25 mg one unit dose every 6 hours Continue on your other medications See your pulmonary specialist regarding sarcoid   Return in about 6 months (around 06/22/2018).    Thank you for the opportunity to care for this patient.  Please do not hesitate to contact me with questions.  Tonette BihariJ. A. Bardelas, M.D.  [facility] [address] [place], [place] [address] [phone]

## 2018-04-26 DIAGNOSIS — N760 Acute vaginitis: Secondary | ICD-10-CM | POA: Insufficient documentation

## 2018-04-28 ENCOUNTER — Emergency Department (HOSPITAL_BASED_OUTPATIENT_CLINIC_OR_DEPARTMENT_OTHER)
Admission: EM | Admit: 2018-04-28 | Discharge: 2018-04-29 | Disposition: A | Discharge status: Home / Self Care | Payer: Medicare Other | Attending: Emergency Medicine | Admitting: Emergency Medicine

## 2018-04-28 ENCOUNTER — Other Ambulatory Visit: Payer: Self-pay

## 2018-04-28 ENCOUNTER — Encounter (HOSPITAL_BASED_OUTPATIENT_CLINIC_OR_DEPARTMENT_OTHER): Payer: Self-pay

## 2018-04-28 ENCOUNTER — Emergency Department (HOSPITAL_BASED_OUTPATIENT_CLINIC_OR_DEPARTMENT_OTHER): Payer: Medicare Other

## 2018-04-28 DIAGNOSIS — Z79899 Other long term (current) drug therapy: Secondary | ICD-10-CM | POA: Diagnosis not present

## 2018-04-28 DIAGNOSIS — J449 Chronic obstructive pulmonary disease, unspecified: Secondary | ICD-10-CM | POA: Insufficient documentation

## 2018-04-28 DIAGNOSIS — E119 Type 2 diabetes mellitus without complications: Secondary | ICD-10-CM | POA: Diagnosis not present

## 2018-04-28 DIAGNOSIS — R0789 Other chest pain: Secondary | ICD-10-CM | POA: Diagnosis not present

## 2018-04-28 DIAGNOSIS — Z91040 Latex allergy status: Secondary | ICD-10-CM | POA: Insufficient documentation

## 2018-04-28 MED ORDER — LEVALBUTEROL HCL 0.63 MG/3ML IN NEBU
0.6300 mg | INHALATION_SOLUTION | Freq: Once | RESPIRATORY_TRACT | Status: AC
  Administered 2018-04-29: 0.63 mg via RESPIRATORY_TRACT
  Filled 2018-04-28: qty 3

## 2018-04-28 NOTE — ED Triage Notes (Signed)
C/o CP x today-NAD-steady gait with luggage in hand

## 2018-04-28 NOTE — ED Provider Notes (Signed)
[facility] EMERGENCY DEPARTMENT Provider Note   CSN: [id number] Arrival date & time: 04/28/18  2249     History   Chief Complaint Chief Complaint  Patient presents with  . Chest Pain    HPI Holly Rogers is a 57 y.o. female.  Patient is a 57 year old female with past medical history of COPD, fibromyalgia, sarcoidosis, mitral valve prolapse.  She presents today for evaluation of generalized malaise.  She has felt poorly for the past several weeks.  She reports a burning sensation to the left side of her chest and intermittent numbness to both arms.  She reports pain with cough, but cough is nonproductive.  She denies any fevers or chills.  The history is provided by the patient.  Chest Pain   This is a new problem. The current episode started more than 1 week ago. The problem occurs constantly. The problem has been gradually worsening. The pain is associated with breathing. The pain is present in the substernal region and lateral region. The pain is moderate. The quality of the pain is described as burning. The pain does not radiate. Associated symptoms include cough and shortness of breath. Pertinent negatives include no palpitations and no sputum production.    Past Medical History:  Diagnosis Date  . Acid reflux   . Arthritis   . COPD (chronic obstructive pulmonary disease) (HCC)   . COPD (chronic obstructive pulmonary disease) (HCC)   . DDD (degenerative disc disease), lumbar   . DDD (degenerative disc disease), lumbar   . Diabetes mellitus without complication (HCC)   . Diverticulitis   . Diverticulosis   . Fibromyalgia   . Mitral valve prolapse   . Pancreatitis 1997  . Pneumonia   . Sarcoidosis   . Sepsis (HCC)   . Sepsis [facility](HCC)     Patient Active Problem List   Diagnosis Date Noted  . Mild persistent asthma, uncomplicated 08/03/2017  . Gastroesophageal reflux disease 08/03/2017  . Acute sinusitis 07/24/2016  . Diverticulitis of intestine with  abscess 10/18/2015  . Chest wall pain, chronic 08/23/2015  . Mild persistent asthma with exacerbation 06/15/2015  . Other allergic rhinitis 06/15/2015  . Sepsis (HCC) 03/05/2014  . CAP (community acquired pneumonia) 03/05/2014  . Sarcoidosis 03/05/2014    Past Surgical History:  Procedure Laterality Date  . ABDOMINAL HYSTERECTOMY    . CHOLECYSTECTOMY       OB History   None      Home Medications    Prior to Admission medications   Medication Sig Start Date End Date Taking? Authorizing Provider  azelastine (ASTELIN) 0.1 % nasal spray Take 2 sprays per nostril twice daily as needed for runny nose 12/21/17   Fletcher AnonBardelas, Jose A, MD  azithromycin (ZITHROMAX) 250 MG tablet  07/31/17   [provider]  beclomethasone (QVAR) 40 MCG/ACT inhaler Inhale 2 puffs into the lungs 2 (two) times daily. 07/24/16   Bobbitt, Heywood Ilesalph Carter, MD  cetirizine (ZYRTEC) 10 MG tablet Take 10 mg by mouth.    [provider]  cyclobenzaprine (FLEXERIL) 10 MG tablet Take 10 mg by mouth.    [provider]  fexofenadine (ALLEGRA) 180 MG tablet Take 180 mg by mouth daily.    [provider]  fluticasone (FLONASE) 50 MCG/ACT nasal spray Place 2 sprays into both nostrils daily as needed (for stuffy nose). 12/21/17   Fletcher AnonBardelas, Jose A, MD  hydroxypropyl methylcellulose (ISOPTO TEARS) 2.5 % ophthalmic solution 1 drop. 12/14/14   [provider]  levalbuterol Pauline Aus(XOPENEX  HFA) 45 MCG/ACT inhaler Inhale 2 puffs into the lungs every 4 (four) hours as needed for wheezing. 12/21/17   Fletcher AnonBardelas, Jose A, MD  levalbuterol (XOPENEX) 1.25 MG/3ML nebulizer solution Take 1.25 mg by nebulization every 6 (six) hours as needed for wheezing or shortness of breath. 12/21/17   Fletcher AnonBardelas, Jose A, MD  meloxicam (MOBIC) 15 MG tablet  06/19/16   [provider]  montelukast (SINGULAIR) 10 MG tablet Take 10 mg by mouth at bedtime. 12/02/13   [provider]  Multiple Vitamin (MULTI-VITAMINS) TABS  Take by mouth.    [provider]  ondansetron (ZOFRAN-ODT) 8 MG disintegrating tablet Take by mouth. 07/11/15   [provider]  oxyCODONE-acetaminophen (PERCOCET/ROXICET) 5-325 MG tablet Take by mouth.    [provider]  phenazopyridine (PYRIDIUM) 200 MG tablet Take 1 tablet (200 mg total) by mouth 3 (three) times daily. Patient not taking: Reported on 12/21/2017 12/09/17   Palumbo, April, MD  polyethylene glycol powder (GLYCOLAX/MIRALAX) powder Take by mouth. 12/25/14   [provider]  potassium chloride SA (K-DUR,KLOR-CON) 20 MEQ tablet Take 20 mEq by mouth daily. 01/17/14   [provider]  sucralfate (CARAFATE) 1 GM/10ML suspension Take 10 mLs (1 g total) by mouth 4 (four) times daily -  with meals and at bedtime. 01/30/16   Palumbo, April, MD  triamcinolone (NASACORT ALLERGY 24HR) 55 MCG/ACT AERO nasal inhaler Place 2 sprays into the nose daily as needed (for stuffy nose). Reported on 01/30/2016    [provider]  triamcinolone cream (KENALOG) 0.1 % Apply topically. 07/20/15   [provider]    Family History Family History  Problem Relation Age of Onset  . Asthma Mother   . COPD Mother        smoked  . Rheum arthritis Mother   . Skin cancer Mother     Social History Social History   Tobacco Use  . Smoking status: Never Smoker  . Smokeless tobacco: Never Used  Substance Use Topics  . Alcohol use: No    Alcohol/week: 0.0 standard drinks  . Drug use: No     Allergies   Budesonide-formoterol fumarate; Rocephin [ceftriaxone sodium in dextrose]; Avandia [rosiglitazone]; Flagyl [metronidazole]; Metformin; Tramadol hcl; Valdecoxib; Vancomycin; Amoxicillin; Atorvastatin; Avelox [moxifloxacin hcl in nacl]; Baclofen; Cefdinir; Ceftin [cefuroxime axetil]; Celebrex [celecoxib]; Ciprofloxacin; Clindamycin; Clindamycin/lincomycin; Doxycycline; Erythromycin base; Latex; Levaquin [levofloxacin in d5w]; Levofloxacin; Macrobid  [nitrofurantoin monohyd macro]; Macrodantin [nitrofurantoin macrocrystal]; Metformin and related; Neurontin [gabapentin]; Nitrofurantoin; Propoxyphene; Sulfa antibiotics; Tramadol hcl; Ultram [tramadol]; Vioxx [rofecoxib]; Voltaren [diclofenac sodium]; Ertapenem; Other; Sulfacetamide sodium; and Tape   Review of Systems Review of Systems  Respiratory: Positive for cough and shortness of breath. Negative for sputum production.   Cardiovascular: Positive for chest pain. Negative for palpitations.  All other systems reviewed and are negative.    Physical Exam Updated Vital Signs BP (!) 143/88   Pulse 97   Temp 98.3 F (36.8 C) (Oral)   Resp 16   Ht 5\' 3"  (1.6 m)   Wt 81.7 kg   SpO2 97%   BMI 31.91 kg/m   Physical Exam  Constitutional: She is oriented to person, place, and time. She appears well-developed and well-nourished. No distress.  HENT:  Head: Normocephalic and atraumatic.  Neck: Normal range of motion. Neck supple.  Cardiovascular: Normal rate and regular rhythm. Exam reveals no gallop and no friction rub.  No murmur heard. Pulmonary/Chest: Effort normal and breath sounds normal. No respiratory distress. She has no wheezes.  Abdominal:  Soft. Bowel sounds are normal. She exhibits no distension. There is no tenderness.  Musculoskeletal: Normal range of motion.       Right lower leg: Normal. She exhibits no tenderness and no edema.       Left lower leg: Normal. She exhibits no tenderness and no edema.  Neurological: She is alert and oriented to person, place, and time.  Skin: Skin is warm and dry. She is not diaphoretic.  Nursing note and vitals reviewed.    ED Treatments / Results  Labs (all labs ordered are listed, but only abnormal results are displayed) Labs Reviewed  COMPREHENSIVE METABOLIC PANEL  CBC WITH DIFFERENTIAL/PLATELET  TROPONIN I    EKG EKG Interpretation  Date/Time:  Wednesday April 28 2018 22:56:03 EDT Ventricular Rate:  102 PR  Interval:  172 QRS Duration: 76 QT Interval:  332 QTC Calculation: 432 R Axis:   79 Text Interpretation:  Sinus tachycardia Otherwise normal ECG Confirmed by Geoffery Lyons ([id number]) on 04/28/2018 11:02:40 PM   Radiology No results found.  Procedures Procedures (including critical care time)  Medications Ordered in ED Medications  levalbuterol (XOPENEX) nebulizer solution 0.63 mg (has no administration in time range)     Initial Impression / Assessment and Plan / ED Course  I have reviewed the triage vital signs and the nursing notes.  Pertinent labs & imaging results that were available during my care of the patient were reviewed by me and considered in my medical decision making (see chart for details).  Patient presents with a burning type pain to her left chest that has been ongoing for several weeks.  Her EKG is unchanged and troponin is negative.  Chest x-ray is clear.  I am uncertain as to the exact etiology of her symptoms, however nothing today appears emergent.  She is concerned she is having a flareup of COPD or sarcoid and has been treated with prednisone in the past for similar symptoms.  I have agreed to prescribe a short course of prednisone to see if this helps and have her follow-up with her primary doctor in the near future.  Final Clinical Impressions(s) / ED Diagnoses   Final diagnoses:  None    ED Discharge Orders    None       Geoffery Lyons, MD 04/29/18 [phone]

## 2018-04-29 DIAGNOSIS — R0789 Other chest pain: Secondary | ICD-10-CM | POA: Diagnosis not present

## 2018-04-29 LAB — TROPONIN I: Troponin I: <0.03 ng/mL (ref <0.03)

## 2018-04-29 LAB — CBC WITH DIFFERENTIAL/PLATELET
BASOS PCT: 0 %
Basophils Absolute: 0 10*3/uL (ref 0.0–0.1)
EOS ABS: 0.1 10*3/uL (ref 0.0–0.7)
Eosinophils Relative: 1 %
HEMATOCRIT: 37.4 % (ref 36.0–46.0)
Hemoglobin: 12.4 g/dL (ref 12.0–15.0)
Lymphocytes Relative: 33 %
Lymphs Abs: 1.8 10*3/uL (ref 0.7–4.0)
MCH: 31.7 pg (ref 26.0–34.0)
MCHC: 33.2 g/dL (ref 30.0–36.0)
MCV: 95.7 fL (ref 78.0–100.0)
Monocytes Absolute: 0.6 10*3/uL (ref 0.1–1.0)
Monocytes Relative: 12 %
NEUTROS ABS: 2.8 10*3/uL (ref 1.7–7.7)
NEUTROS PCT: 54 %
Platelets: 227 10*3/uL (ref 150–400)
RBC: 3.91 MIL/uL (ref 3.87–5.11)
RDW: 12.2 % (ref 11.5–15.5)
WBC: 5.3 10*3/uL (ref 4.0–10.5)

## 2018-04-29 LAB — COMPREHENSIVE METABOLIC PANEL
ALK PHOS: 100 U/L (ref 38–126)
ALT: 35 U/L (ref 0–44)
ANION GAP: 10 (ref 5–15)
AST: 34 U/L (ref 15–41)
Albumin: 4 g/dL (ref 3.5–5.0)
BUN: 7 mg/dL (ref 6–20)
CALCIUM: 9 mg/dL (ref 8.9–10.3)
CHLORIDE: 107 mmol/L (ref 98–111)
CO2: 24 mmol/L (ref 22–32)
CREATININE: 0.8 mg/dL (ref 0.44–1.00)
Glucose, Bld: 93 mg/dL (ref 70–99)
Potassium: 3.6 mmol/L (ref 3.5–5.1)
SODIUM: 141 mmol/L (ref 135–145)
Total Bilirubin: 0.3 mg/dL (ref 0.3–1.2)
Total Protein: 6.8 g/dL (ref 6.5–8.1)

## 2018-04-29 LAB — CBG MONITORING, ED: Glucose-Capillary: 91 mg/dL (ref 70–99)

## 2018-04-29 MED ORDER — PREDNISONE 10 MG PO TABS: 20.0000 mg | ORAL_TABLET | Freq: Every day | ORAL | 0 refills | Status: DC

## 2018-04-29 NOTE — Discharge Instructions (Addendum)
Prednisone as prescribed.  Follow-up with your primary doctor if symptoms are not improving in the next week.

## 2018-05-11 ENCOUNTER — Telehealth: Payer: Self-pay | Admitting: Allergy

## 2018-05-11 ENCOUNTER — Other Ambulatory Visit: Payer: Self-pay | Admitting: Allergy and Immunology

## 2018-05-11 ENCOUNTER — Other Ambulatory Visit: Payer: Self-pay | Admitting: *Deleted

## 2018-05-11 DIAGNOSIS — J4531 Mild persistent asthma with (acute) exacerbation: Secondary | ICD-10-CM

## 2018-05-11 MED ORDER — FLUTICASONE PROPIONATE HFA 44 MCG/ACT IN AERO: 2.0000 | INHALATION_SPRAY | Freq: Two times a day (BID) | RESPIRATORY_TRACT | 2 refills | Status: AC

## 2018-05-11 MED ORDER — SPACER/AERO-HOLD CHAMBER MASK MISC: 1 refills | Status: DC

## 2018-05-11 NOTE — Telephone Encounter (Signed)
FYI  Holly BallRobin called and said she wanted you to take her off of the Singulair.Said she didn't think it was helping. Said she had not taken her Q_var in weeks. She is reading about medicines in a book.that I think is confusing to her. I told her to keep taking the Singulair and to go back on the Q-Var. until she sees you.I told her that coming off the Q-Var could make her breathing worse. She said she would go back on the Q-Var.

## 2018-05-11 NOTE — Telephone Encounter (Signed)
[facility] for [facility] so sent in new rx and spacer

## 2018-05-13 NOTE — Telephone Encounter (Signed)
Patient has sarcoidosis and a pulmonary specialist.  She will contact her pulmonologist to see what he wants to do with her medications

## 2018-05-24 ENCOUNTER — Telehealth: Payer: Self-pay | Admitting: Pediatrics

## 2018-05-24 NOTE — Telephone Encounter (Signed)
Left message for patient to call office.  

## 2018-05-24 NOTE — Telephone Encounter (Signed)
Pt is requesting an alternative medication to the Singulair. Please call in a different medicine to Singulair to [facility].

## 2018-05-25 ENCOUNTER — Ambulatory Visit: Payer: Medicare Other | Admitting: Pediatrics

## 2018-05-25 NOTE — Telephone Encounter (Signed)
Informed pt to stop montelukast. Told her to continue on her inhalers as instructed. She said she had a chest xray yesterday and she is waiting for results. She is having a dry hacking cough and states it feels like her lung is collapsing. She knows to check in with her pulmonologist doctor per Dr. Beaulah Dinning. She has a future apt with Dr. Beaulah Dinning 06/22/2018.

## 2018-05-25 NOTE — Telephone Encounter (Signed)
Call the patient.  Tell her to stop using montelukast since it is metabolized in the liver

## 2018-05-25 NOTE — Telephone Encounter (Signed)
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 She said she is having an ultrasound done soon, her liver is swollen and hurting. She is still taking this med however she is concerned if she should still be taking this. Please advise.

## 2018-06-04 DIAGNOSIS — B9689 Other specified bacterial agents as the cause of diseases classified elsewhere: Secondary | ICD-10-CM | POA: Insufficient documentation

## 2018-06-04 DIAGNOSIS — N760 Acute vaginitis: Secondary | ICD-10-CM

## 2018-06-04 DIAGNOSIS — G4452 New daily persistent headache (NDPH): Secondary | ICD-10-CM | POA: Insufficient documentation

## 2018-06-04 DIAGNOSIS — N9089 Other specified noninflammatory disorders of vulva and perineum: Secondary | ICD-10-CM | POA: Insufficient documentation

## 2018-06-15 NOTE — Telephone Encounter (Signed)
Patient has not called office back.

## 2018-06-22 ENCOUNTER — Ambulatory Visit (INDEPENDENT_AMBULATORY_CARE_PROVIDER_SITE_OTHER): Payer: Medicare Other | Admitting: Pediatrics

## 2018-06-22 ENCOUNTER — Encounter: Payer: Self-pay | Admitting: Pediatrics

## 2018-06-22 VITALS — BP 128/82 | HR 86 | Temp 98.1°F | Resp 20 | Ht 64.3 in | Wt 186.4 lb

## 2018-06-22 DIAGNOSIS — D869 Sarcoidosis, unspecified: Secondary | ICD-10-CM

## 2018-06-22 DIAGNOSIS — R208 Other disturbances of skin sensation: Secondary | ICD-10-CM | POA: Insufficient documentation

## 2018-06-22 DIAGNOSIS — J3089 Other allergic rhinitis: Secondary | ICD-10-CM | POA: Diagnosis not present

## 2018-06-22 DIAGNOSIS — T7840XA Allergy, unspecified, initial encounter: Secondary | ICD-10-CM | POA: Insufficient documentation

## 2018-06-22 DIAGNOSIS — K219 Gastro-esophageal reflux disease without esophagitis: Secondary | ICD-10-CM | POA: Diagnosis not present

## 2018-06-22 DIAGNOSIS — J4530 Mild persistent asthma, uncomplicated: Secondary | ICD-10-CM | POA: Diagnosis not present

## 2018-06-22 DIAGNOSIS — I341 Nonrheumatic mitral (valve) prolapse: Secondary | ICD-10-CM | POA: Insufficient documentation

## 2018-06-22 DIAGNOSIS — M26609 Unspecified temporomandibular joint disorder, unspecified side: Secondary | ICD-10-CM | POA: Insufficient documentation

## 2018-06-22 MED ORDER — LEVALBUTEROL TARTRATE 45 MCG/ACT IN AERO: 2.0000 | INHALATION_SPRAY | RESPIRATORY_TRACT | 2 refills | Status: AC | PRN

## 2018-06-22 MED ORDER — FEXOFENADINE HCL 180 MG PO TABS: 180.0000 mg | ORAL_TABLET | Freq: Every day | ORAL | 5 refills | Status: DC

## 2018-06-22 MED ORDER — LEVALBUTEROL HCL 1.25 MG/3ML IN NEBU: 1.2500 mg | INHALATION_SOLUTION | Freq: Four times a day (QID) | RESPIRATORY_TRACT | 2 refills | Status: AC | PRN

## 2018-06-22 MED ORDER — AZELASTINE HCL 0.1 % NA SOLN: NASAL | 5 refills | Status: AC

## 2018-06-22 MED ORDER — MONTELUKAST SODIUM 10 MG PO TABS: 10.0000 mg | ORAL_TABLET | Freq: Every day | ORAL | 5 refills | Status: DC

## 2018-06-22 MED ORDER — FLUTICASONE PROPIONATE 50 MCG/ACT NA SUSP: 2.0000 | Freq: Every day | NASAL | 5 refills | Status: AC | PRN

## 2018-06-22 NOTE — Patient Instructions (Signed)
Allegra 180 mg-take 1 tablet once a day for runny nose or itchy eyes Azelastine  0.1% - 2 sprays per nostril twice a day if needed for runny nose Fluticasone 2 sprays per nostril once a day if needed for stuffy nose Montelukast 10 mg-take 1 tablet once a day to prevent coughing or wheezing Xopenex 2 puffs every 6 hours if needed for coughing or wheezing or instead Xopenex 1.25 mg 1 unit dose every 6 hours Keep your appointment with your pulmonary specialist to treat your sarcoid and other lung symptoms

## 2018-06-22 NOTE — Progress Notes (Signed)
[address] [place]  [address] Dept: [phone]  FOLLOW UP NOTE  Patient ID: Holly Rogers, female    DOB: 04-24-1961  Age: 57 y.o. MRN: [id number] Date of Office Visit: 06/22/2018  Assessment  Chief Complaint: Asthma  HPI Holly Rogers presents for follow-up of asthma and allergic rhinitis.  Her asthma is well controlled.  She has sarcoid lung disease and is being followed by a pulmonologist at [facility].  She is scheduled to have a chest CT with dye.  She is not having breathing difficulties using montelukast 10 mg once a day.  She uses Xopenex 2 puffs every 6 hours if needed for coughing or wheezing.  Her nasal symptoms are controlled with fexofenadine 180 mg once a day and she has nasal stuffiness, she has fluticasone 2 sprays per nostril once a day and if needed , azelastine  0.1% -2 sprays per nostril twice a day  Her other current medications are outlined in the chart   Drug Allergies:  Allergies  Allergen Reactions  . Budesonide-Formoterol Fumarate Palpitations and Other (See Comments)    Shakes Shakes Shaky   . Rocephin [Ceftriaxone Sodium In Dextrose] Shortness Of Breath and Nausea And Vomiting  . Avandia [Rosiglitazone] Other (See Comments)    Heart heart Chest pain  . Flagyl [Metronidazole] Nausea And Vomiting and Other (See Comments)    Severe stomach pain  . Metformin Other (See Comments)    Chest pain  . Tramadol Hcl Other (See Comments)  . Valdecoxib Other (See Comments)    Unknown Unknown  . Vancomycin Nausea And Vomiting  . Amoxicillin Nausea Only  . Atorvastatin Other (See Comments)    Stomach pain   . Avelox [Moxifloxacin Hcl In Nacl]     Patient is unsure what happens   . Baclofen Nausea Only    Unknown, patient knows she couldn't take it  . Cefdinir Other (See Comments)    No reaction listed. Ask pt and enter  . Ceftin [Cefuroxime Axetil]     unknown  . Celebrex [Celecoxib]     Chest pain  . Ciprofloxacin Nausea And  Vomiting and Diarrhea  . Clindamycin Other (See Comments)    Cannot remember  . Clindamycin/Lincomycin     unknown  . Doxycycline     unknown  . Erythromycin Base Other (See Comments)    Ask pt and enter  . Latex Other (See Comments)    Ask pt and enter  . Levaquin [Levofloxacin In D5w]     unknown  . Levofloxacin Other (See Comments)  . Macrobid [employer] Macro]     unknown  . Macrodantin [Nitrofurantoin Macrocrystal]   . Metformin And Related     Chest pain  . Mirtazapine Other (See Comments)    Couldn't sleep and legs cramping   . Neurontin [Gabapentin]     Gave patient problems  . Nitrofurantoin     unknown  . Propoxyphene Nausea And Vomiting  . Sulfa Antibiotics Nausea And Vomiting  . Tramadol Hcl   . Ultram [Tramadol] Other (See Comments)    Hallucination unknown  . Vioxx [Rofecoxib] Other (See Comments)    Heart unknown  . Voltaren [Diclofenac Sodium] Diarrhea    Unknown. But she can use the gel  . Ertapenem Dermatitis  . Other Nausea And Vomiting, Rash and Other (See Comments)    Darvocet Irritates skin  . Sulfacetamide Sodium Nausea And Vomiting  . Tape Rash and Other (See Comments)    Can use  paper tape Irritates skin    Physical Exam: BP 128/82 (BP Location: Right Arm, Patient Position: Sitting, Cuff Size: Normal)   Pulse 86   Temp 98.1 F (36.7 C) (Oral)   Resp 20   Ht 5' 4.3" (1.633 m)   Wt 186 lb 6.4 oz (84.6 kg)   SpO2 96%   BMI 31.70 kg/m    Physical Exam  Constitutional: She is oriented to person, place, and time. She appears well-developed and well-nourished.  HENT:  Eyes normal.  Ears normal.  Nose normal.  Pharynx normal.  Neck: Neck supple.  Cardiovascular:  S1-S2 normal no murmurs  Pulmonary/Chest:  Clear to percussion and auscultation  Lymphadenopathy:    She has no cervical adenopathy.  Neurological: She is alert and oriented to person, place, and time.  Psychiatric: She has a normal mood and affect. Her  behavior is normal. Judgment and thought content normal.  Vitals reviewed.   Diagnostics: FVC 3.41 L FEV1 2.62 L.  Predicted FVC 3.43 L predicted FEV1 2.68 L-the spirometry is in the normal range  Assessment and Plan: 1. Mild persistent asthma without complication   2. Sarcoid   3. Other allergic rhinitis   4. Gastroesophageal reflux disease without esophagitis     Meds ordered this encounter  Medications  . azelastine (ASTELIN) 0.1 % nasal spray    Sig: Take 2 sprays per nostril twice daily as needed for runny nose    Dispense:  30 mL    Refill:  5    KEEP RX ON FILE.  PATIENT WILL CALL FOR FILL.  Marland Kitchen fexofenadine (ALLEGRA) 180 MG tablet    Sig: Take 1 tablet (180 mg total) by mouth daily.    Dispense:  30 tablet    Refill:  5    KEEP RX ON FILE.  PATIENT WILL CALL FOR FILL.  . fluticasone (FLONASE) 50 MCG/ACT nasal spray    Sig: Place 2 sprays into both nostrils daily as needed (for stuffy nose).    Dispense:  16 g    Refill:  5    KEEP RX ON FILE.  PATIENT WILL CALL FOR FILL.  Marland Kitchen montelukast (SINGULAIR) 10 MG tablet    Sig: Take 1 tablet (10 mg total) by mouth at bedtime.    Dispense:  30 tablet    Refill:  5    KEEP RX ON FILE.  PATIENT WILL CALL FOR FILL.  Marland Kitchen levalbuterol (XOPENEX HFA) 45 MCG/ACT inhaler    Sig: Inhale 2 puffs into the lungs every 4 (four) hours as needed for wheezing.    Dispense:  1 Inhaler    Refill:  2    KEEP RX ON FILE.  PATIENT WILL CALL FOR FILL.  Marland Kitchen levalbuterol (XOPENEX) 1.25 MG/3ML nebulizer solution    Sig: Take 1.25 mg by nebulization every 6 (six) hours as needed for wheezing or shortness of breath.    Dispense:  75 mL    Refill:  2    BILL MEDICARE PART B. DX IS J45.30      GENERIC IS OK.    KEEP RX ON FILE.  PATIENT WILL CALL FOR FILL.    Patient Instructions  Allegra 180 mg-take 1 tablet once a day for runny nose or itchy eyes Azelastine  0.1% - 2 sprays per nostril twice a day if needed for runny nose Fluticasone 2 sprays per  nostril once a day if needed for stuffy nose Montelukast 10 mg-take 1 tablet once a day to prevent coughing  or wheezing Xopenex 2 puffs every 6 hours if needed for coughing or wheezing or instead Xopenex 1.25 mg 1 unit dose every 6 hours Keep your appointment with your pulmonary specialist to treat your sarcoid and other lung symptoms   Return in about 1 year (around 06/23/2019).    Thank you for the opportunity to care for this patient.  Please do not hesitate to contact me with questions.  Tonette Bihari, M.D.  [facility] [address] [place], [place] [address] [phone]

## 2019-02-21 ENCOUNTER — Other Ambulatory Visit: Payer: Self-pay

## 2019-02-21 DIAGNOSIS — Z79899 Other long term (current) drug therapy: Secondary | ICD-10-CM | POA: Insufficient documentation

## 2019-02-21 DIAGNOSIS — Z91040 Latex allergy status: Secondary | ICD-10-CM | POA: Diagnosis not present

## 2019-02-21 DIAGNOSIS — J449 Chronic obstructive pulmonary disease, unspecified: Secondary | ICD-10-CM | POA: Insufficient documentation

## 2019-02-21 DIAGNOSIS — J45909 Unspecified asthma, uncomplicated: Secondary | ICD-10-CM | POA: Diagnosis not present

## 2019-02-21 DIAGNOSIS — E119 Type 2 diabetes mellitus without complications: Secondary | ICD-10-CM | POA: Insufficient documentation

## 2019-02-21 DIAGNOSIS — K589 Irritable bowel syndrome without diarrhea: Secondary | ICD-10-CM | POA: Insufficient documentation

## 2019-02-21 DIAGNOSIS — R1011 Right upper quadrant pain: Secondary | ICD-10-CM | POA: Diagnosis present

## 2019-02-22 ENCOUNTER — Emergency Department (HOSPITAL_BASED_OUTPATIENT_CLINIC_OR_DEPARTMENT_OTHER): Payer: Medicare Other

## 2019-02-22 ENCOUNTER — Encounter (HOSPITAL_BASED_OUTPATIENT_CLINIC_OR_DEPARTMENT_OTHER): Payer: Self-pay

## 2019-02-22 ENCOUNTER — Emergency Department (HOSPITAL_BASED_OUTPATIENT_CLINIC_OR_DEPARTMENT_OTHER)
Admission: EM | Admit: 2019-02-22 | Discharge: 2019-02-22 | Disposition: A | Discharge status: Home / Self Care | Payer: Medicare Other | Attending: Emergency Medicine | Admitting: Emergency Medicine

## 2019-02-22 DIAGNOSIS — K589 Irritable bowel syndrome without diarrhea: Secondary | ICD-10-CM | POA: Diagnosis not present

## 2019-02-22 HISTORY — DX: Unspecified abdominal pain: R10.9

## 2019-02-22 LAB — URINALYSIS, MICROSCOPIC (REFLEX)

## 2019-02-22 LAB — CBC WITH DIFFERENTIAL/PLATELET
Abs Immature Granulocytes: 0 10*3/uL (ref 0.00–0.07)
Basophils Absolute: 0 10*3/uL (ref 0.0–0.1)
Basophils Relative: 0 %
Eosinophils Absolute: 0.1 10*3/uL (ref 0.0–0.5)
Eosinophils Relative: 2 %
HCT: 36.5 % (ref 36.0–46.0)
Hemoglobin: 11.9 g/dL — ABNORMAL LOW (ref 12.0–15.0)
Immature Granulocytes: 0 %
Lymphocytes Relative: 52 %
Lymphs Abs: 2.4 10*3/uL (ref 0.7–4.0)
MCH: 30.1 pg (ref 26.0–34.0)
MCHC: 32.6 g/dL (ref 30.0–36.0)
MCV: 92.4 fL (ref 80.0–100.0)
Monocytes Absolute: 0.6 10*3/uL (ref 0.1–1.0)
Monocytes Relative: 13 %
Neutro Abs: 1.5 10*3/uL — ABNORMAL LOW (ref 1.7–7.7)
Neutrophils Relative %: 33 %
Platelets: 240 10*3/uL (ref 150–400)
RBC: 3.95 MIL/uL (ref 3.87–5.11)
RDW: 11.5 % (ref 11.5–15.5)
WBC: 4.6 10*3/uL (ref 4.0–10.5)
nRBC: 0 % (ref 0.0–0.2)

## 2019-02-22 LAB — BASIC METABOLIC PANEL
Anion gap: 9 (ref 5–15)
BUN: 9 mg/dL (ref 6–20)
CO2: 24 mmol/L (ref 22–32)
Calcium: 9.2 mg/dL (ref 8.9–10.3)
Chloride: 107 mmol/L (ref 98–111)
Creatinine, Ser: 0.7 mg/dL (ref 0.44–1.00)
GFR calc Af Amer: >60 mL/min (ref >60)
GFR calc non Af Amer: >60 mL/min (ref >60)
Glucose, Bld: 95 mg/dL (ref 70–99)
Potassium: 3.2 mmol/L — ABNORMAL LOW (ref 3.5–5.1)
Sodium: 140 mmol/L (ref 135–145)

## 2019-02-22 LAB — URINALYSIS, ROUTINE W REFLEX MICROSCOPIC
Bilirubin Urine: NEGATIVE
Glucose, UA: NEGATIVE mg/dL
Ketones, ur: NEGATIVE mg/dL
Leukocytes,Ua: NEGATIVE
Nitrite: NEGATIVE
Protein, ur: NEGATIVE mg/dL
Specific Gravity, Urine: 1.02 (ref 1.005–1.030)
pH: 5.5 (ref 5.0–8.0)

## 2019-02-22 MED ORDER — DICYCLOMINE HCL 20 MG PO TABS: 20.0000 mg | ORAL_TABLET | Freq: Two times a day (BID) | ORAL | 0 refills | Status: DC

## 2019-02-22 MED ORDER — DICYCLOMINE HCL 10 MG/ML IM SOLN
20.0000 mg | Freq: Once | INTRAMUSCULAR | Status: AC
  Administered 2019-02-22: 20 mg via INTRAMUSCULAR
  Filled 2019-02-22: qty 2

## 2019-02-22 NOTE — ED Notes (Signed)
ED Provider at bedside. 

## 2019-02-22 NOTE — ED Notes (Signed)
Patient transported to CT 

## 2019-02-22 NOTE — ED Provider Notes (Signed)
[facility] EMERGENCY DEPARTMENT Provider Note   CSN: [id number] Arrival date & time: 02/21/19  2358    History   Chief Complaint Chief Complaint  Patient presents with   Abdominal Pain   Nausea   Urinary Frequency    HPI Isaiah Torok is a 58 y.o. female.     The history is provided by the patient.  Abdominal Pain  Pain location:  RUQ and R flank Pain quality: sharp   Pain radiates to:  RLQ Pain severity:  Severe Onset quality:  Gradual Duration:  12 weeks Timing:  Constant Progression:  Unchanged Chronicity:  Chronic Context: not alcohol use, not recent travel, not retching, not sick contacts, not suspicious food intake and not trauma   Relieved by:  Nothing Worsened by:  Nothing Ineffective treatments: home narcotic regimen. Associated symptoms: no chest pain, no constipation, no cough, no diarrhea, no fever, no flatus, no hematemesis, no hematochezia, no hematuria, no melena, no nausea, no shortness of breath, no sore throat, no vaginal bleeding, no vaginal discharge and no vomiting   Risk factors: no alcohol abuse   Patient with h/o fibromyalgia and IBS has been having this pain since March.  Seen at [facility] and [facility] for same.  Has seen her GI specialist as well.  No f/c/r. No n/v/d.    Past Medical History:  Diagnosis Date   Acid reflux    Arthritis    COPD (chronic obstructive pulmonary disease) (HCC)    COPD (chronic obstructive pulmonary disease) (HCC)    DDD (degenerative disc disease), lumbar    DDD (degenerative disc disease), lumbar    Diabetes mellitus without complication (HCC)    Diverticulitis    Diverticulosis    Fibromyalgia    Mitral valve prolapse    Pancreatitis 1997   Pneumonia    Sarcoidosis    Sepsis (HCC)    Sepsis [facility])     Patient Active Problem List   Diagnosis Date Noted   Allergy 06/22/2018   Hyperalgesia 06/22/2018   MVP (mitral valve prolapse) 06/22/2018   Temporomandibular joint disorder  06/22/2018   Sarcoid 06/22/2018   Bacterial vaginitis 06/04/2018   New daily persistent headache 06/04/2018   Vulvar fissure 06/04/2018   Acute vaginitis 04/26/2018   Mild persistent asthma, uncomplicated 08/03/2017   Possible exposure to STD 11/11/2016   Chronic bronchitis (HCC) 04/10/2016   Chronic fatigue syndrome 04/10/2016   DDD (degenerative disc disease), lumbar 04/10/2016   Clostridium difficile infection 03/08/2016   Sigmoid diverticulosis 03/06/2016   Urinary incontinence 10/30/2015   Sinusitis 10/20/2015   Anxiety 10/20/2015   History of Clostridium difficile colitis 10/20/2015   Hypoglycemia 10/20/2015   Type 2 diabetes mellitus without complication, without long-term current use of insulin (HCC) 10/20/2015   Diarrhea 10/19/2015   Diverticulosis 10/18/2015   Diverticulitis of large intestine 10/18/2015   Hypokalemia 10/18/2015   Rectal bleed 10/18/2015   Chronic neck pain 09/27/2015   Mild persistent asthma with exacerbation 06/15/2015   Other allergic rhinitis 06/15/2015   IBS (irritable bowel syndrome) 05/15/2015   History of nonmelanoma skin cancer 04/03/2015   Chest pain 03/07/2015   Gastroesophageal reflux disease without esophagitis 03/07/2015   Asthma 03/07/2015   Fibromyalgia 03/07/2015   Sepsis (HCC) 03/05/2014   CAP (community acquired pneumonia) 03/05/2014   Incidental pulmonary nodule 12/11/2011   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      Home Medications    Prior to Admission medications   Medication Sig Start Date End Date Taking? Authorizing Provider  acetaminophen (TYLENOL) 650 MG CR tablet Take by mouth.    [provider]    ALPRAZolam Prudy Feeler(XANAX) 1 MG tablet Take by mouth.    [provider]  azelastine (ASTELIN) 0.1 % nasal spray Take 2 sprays per nostril twice daily as needed for runny nose 06/22/18   Fletcher AnonBardelas, Jose A, MD  beclomethasone (QVAR) 40 MCG/ACT inhaler Inhale into the lungs. 07/24/16   [provider]  benzonatate (TESSALON) 100 MG capsule Take 100 mg by mouth 3 (three) times daily as needed for cough.    [provider]  cyclobenzaprine (FLEXERIL) 10 MG tablet Take 10 mg by mouth.    [provider]  diclofenac sodium (VOLTAREN) 1 % GEL APPLY AS DIRECTED 09/13/14   [provider]  fexofenadine (ALLEGRA) 180 MG tablet Take 1 tablet (180 mg total) by mouth daily. 06/22/18   Fletcher AnonBardelas, Jose A, MD  fluticasone (FLONASE) 50 MCG/ACT nasal spray Place 2 sprays into both nostrils daily as needed (for stuffy nose). 06/22/18   Fletcher AnonBardelas, Jose A, MD  fluticasone (FLOVENT HFA) 44 MCG/ACT inhaler Inhale 2 puffs into the lungs 2 (two) times daily. Patient not taking: Reported on 06/22/2018 05/11/18   Fletcher AnonBardelas, Jose A, MD  hydroxypropyl methylcellulose (ISOPTO TEARS) 2.5 % ophthalmic solution 1 drop. 12/14/14   [provider]  hyoscyamine (LEVSIN, ANASPAZ) 0.125 MG tablet Take by mouth.    [provider]  Lactobacillus Rhamnosus, GG, (CULTURELLE PO) Take by mouth.    [provider]  levalbuterol Pauline Aus(XOPENEX HFA) 45 MCG/ACT inhaler Inhale 2 puffs into the lungs every 4 (four) hours as needed for wheezing. 06/22/18   Fletcher AnonBardelas, Jose A, MD  levalbuterol (XOPENEX) 1.25 MG/3ML nebulizer solution Take 1.25 mg by nebulization every 6 (six) hours as needed for wheezing or shortness of breath. 06/22/18   Fletcher AnonBardelas, Jose A, MD  montelukast (SINGULAIR) 10 MG tablet Take 1 tablet (10 mg total) by mouth at bedtime. 06/22/18   Fletcher AnonBardelas, Jose A, MD  Multiple Vitamin (MULTI-VITAMINS) TABS Take by mouth.    [provider]  oxyCODONE-acetaminophen (PERCOCET/ROXICET) 5-325  MG tablet Take by mouth.    [provider]  phentermine (ADIPEX-P) 37.5 MG tablet Take 1/2 tab.at breakfast and 1/2 tab. after lunch 06/01/18   [provider]  polyvinyl alcohol (LIQUIFILM TEARS) 1.4 % ophthalmic solution Administer 1 drop to both eyes Three (3) times a day as needed for dry eyes.    [provider]  potassium chloride SA (K-DUR,KLOR-CON) 20 MEQ tablet Take 20 mEq by mouth daily. 01/17/14   [provider]  promethazine (PHENERGAN) 25 MG tablet  04/14/18   [provider]  Spacer/Aero-Holding Chambers (AEROCHAMBER W/FLOWSIGNAL) inhaler Use as directed with inhaler 05/11/18   [provider]  triamcinolone cream (KENALOG) 0.1 % Apply topically. 07/20/15   [provider]  valACYclovir (VALTREX) 1000 MG tablet Take by mouth. 06/06/18   [provider]  vitamin B-12 (CYANOCOBALAMIN) 100 MCG tablet Take by mouth. 12/01/08   [provider]    Family History Family History  Problem Relation Age of Onset   Asthma Mother    COPD Mother        smoked  Rheum arthritis Mother    Skin cancer Mother     Social History Social History   Tobacco Use   Smoking status: Never Smoker   Smokeless tobacco: Never Used  Substance Use Topics   Alcohol use: No    Alcohol/week: 0.0 standard drinks   Drug use: No     Allergies   Budesonide-formoterol fumarate; Rocephin [ceftriaxone sodium in dextrose]; Avandia [rosiglitazone]; Flagyl [metronidazole]; Metformin; Tramadol hcl; Valdecoxib; Vancomycin; Amoxicillin; Atorvastatin; Avelox [moxifloxacin hcl in nacl]; Baclofen; Cefdinir; Ceftin [cefuroxime axetil]; Celebrex [celecoxib]; Ciprofloxacin; Clindamycin; Clindamycin/lincomycin; Doxycycline; Erythromycin base; Latex; Levaquin [levofloxacin in d5w]; Levofloxacin; Macrobid [nitrofurantoin monohyd macro]; Macrodantin [nitrofurantoin macrocrystal]; Metformin and related; Mirtazapine; Neurontin [gabapentin];  Nitrofurantoin; Propoxyphene; Sulfa antibiotics; Tramadol hcl; Ultram [tramadol]; Vioxx [rofecoxib]; Voltaren [diclofenac sodium]; Ertapenem; Other; Sulfacetamide sodium; and Tape   Review of Systems Review of Systems  Constitutional: Negative for fever.  HENT: Negative for sore throat.   Respiratory: Negative for cough and shortness of breath.   Cardiovascular: Negative for chest pain.  Gastrointestinal: Positive for abdominal pain. Negative for constipation, diarrhea, flatus, hematemesis, hematochezia, melena, nausea and vomiting.  Genitourinary: Positive for flank pain. Negative for hematuria, vaginal bleeding and vaginal discharge.  All other systems reviewed and are negative.    Physical Exam Updated Vital Signs BP (!) 139/97 (BP Location: Right Arm)    Pulse 80    Temp 98.2 F (36.8 C) (Oral)    Resp 18    Ht 5\' 4"  (1.626 m)    Wt 85.3 kg    SpO2 100%    BMI 32.27 kg/m   Physical Exam Vitals signs and nursing note reviewed.  Constitutional:      General: She is not in acute distress.    Appearance: She is obese.  HENT:     Head: Normocephalic and atraumatic.     Nose: Nose normal.  Eyes:     Conjunctiva/sclera: Conjunctivae normal.     Pupils: Pupils are equal, round, and reactive to light.  Neck:     Musculoskeletal: Normal range of motion and neck supple.  Cardiovascular:     Rate and Rhythm: Normal rate and regular rhythm.     Pulses: Normal pulses.     Heart sounds: Normal heart sounds.  Pulmonary:     Effort: Pulmonary effort is normal.     Breath sounds: Normal breath sounds.  Abdominal:     General: Abdomen is flat. Bowel sounds are normal. There is no distension.     Palpations: There is no mass.     Tenderness: There is no abdominal tenderness. There is no guarding or rebound.     Hernia: No hernia is present.  Musculoskeletal: Normal range of motion.  Skin:    General: Skin is warm and dry.     Capillary Refill: Capillary refill takes less than 2  seconds.  Neurological:     General: No focal deficit present.     Mental Status: She is alert and oriented to person, place, and time.  Psychiatric:        Mood and Affect: Mood normal.        Behavior: Behavior normal.      ED Treatments / Results  Labs (all labs ordered are listed, but only abnormal results are displayed) Results for orders placed or performed during the hospital encounter of 02/22/19  CBC with Differential/Platelet  Result Value Ref Range   WBC 4.6 4.0 - 10.5 K/uL   RBC 3.95 3.87 - 5.11 MIL/uL   Hemoglobin  11.9 (L) 12.0 - 15.0 g/dL   HCT 36.5 36.0 - 46.0 %   MCV 92.4 80.0 - 100.0 fL   MCH 30.1 26.0 - 34.0 pg   MCHC 32.6 30.0 - 36.0 g/dL   RDW 11.5 11.5 - 15.5 %   Platelets 240 150 - 400 K/uL   nRBC 0.0 0.0 - 0.2 %   Neutrophils Relative % 33 %   Neutro Abs 1.5 (L) 1.7 - 7.7 K/uL   Lymphocytes Relative 52 %   Lymphs Abs 2.4 0.7 - 4.0 K/uL   Monocytes Relative 13 %   Monocytes Absolute 0.6 0.1 - 1.0 K/uL   Eosinophils Relative 2 %   Eosinophils Absolute 0.1 0.0 - 0.5 K/uL   Basophils Relative 0 %   Basophils Absolute 0.0 0.0 - 0.1 K/uL   Immature Granulocytes 0 %   Abs Immature Granulocytes 0.00 0.00 - 0.07 K/uL  Basic metabolic panel  Result Value Ref Range   Sodium 140 135 - 145 mmol/L   Potassium 3.2 (L) 3.5 - 5.1 mmol/L   Chloride 107 98 - 111 mmol/L   CO2 24 22 - 32 mmol/L   Glucose, Bld 95 70 - 99 mg/dL   BUN 9 6 - 20 mg/dL   Creatinine, Ser 0.70 0.44 - 1.00 mg/dL   Calcium 9.2 8.9 - 10.3 mg/dL   GFR calc non Af Amer >60 >60 mL/min   GFR calc Af Amer >60 >60 mL/min   Anion gap 9 5 - 15  Urinalysis, Routine w reflex microscopic  Result Value Ref Range   Color, Urine YELLOW YELLOW   APPearance CLEAR CLEAR   Specific Gravity, Urine 1.020 1.005 - 1.030   pH 5.5 5.0 - 8.0   Glucose, UA NEGATIVE NEGATIVE mg/dL   Hgb urine dipstick TRACE (A) NEGATIVE   Bilirubin Urine NEGATIVE NEGATIVE   Ketones, ur NEGATIVE NEGATIVE mg/dL   Protein, ur  NEGATIVE NEGATIVE mg/dL   Nitrite NEGATIVE NEGATIVE   Leukocytes,Ua NEGATIVE NEGATIVE  Urinalysis, Microscopic (reflex)  Result Value Ref Range   RBC / HPF 0-5 0 - 5 RBC/hpf   WBC, UA 0-5 0 - 5 WBC/hpf   Bacteria, UA RARE (A) NONE SEEN   Squamous Epithelial / LPF 0-5 0 - 5   Mucus PRESENT    Ct Renal Glaus Study  Result Date: 02/22/2019 CLINICAL DATA:  Pain.  Right upper quadrant pain. EXAM: CT ABDOMEN AND PELVIS WITHOUT CONTRAST TECHNIQUE: Multidetector CT imaging of the abdomen and pelvis was performed following the standard protocol without IV contrast. COMPARISON:  01/11/2019 FINDINGS: Lower chest: No acute abnormality. Hepatobiliary: There is evidence of a patent steatosis. There is pneumobilia which is likely secondary to prior sphincterotomy. The patient is status post prior cholecystectomy. Pancreas: Unremarkable. No pancreatic ductal dilatation or surrounding inflammatory changes. Spleen: Normal in size without focal abnormality. Adrenals/Urinary Tract: Adrenal glands are unremarkable. Kidneys are normal, without renal calculi, focal lesion, or hydronephrosis. Bladder is unremarkable. Stomach/Bowel: There is severe sigmoid diverticulosis without CT evidence of diverticulitis. There is a normal appendix in the right lower quadrant. The stomach is unremarkable aside from a stable moderate-sized hiatal hernia. Vascular/Lymphatic: No significant vascular findings are present. No enlarged abdominal or pelvic lymph nodes. Reproductive: Status post hysterectomy. No adnexal masses. Other: There is a small fat containing periumbilical hernia. Musculoskeletal: No acute or significant osseous findings. IMPRESSION: 1. No acute abnormality detected. 2. Multiple chronic findings as above, not significantly changed from prior CT. Electronically Signed   By: Harrell Gave  Green M.D.   On: 02/22/2019 00:48    Radiology No results found.  Procedures Procedures (including critical care time)  Medications  Ordered in ED Medications  dicyclomine (BENTYL) injection 20 mg (has no administration in time range)      Patient does not appear to be in severe pain. Given patient's allergy list she cannot get toradol.  She drove here and this pain is chronic and I will not be giving more narcotics.  She gets 90 tablets every month.  There are no emergency conditions at this time.  Patient is stable for follow up with her PMD and GI specialist.    Final Clinical Impressions(s) / ED Diagnoses   Return for intractable cough, coughing up blood,fevers >100.4 unrelieved by medication, shortness of breath, intractable vomiting, chest pain, shortness of breath, weakness,numbness, changes in speech, facial asymmetry,abdominal pain, passing out,Inability to tolerate liquids or food, cough, altered mental status or any concerns. No signs of systemic illness or infection. The patient is nontoxic-appearing on exam and vital signs are within normal limits.   I have reviewed the triage vital signs and the nursing notes. Pertinent labs &imaging results that were available during my care of the patient were reviewed by me and considered in my medical decision making (see chart for details).  After history, exam, and medical workup I feel the patient has been appropriately medically screened and is safe for discharge home. Pertinent diagnoses were discussed with the patient. Patient was given return precautions   Joaquin Knebel, MD 02/22/19 [id number]

## 2019-02-22 NOTE — ED Triage Notes (Signed)
Patient c/o RUQ abdominal pain, nausea and urinary frequency X 4 weeks; sxs have worsened over the past week.

## 2019-03-27 ENCOUNTER — Other Ambulatory Visit: Payer: Self-pay

## 2019-03-27 ENCOUNTER — Emergency Department (HOSPITAL_BASED_OUTPATIENT_CLINIC_OR_DEPARTMENT_OTHER)
Admission: EM | Admit: 2019-03-27 | Discharge: 2019-03-28 | Disposition: A | Discharge status: Home / Self Care | Payer: Medicare Other | Attending: Emergency Medicine | Admitting: Emergency Medicine

## 2019-03-27 ENCOUNTER — Encounter (HOSPITAL_BASED_OUTPATIENT_CLINIC_OR_DEPARTMENT_OTHER): Payer: Self-pay

## 2019-03-27 DIAGNOSIS — J45909 Unspecified asthma, uncomplicated: Secondary | ICD-10-CM | POA: Insufficient documentation

## 2019-03-27 DIAGNOSIS — E119 Type 2 diabetes mellitus without complications: Secondary | ICD-10-CM | POA: Diagnosis not present

## 2019-03-27 DIAGNOSIS — J069 Acute upper respiratory infection, unspecified: Secondary | ICD-10-CM | POA: Diagnosis not present

## 2019-03-27 DIAGNOSIS — J449 Chronic obstructive pulmonary disease, unspecified: Secondary | ICD-10-CM | POA: Insufficient documentation

## 2019-03-27 DIAGNOSIS — R0789 Other chest pain: Secondary | ICD-10-CM | POA: Insufficient documentation

## 2019-03-27 DIAGNOSIS — Z20828 Contact with and (suspected) exposure to other viral communicable diseases: Secondary | ICD-10-CM | POA: Diagnosis not present

## 2019-03-27 NOTE — ED Triage Notes (Signed)
Pt reports L sided chest pain that radiates into arm since wed- pt also reports coughing/sneezing/HA/chills/SOB x couple weeks.

## 2019-03-28 ENCOUNTER — Emergency Department (HOSPITAL_BASED_OUTPATIENT_CLINIC_OR_DEPARTMENT_OTHER): Payer: Medicare Other

## 2019-03-28 DIAGNOSIS — R0789 Other chest pain: Secondary | ICD-10-CM | POA: Diagnosis not present

## 2019-03-28 LAB — CBC WITH DIFFERENTIAL/PLATELET
Abs Immature Granulocytes: 0 10*3/uL (ref 0.00–0.07)
Basophils Absolute: 0 10*3/uL (ref 0.0–0.1)
Basophils Relative: 0 %
Eosinophils Absolute: 0.1 10*3/uL (ref 0.0–0.5)
Eosinophils Relative: 2 %
HCT: 40.5 % (ref 36.0–46.0)
Hemoglobin: 13.1 g/dL (ref 12.0–15.0)
Immature Granulocytes: 0 %
Lymphocytes Relative: 48 %
Lymphs Abs: 2.6 10*3/uL (ref 0.7–4.0)
MCH: 30.6 pg (ref 26.0–34.0)
MCHC: 32.3 g/dL (ref 30.0–36.0)
MCV: 94.6 fL (ref 80.0–100.0)
Monocytes Absolute: 0.7 10*3/uL (ref 0.1–1.0)
Monocytes Relative: 13 %
Neutro Abs: 2 10*3/uL (ref 1.7–7.7)
Neutrophils Relative %: 37 %
Platelets: 242 10*3/uL (ref 150–400)
RBC: 4.28 MIL/uL (ref 3.87–5.11)
RDW: 11.9 % (ref 11.5–15.5)
WBC: 5.4 10*3/uL (ref 4.0–10.5)
nRBC: 0 % (ref 0.0–0.2)

## 2019-03-28 LAB — BASIC METABOLIC PANEL
Anion gap: 10 (ref 5–15)
BUN: 11 mg/dL (ref 6–20)
CO2: 25 mmol/L (ref 22–32)
Calcium: 9.4 mg/dL (ref 8.9–10.3)
Chloride: 107 mmol/L (ref 98–111)
Creatinine, Ser: 0.64 mg/dL (ref 0.44–1.00)
GFR calc Af Amer: >60 mL/min (ref >60)
GFR calc non Af Amer: >60 mL/min (ref >60)
Glucose, Bld: 97 mg/dL (ref 70–99)
Potassium: 3.8 mmol/L (ref 3.5–5.1)
Sodium: 142 mmol/L (ref 135–145)

## 2019-03-28 LAB — SARS CORONAVIRUS 2 AG (30 MIN TAT): SARS Coronavirus 2 Ag: NEGATIVE

## 2019-03-28 MED ORDER — ONDANSETRON 4 MG PO TBDP
4.0000 mg | ORAL_TABLET | Freq: Once | ORAL | Status: AC
  Administered 2019-03-28: 4 mg via ORAL
  Filled 2019-03-28: qty 1

## 2019-03-28 MED ORDER — NAPROXEN 500 MG PO TABS: 500.0000 mg | ORAL_TABLET | Freq: Two times a day (BID) | ORAL | 0 refills | Status: DC

## 2019-03-28 MED ORDER — DIAZEPAM 5 MG PO TABS
5.0000 mg | ORAL_TABLET | Freq: Once | ORAL | Status: AC
  Administered 2019-03-28: 5 mg via ORAL
  Filled 2019-03-28: qty 1

## 2019-03-28 NOTE — ED Notes (Signed)
Pt understood dc material. NAD noted. All questions answered to satisfaction. Pt escorted to check out counter. 

## 2019-03-28 NOTE — ED Notes (Signed)
Pt reports she has been sneezing and coughing; having nausea and diarrhea, plus chest pain. States Sx have been going on since April. She is requesting covid test. She also reports chills, sneezing and coughing since Tuesday

## 2019-03-28 NOTE — Discharge Instructions (Addendum)
You were seen today for multiple complaints including chest pain.  Your work-up is reassuring including your EKG and chest x-ray.  Your COVID testing is negative.  Follow-up with your primary physician.

## 2019-03-28 NOTE — ED Notes (Signed)
ED Provider at bedside. 

## 2019-03-28 NOTE — ED Provider Notes (Addendum)
[facility] EMERGENCY DEPARTMENT Provider Note   CSN: [id number] Arrival date & time: 03/27/19  2108    History   Chief Complaint Chief Complaint  Patient presents with   Chest Pain    HPI Holly MuscaRobin Rogers is a 58 y.o. female.     HPI  This is a 58 year old female with a history of diabetes, COPD who presents with multiple complaints.  Patient states that she has had left-sided chest pain, cough, sneezing, chills since Wednesday.  Patient begins her history back in April when she believes she may have had COVID.  She reports at that time she had similar symptoms.  She did not get tested and improved with Augmentin from her primary physician.  She states that since this last week she has had chills, nonproductive cough, left-sided chest pain that radiates into the left arm.  She states the chest pain is been constant and pressure-like in nature.  It is not worsened with exertion or breathing.  She took a Microbiologistercocet and a Flexeril with minimal relief.  She does state that some of her pain is worse with movement of her left arm and palpation of the chest.  Additionally she states that she had some diarrhea and nausea but that self resolved after 1 day.  She has not had any recent COVID contacts.  Past Medical History:  Diagnosis Date   Acid reflux    Arthritis    COPD (chronic obstructive pulmonary disease) (HCC)    COPD (chronic obstructive pulmonary disease) (HCC)    DDD (degenerative disc disease), lumbar    DDD (degenerative disc disease), lumbar    Diabetes mellitus without complication (HCC)    Diverticulitis    Diverticulosis    Fibromyalgia    Functional abdominal pain syndrome    Mitral valve prolapse    Pancreatitis 1997   Pneumonia    Sarcoidosis    Sepsis (HCC)    Sepsis [facility](HCC)     Patient Active Problem List   Diagnosis Date Noted   Allergy 06/22/2018   Hyperalgesia 06/22/2018   MVP (mitral valve prolapse) 06/22/2018    Temporomandibular joint disorder 06/22/2018   Sarcoid 06/22/2018   Bacterial vaginitis 06/04/2018   New daily persistent headache 06/04/2018   Vulvar fissure 06/04/2018   Acute vaginitis 04/26/2018   Mild persistent asthma, uncomplicated 08/03/2017   Possible exposure to STD 11/11/2016   Chronic bronchitis (HCC) 04/10/2016   Chronic fatigue syndrome 04/10/2016   DDD (degenerative disc disease), lumbar 04/10/2016   Clostridium difficile infection 03/08/2016   Sigmoid diverticulosis 03/06/2016   Urinary incontinence 10/30/2015   Sinusitis 10/20/2015   Anxiety 10/20/2015   History of Clostridium difficile colitis 10/20/2015   Hypoglycemia 10/20/2015   Type 2 diabetes mellitus without complication, without long-term current use of insulin (HCC) 10/20/2015   Diarrhea 10/19/2015   Diverticulosis 10/18/2015   Diverticulitis of large intestine 10/18/2015   Hypokalemia 10/18/2015   Rectal bleed 10/18/2015   Chronic neck pain 09/27/2015   Mild persistent asthma with exacerbation 06/15/2015   Other allergic rhinitis 06/15/2015   IBS (irritable bowel syndrome) 05/15/2015   History of nonmelanoma skin cancer 04/03/2015   Chest pain 03/07/2015   Gastroesophageal reflux disease without esophagitis 03/07/2015   Asthma 03/07/2015   Fibromyalgia 03/07/2015   Sepsis (HCC) 03/05/2014   CAP (community acquired pneumonia) 03/05/2014   Incidental pulmonary nodule 12/11/2011   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      Home Medications    Prior to Admission medications   Medication Sig Start Date End Date Taking? Authorizing Provider  acetaminophen (TYLENOL) 650 MG CR tablet Take by mouth.     [provider]  ALPRAZolam Prudy Feeler(XANAX) 1 MG tablet Take by mouth.    [provider]  azelastine (ASTELIN) 0.1 % nasal spray Take 2 sprays per nostril twice daily as needed for runny nose 06/22/18   Fletcher AnonBardelas, Jose A, MD  beclomethasone (QVAR) 40 MCG/ACT inhaler Inhale into the lungs. 07/24/16   [provider]  benzonatate (TESSALON) 100 MG capsule Take 100 mg by mouth 3 (three) times daily as needed for cough.    [provider]  cyclobenzaprine (FLEXERIL) 10 MG tablet Take 10 mg by mouth.    [provider]  diclofenac sodium (VOLTAREN) 1 % GEL APPLY AS DIRECTED 09/13/14   [provider]  dicyclomine (BENTYL) 20 MG tablet Take 1 tablet (20 mg total) by mouth 2 (two) times daily. 02/22/19   Palumbo, April, MD  fexofenadine (ALLEGRA) 180 MG tablet Take 1 tablet (180 mg total) by mouth daily. 06/22/18   Fletcher AnonBardelas, Jose A, MD  fluticasone (FLONASE) 50 MCG/ACT nasal spray Place 2 sprays into both nostrils daily as needed (for stuffy nose). 06/22/18   Fletcher AnonBardelas, Jose A, MD  fluticasone (FLOVENT HFA) 44 MCG/ACT inhaler Inhale 2 puffs into the lungs 2 (two) times daily. Patient not taking: Reported on 06/22/2018 05/11/18   Fletcher AnonBardelas, Jose A, MD  hydroxypropyl methylcellulose (ISOPTO TEARS) 2.5 % ophthalmic solution 1 drop. 12/14/14   [provider]  hyoscyamine (LEVSIN, ANASPAZ) 0.125 MG tablet Take by mouth.    [provider]  Lactobacillus Rhamnosus, GG, (CULTURELLE PO) Take by mouth.    [provider]  levalbuterol Pauline Aus(XOPENEX HFA) 45 MCG/ACT inhaler Inhale 2 puffs into the lungs every 4 (four) hours as needed for wheezing. 06/22/18   Fletcher AnonBardelas, Jose A, MD  levalbuterol (XOPENEX) 1.25 MG/3ML nebulizer solution Take 1.25 mg by nebulization every 6 (six) hours as needed for wheezing or shortness of breath. 06/22/18   Fletcher AnonBardelas, Jose A, MD  montelukast (SINGULAIR) 10 MG tablet Take 1 tablet (10 mg total) by mouth at bedtime. 06/22/18    Fletcher AnonBardelas, Jose A, MD  Multiple Vitamin (MULTI-VITAMINS) TABS Take by mouth.    [provider]  naproxen (NAPROSYN) 500 MG tablet Take 1 tablet (500 mg total) by mouth 2 (two) times daily. 03/28/19   Aanyah Loa, Mayer Maskerourtney F, MD  oxyCODONE-acetaminophen (PERCOCET/ROXICET) 5-325 MG tablet Take by mouth.    [provider]  phentermine (ADIPEX-P) 37.5 MG tablet Take 1/2 tab.at breakfast and 1/2 tab. after lunch 06/01/18   [provider]  polyvinyl alcohol (LIQUIFILM TEARS) 1.4 % ophthalmic solution Administer 1 drop to both eyes Three (3) times a day as needed for dry eyes.    [provider]  potassium chloride SA (K-DUR,KLOR-CON) 20 MEQ tablet Take 20 mEq by mouth daily. 01/17/14   [provider]  promethazine (PHENERGAN) 25 MG tablet  04/14/18   [provider]  Spacer/Aero-Holding Chambers (AEROCHAMBER W/FLOWSIGNAL) inhaler Use as directed with inhaler 05/11/18   [provider]  triamcinolone cream (KENALOG) 0.1 % Apply topically. 07/20/15   [provider]  valACYclovir (VALTREX) 1000 MG  tablet Take by mouth. 06/06/18   [provider]  vitamin B-12 (CYANOCOBALAMIN) 100 MCG tablet Take by mouth. 12/01/08   [provider]    Family History Family History  Problem Relation Age of Onset   Asthma Mother    COPD Mother        smoked   Rheum arthritis Mother    Skin cancer Mother     Social History Social History   Tobacco Use   Smoking status: Never Smoker   Smokeless tobacco: Never Used  Substance Use Topics   Alcohol use: No    Alcohol/week: 0.0 standard drinks   Drug use: No     Allergies   Budesonide-formoterol fumarate, Rocephin [ceftriaxone sodium in dextrose], Avandia [rosiglitazone], Flagyl [metronidazole], Metformin, Tramadol hcl, Valdecoxib, Vancomycin, Amoxicillin, Atorvastatin, Avelox [moxifloxacin hcl in nacl], Baclofen, Cefdinir, Ceftin [cefuroxime axetil], Celebrex [celecoxib],  Ciprofloxacin, Clarithromycin, Clindamycin, Clindamycin/lincomycin, Doxycycline, Erythromycin base, Latex, Levaquin [levofloxacin in d5w], Levofloxacin, Macrobid [nitrofurantoin monohyd macro], Macrodantin [nitrofurantoin macrocrystal], Metformin and related, Mirtazapine, Neurontin [gabapentin], Nitrofurantoin, Propoxyphene, Sulfa antibiotics, Tramadol hcl, Ultram [tramadol], Vioxx [rofecoxib], Voltaren [diclofenac sodium], Ertapenem, Other, Sulfacetamide sodium, and Tape   Review of Systems Review of Systems  Constitutional: Negative for fever.  HENT: Positive for sneezing.   Respiratory: Positive for cough and shortness of breath.   Cardiovascular: Positive for chest pain. Negative for leg swelling.  Gastrointestinal: Positive for diarrhea and nausea. Negative for abdominal pain and vomiting.  Genitourinary: Negative for dysuria.  All other systems reviewed and are negative.    Physical Exam Updated Vital Signs BP (!) 136/96 (BP Location: Right Arm)    Pulse 76    Temp 98.2 F (36.8 C) (Oral)    Resp 18    Ht 1.6 m (5\' 3" )    Wt 83 kg    SpO2 100%    BMI 32.42 kg/m   Physical Exam Vitals signs and nursing note reviewed.  Constitutional:      Appearance: She is well-developed. She is not ill-appearing.  HENT:     Head: Normocephalic and atraumatic.  Eyes:     Pupils: Pupils are equal, round, and reactive to light.  Cardiovascular:     Rate and Rhythm: Normal rate and regular rhythm.     Heart sounds: Normal heart sounds.  Pulmonary:     Effort: Pulmonary effort is normal. No respiratory distress.     Breath sounds: No wheezing.  Chest:     Chest wall: Tenderness present.     Comments: Tenderness palpation along the left chest, no crepitus or overlying skin changes Abdominal:     General: Bowel sounds are normal.     Palpations: Abdomen is soft.  Musculoskeletal:     Right lower leg: She exhibits no tenderness. No edema.     Left lower leg: She exhibits no tenderness. No  edema.  Skin:    General: Skin is warm and dry.  Neurological:     Mental Status: She is alert and oriented to person, place, and time.  Psychiatric:        Mood and Affect: Mood normal.      ED Treatments / Results  Labs (all labs ordered are listed, but only abnormal results are displayed) Labs Reviewed  SARS CORONAVIRUS 2 (HOSP ORDER, PERFORMED IN [facility] VIA ABBOTT ID)  CBC WITH DIFFERENTIAL/PLATELET  BASIC METABOLIC PANEL    EKG EKG Interpretation  Date/Time:  Sunday March 27 2019 21:27:04 EDT Ventricular Rate:  96 PR Interval:  178  QRS Duration: 78 QT Interval:  358 QTC Calculation: 452 R Axis:   84 Text Interpretation:  Normal sinus rhythm Normal ECG Confirmed by Thayer Jew [phone]) on 03/27/2019 11:56:41 PM   Radiology Dg Chest Portable 1 View  Result Date: 03/28/2019 CLINICAL DATA:  58 y/o  F; cough, headache, and sneezing for 6 days. EXAM: PORTABLE CHEST 1 VIEW COMPARISON:  12/09/2017 chest radiograph FINDINGS: Stable heart size and mediastinal contours are within normal limits. Both lungs are clear. The visualized skeletal structures are unremarkable. IMPRESSION: No active disease. Electronically Signed   By: Kristine Garbe M.D.   On: 03/28/2019 00:53    Procedures Procedures (including critical care time)  Medications Ordered in ED Medications  diazepam (VALIUM) tablet 5 mg (5 mg Oral Given 03/28/19 0111)  ondansetron (ZOFRAN-ODT) disintegrating tablet 4 mg (4 mg Oral Given 03/28/19 0111)     Initial Impression / Assessment and Plan / ED Course  I have reviewed the triage vital signs and the nursing notes.  Pertinent labs & imaging results that were available during my care of the patient were reviewed by me and considered in my medical decision making (see chart for details).        Patient presents with multiple complaints.  She is overall nontoxic-appearing and vital signs are reassuring.  She is afebrile.  She is concerned  that she was previously infected with COVID.  She reports return of symptoms and now has chest pain.  Chest pain is very atypical and low suspicion for ACS given reproducible nature.  EKG shows no evidence of acute ischemia or arrhythmia.  Chest x-ray shows no evidence  of pneumothorax or pneumonia.  Basic lab work is reassuring.  Patient has multiple medications at home including muscle relaxants and narcotic pain medications.  We will add anti-inflammatories and have her follow-up closely with her primary physician.   Holly Rogers was evaluated in Emergency Department on 03/28/2019 for the symptoms described in the history of present illness. She was evaluated in the context of the global COVID-19 pandemic, which necessitated consideration that the patient might be at risk for infection with the SARS-CoV-2 virus that causes COVID-19. Institutional protocols and algorithms that pertain to the evaluation of patients at risk for COVID-19 are in a state of rapid change based on information released by regulatory bodies including the CDC and federal and state organizations. These policies and algorithms were followed during the patient's care in the ED.  After history, exam, and medical workup I feel the patient has been appropriately medically screened and is safe for discharge home. Pertinent diagnoses were discussed with the patient. Patient was given return precautions.   Final Clinical Impressions(s) / ED Diagnoses   Final diagnoses:  Atypical chest pain  Viral upper respiratory tract infection    ED Discharge Orders         Ordered    naproxen (NAPROSYN) 500 MG tablet  2 times daily     03/28/19 0216           Royce Stegman, Barbette Hair, MD 03/28/19 [id number]    Merryl Hacker, MD 03/28/19 [phone]

## 2019-06-21 ENCOUNTER — Ambulatory Visit: Payer: Medicare Other | Admitting: Pediatrics

## 2019-07-04 ENCOUNTER — Ambulatory Visit: Payer: Medicare Other | Admitting: Pediatrics

## 2019-07-12 ENCOUNTER — Other Ambulatory Visit: Payer: Self-pay

## 2019-07-12 ENCOUNTER — Emergency Department (HOSPITAL_BASED_OUTPATIENT_CLINIC_OR_DEPARTMENT_OTHER)
Admission: EM | Admit: 2019-07-12 | Discharge: 2019-07-12 | Disposition: A | Discharge status: Home / Self Care | Payer: Medicare Other | Attending: Emergency Medicine | Admitting: Emergency Medicine

## 2019-07-12 ENCOUNTER — Encounter (HOSPITAL_BASED_OUTPATIENT_CLINIC_OR_DEPARTMENT_OTHER): Payer: Self-pay

## 2019-07-12 ENCOUNTER — Emergency Department (HOSPITAL_BASED_OUTPATIENT_CLINIC_OR_DEPARTMENT_OTHER): Payer: Medicare Other

## 2019-07-12 DIAGNOSIS — Z91040 Latex allergy status: Secondary | ICD-10-CM | POA: Diagnosis not present

## 2019-07-12 DIAGNOSIS — Z20828 Contact with and (suspected) exposure to other viral communicable diseases: Secondary | ICD-10-CM | POA: Diagnosis not present

## 2019-07-12 DIAGNOSIS — J45909 Unspecified asthma, uncomplicated: Secondary | ICD-10-CM | POA: Insufficient documentation

## 2019-07-12 DIAGNOSIS — J449 Chronic obstructive pulmonary disease, unspecified: Secondary | ICD-10-CM | POA: Insufficient documentation

## 2019-07-12 DIAGNOSIS — R079 Chest pain, unspecified: Secondary | ICD-10-CM | POA: Insufficient documentation

## 2019-07-12 DIAGNOSIS — R5383 Other fatigue: Secondary | ICD-10-CM | POA: Insufficient documentation

## 2019-07-12 DIAGNOSIS — E119 Type 2 diabetes mellitus without complications: Secondary | ICD-10-CM | POA: Diagnosis not present

## 2019-07-12 DIAGNOSIS — Z79899 Other long term (current) drug therapy: Secondary | ICD-10-CM | POA: Diagnosis not present

## 2019-07-12 LAB — URINALYSIS, ROUTINE W REFLEX MICROSCOPIC
Bilirubin Urine: NEGATIVE
Glucose, UA: NEGATIVE mg/dL
Hgb urine dipstick: NEGATIVE
Ketones, ur: NEGATIVE mg/dL
Leukocytes,Ua: NEGATIVE
Nitrite: NEGATIVE
Protein, ur: NEGATIVE mg/dL
Specific Gravity, Urine: 1.01 (ref 1.005–1.030)
pH: 5.5 (ref 5.0–8.0)

## 2019-07-12 LAB — CBC
HCT: 35.1 % — ABNORMAL LOW (ref 36.0–46.0)
Hemoglobin: 11.3 g/dL — ABNORMAL LOW (ref 12.0–15.0)
MCH: 31.1 pg (ref 26.0–34.0)
MCHC: 32.2 g/dL (ref 30.0–36.0)
MCV: 96.7 fL (ref 80.0–100.0)
Platelets: 225 10*3/uL (ref 150–400)
RBC: 3.63 MIL/uL — ABNORMAL LOW (ref 3.87–5.11)
RDW: 11.9 % (ref 11.5–15.5)
WBC: 4.7 10*3/uL (ref 4.0–10.5)
nRBC: 0 % (ref 0.0–0.2)

## 2019-07-12 LAB — BASIC METABOLIC PANEL
Anion gap: 5 (ref 5–15)
BUN: 9 mg/dL (ref 6–20)
CO2: 24 mmol/L (ref 22–32)
Calcium: 9.1 mg/dL (ref 8.9–10.3)
Chloride: 110 mmol/L (ref 98–111)
Creatinine, Ser: 0.71 mg/dL (ref 0.44–1.00)
GFR calc Af Amer: >60 mL/min (ref >60)
GFR calc non Af Amer: >60 mL/min (ref >60)
Glucose, Bld: 81 mg/dL (ref 70–99)
Potassium: 3.9 mmol/L (ref 3.5–5.1)
Sodium: 139 mmol/L (ref 135–145)

## 2019-07-12 LAB — TROPONIN I (HIGH SENSITIVITY): Troponin I (High Sensitivity): 2 ng/L (ref <18)

## 2019-07-12 LAB — HEPATIC FUNCTION PANEL
ALT: 28 U/L (ref 0–44)
AST: 27 U/L (ref 15–41)
Albumin: 4 g/dL (ref 3.5–5.0)
Alkaline Phosphatase: 91 U/L (ref 38–126)
Bilirubin, Direct: 0.1 mg/dL (ref 0.0–0.2)
Indirect Bilirubin: 0.3 mg/dL (ref 0.3–0.9)
Total Bilirubin: 0.4 mg/dL (ref 0.3–1.2)
Total Protein: 6.4 g/dL — ABNORMAL LOW (ref 6.5–8.1)

## 2019-07-12 LAB — LIPASE, BLOOD: Lipase: 27 U/L (ref 11–51)

## 2019-07-12 MED ORDER — SODIUM CHLORIDE 0.9% FLUSH
3.0000 mL | Freq: Once | INTRAVENOUS | Status: DC
  Filled 2019-07-12: qty 3

## 2019-07-12 NOTE — ED Triage Notes (Signed)
Pt c/o CP x "months"-worse x 3 days-states she was seen by cards yesterday and by PCP last week-NAD-steady gait

## 2019-07-12 NOTE — ED Provider Notes (Signed)
[facility] EMERGENCY DEPARTMENT Provider Note   CSN: [id number] Arrival date & time: 07/12/19  1943     History   Chief Complaint Chief Complaint  Patient presents with  . Chest Pain    HPI Holly Rogers is a 58 y.o. female.     HPI   Over the weekend began to have chest pain, was sweeping and mopping/doing laundry, worse with moving arm. Burning and stinging pain, nagging, won't go away since the weekend.  8/10.  Headache coming and going, generalized weakness.  Nausea, no vomiting.  Fatigue.   Arnette Felts heart doctor with [facility] saw him yesterday, told him he needed to be on cholesterol medication, last few times she has been there BP has been high, told him she couldn't take propanolol  Heart pain for several months Previously on  propanolol, passed out, had fell asleep without warning, took it for a few weeks, then stopped, began it about month ago Told PCP she needs xanax to sleep because it is short term, doesn't feel groggy the next day  Going through a lot of stress, worrying about brother, broke up with boyfriend of greater than 13 years one year ago.   Reports chest pain coming on long enough to get her attention then improves, thinks it could be from stress Trying to get on cholesterol medications  Hx of fatty liver, then noted to have liver cyst Wondering if sarcoidosis bothering her, did have some kidney infection symptoms had had urine tested at [facility] but haven't heard, is having some right flank pain  A lot of stress, a lot going on, doesn't feel therapist has really been talking to her, hasn't getting along with her daughter   Started vascepa for triglycerides, bisystolic, valium  Past Medical History:  Diagnosis Date  . Acid reflux   . Arthritis   . COPD (chronic obstructive pulmonary disease) (HCC)   . COPD (chronic obstructive pulmonary disease) (HCC)   . DDD (degenerative disc disease), lumbar   . DDD (degenerative disc disease),  lumbar   . Diabetes mellitus without complication (HCC)   . Diverticulitis   . Diverticulosis   . Fibromyalgia   . Functional abdominal pain syndrome   . Mitral valve prolapse   . Pancreatitis 1997  . Pneumonia   . Sarcoidosis   . Sepsis (HCC)   . Sepsis [facility])     Patient Active Problem List   Diagnosis Date Noted  . Allergy 06/22/2018  . Hyperalgesia 06/22/2018  . MVP (mitral valve prolapse) 06/22/2018  . Temporomandibular joint disorder 06/22/2018  . Sarcoid 06/22/2018  . Bacterial vaginitis 06/04/2018  . New daily persistent headache 06/04/2018  . Vulvar fissure 06/04/2018  . Acute vaginitis 04/26/2018  . Mild persistent asthma, uncomplicated 08/03/2017  . Possible exposure to STD 11/11/2016  . Chronic bronchitis (HCC) 04/10/2016  . Chronic fatigue syndrome 04/10/2016  . DDD (degenerative disc disease), lumbar 04/10/2016  . Clostridium difficile infection 03/08/2016  . Sigmoid diverticulosis 03/06/2016  . Urinary incontinence 10/30/2015  . Sinusitis 10/20/2015  . Anxiety 10/20/2015  . History of Clostridium difficile colitis 10/20/2015  . Hypoglycemia 10/20/2015  . Type 2 diabetes mellitus without complication, without long-term current use of insulin (HCC) 10/20/2015  . Diarrhea 10/19/2015  . Diverticulosis 10/18/2015  . Diverticulitis of large intestine 10/18/2015  . Hypokalemia 10/18/2015  . Rectal bleed 10/18/2015  . Chronic neck pain 09/27/2015  . Mild persistent asthma with exacerbation 06/15/2015  . Other allergic rhinitis 06/15/2015  . IBS (irritable  bowel syndrome) 05/15/2015  . History of nonmelanoma skin cancer 04/03/2015  . Chest pain 03/07/2015  . Gastroesophageal reflux disease without esophagitis 03/07/2015  . Asthma 03/07/2015  . Fibromyalgia 03/07/2015  . Sepsis (HCC) 03/05/2014  . CAP (community acquired pneumonia) 03/05/2014  . Incidental pulmonary nodule 12/11/2011  . 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  Diverticulitis 09/19/2009  . Myalgia and myositis, unspecified 09/19/2009    Past Surgical History:  Procedure Laterality Date  . ABDOMINAL HYSTERECTOMY    . CHOLECYSTECTOMY       OB History   No obstetric history on file.      Home Medications    Prior to Admission medications   Medication Sig Start Date End Date Taking? Authorizing Provider  acetaminophen (TYLENOL) 650 MG CR tablet Take by mouth.    [provider]  ALPRAZolam Prudy Feeler(XANAX) 1 MG tablet Take by mouth.    [provider]  azelastine (ASTELIN) 0.1 % nasal spray Take 2 sprays per nostril twice daily as needed for runny nose 06/22/18   Fletcher AnonBardelas, Jose A, MD  beclomethasone (QVAR) 40 MCG/ACT inhaler Inhale into the lungs. 07/24/16   [provider]  benzonatate (TESSALON) 100 MG capsule Take 100 mg by mouth 3 (three) times daily as needed for cough.    [provider]  cyclobenzaprine (FLEXERIL) 10 MG tablet Take 10 mg by mouth.    [provider]  diclofenac sodium (VOLTAREN) 1 % GEL APPLY AS DIRECTED 09/13/14   [provider]  dicyclomine (BENTYL) 20 MG tablet Take 1 tablet (20 mg total) by mouth 2 (two) times daily. 02/22/19   Palumbo, April, MD  fexofenadine (ALLEGRA) 180 MG tablet Take 1 tablet (180 mg total) by mouth daily. 06/22/18   Fletcher AnonBardelas, Jose A, MD  fluticasone (FLONASE) 50 MCG/ACT nasal spray Place 2 sprays into both nostrils daily as needed (for stuffy nose). 06/22/18   Fletcher AnonBardelas, Jose A, MD  fluticasone (FLOVENT HFA) 44 MCG/ACT inhaler Inhale 2 puffs into the lungs 2 (two) times daily. Patient not taking: Reported on 06/22/2018 05/11/18   Fletcher AnonBardelas, Jose A, MD  hydroxypropyl methylcellulose (ISOPTO TEARS) 2.5 % ophthalmic solution 1 drop. 12/14/14   [provider]  hyoscyamine (LEVSIN, ANASPAZ) 0.125 MG tablet Take by mouth.    [provider]  Lactobacillus Rhamnosus, GG, (CULTURELLE PO) Take by mouth.    [provider]  levalbuterol  Pauline Aus(XOPENEX HFA) 45 MCG/ACT inhaler Inhale 2 puffs into the lungs every 4 (four) hours as needed for wheezing. 06/22/18   Fletcher AnonBardelas, Jose A, MD  levalbuterol (XOPENEX) 1.25 MG/3ML nebulizer solution Take 1.25 mg by nebulization every 6 (six) hours as needed for wheezing or shortness of breath. 06/22/18   Fletcher AnonBardelas, Jose A, MD  montelukast (SINGULAIR) 10 MG tablet Take 1 tablet (10 mg total) by mouth at bedtime. 06/22/18   Fletcher AnonBardelas, Jose A, MD  Multiple Vitamin (MULTI-VITAMINS) TABS Take by mouth.    [provider]  naproxen (NAPROSYN) 500 MG tablet Take 1 tablet (500 mg total) by mouth 2 (two) times daily. 03/28/19   Horton, Mayer Maskerourtney F, MD  oxyCODONE-acetaminophen (PERCOCET/ROXICET) 5-325 MG tablet Take by mouth.    [provider]  phentermine (ADIPEX-P) 37.5 MG tablet Take 1/2 tab.at breakfast and 1/2 tab. after lunch 06/01/18   [provider]  polyvinyl alcohol (LIQUIFILM TEARS) 1.4 % ophthalmic solution Administer 1 drop to both eyes Three (3) times a day as needed for dry eyes.  [provider]  potassium chloride SA (K-DUR,KLOR-CON) 20 MEQ tablet Take 20 mEq by mouth daily. 01/17/14   [provider]  promethazine (PHENERGAN) 25 MG tablet  04/14/18   [provider]  Spacer/Aero-Holding Chambers (AEROCHAMBER W/FLOWSIGNAL) inhaler Use as directed with inhaler 05/11/18   [provider]  triamcinolone cream (KENALOG) 0.1 % Apply topically. 07/20/15   [provider]  valACYclovir (VALTREX) 1000 MG tablet Take by mouth. 06/06/18   [provider]  vitamin B-12 (CYANOCOBALAMIN) 100 MCG tablet Take by mouth. 12/01/08   [provider]    Family History Family History  Problem Relation Age of Onset  . Asthma Mother   . COPD Mother        smoked  . Rheum arthritis Mother   . Skin cancer Mother     Social History Social History   Tobacco Use  . Smoking status: Never Smoker  . Smokeless tobacco: Never Used   Substance Use Topics  . Alcohol use: No    Alcohol/week: 0.0 standard drinks  . Drug use: No     Allergies   Budesonide-formoterol fumarate, Rocephin [ceftriaxone sodium in dextrose], Avandia [rosiglitazone], Flagyl [metronidazole], Metformin, Tramadol hcl, Valdecoxib, Vancomycin, Amoxicillin, Atorvastatin, Avelox [moxifloxacin hcl in nacl], Baclofen, Cefdinir, Ceftin [cefuroxime axetil], Celebrex [celecoxib], Ciprofloxacin, Clarithromycin, Clindamycin, Clindamycin/lincomycin, Doxycycline, Erythromycin base, Latex, Levaquin [levofloxacin in d5w], Levofloxacin, Macrobid [nitrofurantoin monohyd macro], Macrodantin [nitrofurantoin macrocrystal], Metformin and related, Mirtazapine, Neurontin [gabapentin], Nitrofurantoin, Propoxyphene, Sulfa antibiotics, Tramadol hcl, Ultram [tramadol], Vioxx [rofecoxib], Voltaren [diclofenac sodium], Ertapenem, Other, Sulfacetamide sodium, and Tape   Review of Systems Review of Systems  Constitutional: Positive for fatigue. Negative for fever.  HENT: Negative for sore throat.   Eyes: Negative for visual disturbance.  Respiratory: Negative for cough and shortness of breath.   Cardiovascular: Positive for chest pain and leg swelling.  Gastrointestinal: Positive for nausea. Negative for abdominal pain, diarrhea and vomiting.  Genitourinary: Positive for flank pain. Negative for difficulty urinating, dysuria and frequency.  Musculoskeletal: Positive for back pain. Negative for neck pain.  Skin: Negative for rash.  Neurological: Positive for headaches. Negative for syncope.  Psychiatric/Behavioral: Positive for sleep disturbance.     Physical Exam Updated Vital Signs BP 97/65   Pulse 66   Temp 98.6 F (37 C) (Oral)   Resp 19   Ht 5\' 3"  (1.6 m)   Wt 87.1 kg   SpO2 100%   BMI 34.01 kg/m   Physical Exam Vitals signs and nursing note reviewed.  Constitutional:      General: She is not in acute distress.    Appearance: She is well-developed. She is not  diaphoretic.  HENT:     Head: Normocephalic and atraumatic.  Eyes:     Conjunctiva/sclera: Conjunctivae normal.  Neck:     Musculoskeletal: Normal range of motion.  Cardiovascular:     Rate and Rhythm: Normal rate and regular rhythm.     Heart sounds: Normal heart sounds. No murmur. No friction rub. No gallop.   Pulmonary:     Effort: Pulmonary effort is normal. No respiratory distress.     Breath sounds: Normal breath sounds. No wheezing or rales.  Abdominal:     General: There is no distension.     Palpations: Abdomen is soft.     Tenderness: There is abdominal tenderness (mild discomfort RUQ). There is no guarding.  Musculoskeletal:        General: No tenderness.  Skin:    General: Skin is warm and dry.  Findings: No erythema or rash.  Neurological:     Mental Status: She is alert and oriented to person, place, and time.      ED Treatments / Results  Labs (all labs ordered are listed, but only abnormal results are displayed) Labs Reviewed  CBC - Abnormal; Notable for the following components:      Result Value   RBC 3.63 (*)    Hemoglobin 11.3 (*)    HCT 35.1 (*)    All other components within normal limits  HEPATIC FUNCTION PANEL - Abnormal; Notable for the following components:   Total Protein 6.4 (*)    All other components within normal limits  URINE CULTURE  NOVEL CORONAVIRUS, NAA (HOSP ORDER, SEND-OUT TO REF LAB; TAT 18-24 HRS)  BASIC METABOLIC PANEL  URINALYSIS, ROUTINE W REFLEX MICROSCOPIC  LIPASE, BLOOD  TROPONIN I (HIGH SENSITIVITY)  TROPONIN I (HIGH SENSITIVITY)    EKG None  Radiology Dg Chest 2 View  Result Date: 07/12/2019 CLINICAL DATA:  58 year old female with chest pain. EXAM: CHEST - 2 VIEW COMPARISON:  Chest radiograph dated 03/28/2019 FINDINGS: The lungs are clear. There is no pleural effusion or pneumothorax. The cardiac silhouette is within normal limits. There is a small hiatal hernia. No acute osseous pathology. IMPRESSION: 1. No  active cardiopulmonary disease. 2. Small hiatal hernia. Electronically Signed   By: Anner Crete M.D.   On: 07/12/2019 20:19    Procedures Procedures (including critical care time)  Medications Ordered in ED Medications - No data to display   Initial Impression / Assessment and Plan / ED Course  I have reviewed the triage vital signs and the nursing notes.  Pertinent labs & imaging results that were available during my care of the patient were reviewed by me and considered in my medical decision making (see chart for details).        58 year old female with history of COPD, fibromyalgia, functional abdominal pain syndrome, mitral valve prolapse, sarcoidosis, type 2 diabetes, MVP, diverticulosis, presents with concern for chest pain, and also symptoms of fatigue, flank pain, and reports being under significant stress.  EKG shows no significant changes in comparison to prior, no signs of pericarditis.  Chest x-ray shows no signs of pneumonia, pneumothorax, or other abnormalities.  She has a upper and lower extremity pulses, history described is not consistent with dissection.  Not describing shortness of breath, has no asymmetric leg swelling, low suspicion for pulmonary embolus.  No signs of COPD exacerbation.  Troponin is negative, and after days of constant symptoms, low suspicion for ACS.  She had seen her cardiologist yesterday regarding the symptoms.  Overall, her chest pain is atypical, and does appear to have a musculoskeletal component, however given her risk factors, recommend continued follow-up with cardiology.  Reports she did have a normal nuclear stress test months ago.  Have low suspicion for acute intra-abdominal pathology on history and exam.  Labs do not show signs of acute hepatitis or pancreatitis.  Urine without evidence of urinary tract infection.  Given significant fatigue, nausea, decreased appetite, failed Covid testing is reasonable, this was ordered and sent.   Recommend continued follow-up with primary care physician and cardiology.   Final Clinical Impressions(s) / ED Diagnoses   Final diagnoses:  Nonspecific chest pain  Other fatigue    ED Discharge Orders    None       Gareth Morgan, MD 07/13/19 1256

## 2019-07-14 LAB — URINE CULTURE: Culture: <10000 — AB

## 2019-07-14 LAB — NOVEL CORONAVIRUS, NAA (HOSP ORDER, SEND-OUT TO REF LAB; TAT 18-24 HRS): SARS-CoV-2, NAA: NOT DETECTED

## 2020-03-08 ENCOUNTER — Other Ambulatory Visit: Payer: Self-pay | Admitting: Pediatrics

## 2020-05-17 ENCOUNTER — Ambulatory Visit: Payer: Medicare Other | Admitting: Allergy and Immunology

## 2020-06-06 ENCOUNTER — Encounter: Payer: Self-pay | Admitting: Family Medicine

## 2020-06-06 ENCOUNTER — Other Ambulatory Visit: Payer: Self-pay

## 2020-06-06 ENCOUNTER — Ambulatory Visit (INDEPENDENT_AMBULATORY_CARE_PROVIDER_SITE_OTHER): Payer: Medicare Other | Admitting: Family Medicine

## 2020-06-06 VITALS — BP 90/80 | HR 68 | Temp 98.2°F | Resp 16 | Ht 64.0 in | Wt 183.0 lb

## 2020-06-06 DIAGNOSIS — J4541 Moderate persistent asthma with (acute) exacerbation: Secondary | ICD-10-CM

## 2020-06-06 DIAGNOSIS — J3089 Other allergic rhinitis: Secondary | ICD-10-CM

## 2020-06-06 DIAGNOSIS — K219 Gastro-esophageal reflux disease without esophagitis: Secondary | ICD-10-CM | POA: Diagnosis not present

## 2020-06-06 DIAGNOSIS — D869 Sarcoidosis, unspecified: Secondary | ICD-10-CM | POA: Diagnosis not present

## 2020-06-06 MED ORDER — MONTELUKAST SODIUM 10 MG PO TABS: 10.0000 mg | ORAL_TABLET | Freq: Every day | ORAL | 5 refills | Status: AC

## 2020-06-06 NOTE — Progress Notes (Addendum)
[address] [place] [address] Dept: [phone]  FOLLOW UP NOTE  Patient ID: Holly Rogers, female    DOB: [birth year]  Age: 59 y.o. MRN: [id number] Date of Office Visit: 06/06/2020  Assessment  Chief Complaint: Asthma  HPI Holly Rogers is a 59 year old female who presents to the clinic for an evaluation of acute asthma exacerbation.  She was last seen in this clinic on 06/22/2018 by Dr. Beaulah Dinning for evaluation of asthma, sarcoid, allergic rhinitis, and reflux.  In the interim she notes that she has had Covid infection in February 2020.  She reports she has had Moderna COVID vaccinations on February 12 and March 12, respectively.  On Wednesday, she went to her primary doctor and received a prednisone taper for bilateral wheezing.  At today's visit, she reports for the last 3 years she has had trouble with her sinuses and upper respiratory infections due to perceived mold in her apartment.  Asthma is reported as not well controlled with shortness of breath with activity and rest, wheeze with activity, and dry cough for which she is taking Tessalon Perles.  She has a history of sarcoid and is followed by pulmonology at [facility].  She continues montelukast 10 mg daily and Xopenex.  She has used Flovent 110 in the past, however, she is not currently using this medication.  She does have it in her purse at this visit.  Imaging studies indicate that she had a chest x-ray on 05/30/2020 that was negative for cardiopulmonary disease as well as CT on 05/31/2020 that indicated " no evidence of interstitial or parenchymal lung disease". Allergic rhinitis is reported as poorly controlled with intermittent headache, sinus pressure, nasal congestion, clear rhinorrhea, sneezing, and postnasal drainage for which she continues Allegra 180 mg once a day.  She is interested in testing for current allergies, however, she has recently taken an oral antihistamine.  She is interested in discovering if she  currently has a mold allergy.  She is not currently using Flonase, azelastine, or saline nasal rinses.  Allergic conjunctivitis is reported as poorly controlled with symptoms including clear drainage, itching, and a burning feeling.  Reflux is reported as not well controlled with frequent heartburn for which she has recently started omeprazole.  Her current medications are listed in the chart.  Patient reports she is going for initial evaluation with [facility] next month.   Drug Allergies:  Allergies  Allergen Reactions   Budesonide-Formoterol Fumarate Palpitations and Other (See Comments)    Shakes Shakes Shaky    Rocephin [Ceftriaxone Sodium In Dextrose] Shortness Of Breath and Nausea And Vomiting   Avandia [Rosiglitazone] Other (See Comments)    Heart heart Chest pain   Flagyl [Metronidazole] Nausea And Vomiting and Other (See Comments)    Severe stomach pain   Metformin Other (See Comments)    Chest pain   Tramadol Hcl Other (See Comments)   Valdecoxib Other (See Comments)    Unknown Unknown   Vancomycin Nausea And Vomiting   Amoxicillin Nausea Only   Atorvastatin Other (See Comments)    Stomach pain    Avelox [Moxifloxacin Hcl In Nacl]     Patient is unsure what happens    Baclofen Nausea Only    Unknown, patient knows she couldn't take it   Cefdinir Other (See Comments)    No reaction listed. Ask pt and enter   Ceftin [Cefuroxime Axetil]     unknown   Celebrex [Celecoxib]     Chest  pain   Ciprofloxacin Nausea And Vomiting and Diarrhea   Clarithromycin     Stomach upset    Clindamycin Other (See Comments)    Cannot remember   Clindamycin/Lincomycin     unknown   Doxycycline     unknown   Erythromycin Base Other (See Comments)    Ask pt and enter   Latex Other (See Comments)    Ask pt and enter   Levaquin [Levofloxacin In D5w]     unknown   Levofloxacin Other (See Comments)   Macrobid [Nitrofurantoin Monohyd Macro]      unknown   Macrodantin [Nitrofurantoin Macrocrystal]    Metformin And Related     Chest pain   Mirtazapine Other (See Comments)    Couldn't sleep and legs cramping    Neurontin [Gabapentin]     Gave patient problems   Nitrofurantoin     unknown   Propoxyphene Nausea And Vomiting   Sulfa Antibiotics Nausea And Vomiting   Tramadol Hcl    Ultram [Tramadol] Other (See Comments)    Hallucination unknown   Vioxx [Rofecoxib] Other (See Comments)    Heart unknown   Voltaren [Diclofenac Sodium] Diarrhea    Unknown. But she can use the gel   Ertapenem Dermatitis   Other Nausea And Vomiting, Rash and Other (See Comments)    Darvocet Irritates skin   Sulfacetamide Sodium Nausea And Vomiting   Tape Rash and Other (See Comments)    Can use paper tape Irritates skin    Physical Exam: BP 90/80    Pulse 68    Temp 98.2 F (36.8 C) (Oral)    Resp 16    Ht 5\' 4"  (1.626 m)    Wt 183 lb (83 kg)    SpO2 95%    BMI 31.41 kg/m    Physical Exam Vitals reviewed.  Constitutional:      Appearance: Normal appearance.  HENT:     Head: Normocephalic and atraumatic.     Right Ear: Tympanic membrane normal.     Left Ear: Tympanic membrane normal.     Nose:     Comments: Bilateral nares slightly erythematous with clear nasal drainage noted.  Pharynx normal.  Ears normal.  Eyes normal.    Mouth/Throat:     Pharynx: Oropharynx is clear.  Eyes:     Conjunctiva/sclera: Conjunctivae normal.  Cardiovascular:     Rate and Rhythm: Normal rate and regular rhythm.     Heart sounds: Normal heart sounds. No murmur heard.   Pulmonary:     Effort: Pulmonary effort is normal.     Breath sounds: Normal breath sounds.     Comments: Lungs clear to auscultation Musculoskeletal:        General: Normal range of motion.     Cervical back: Normal range of motion and neck supple.  Skin:    General: Skin is warm and dry.  Neurological:     Mental Status: She is alert and oriented to person, place,  and time.  Psychiatric:        Mood and Affect: Mood normal.        Behavior: Behavior normal.        Judgment: Judgment normal.     Diagnostics: FVC 3.56, FEV1 2.71.  Predicted FVC 3.40, predicted FEV1 2.65.  Spirometry indicates normal ventilatory function.  Assessment and Plan: 1. Moderate persistent asthma with acute exacerbation   2. Sarcoid   3. Other allergic rhinitis   4. Gastroesophageal reflux disease without esophagitis  Meds ordered this encounter  Medications   montelukast (SINGULAIR) 10 MG tablet    Sig: Take 1 tablet (10 mg total) by mouth at bedtime.    Dispense:  30 tablet    Refill:  5    KEEP RX ON FILE.  PATIENT WILL CALL FOR FILL.    Patient Instructions  Asthma Begin Alvesco 80-2 puffs twice a day to prevent cough or wheeze Begin prednisone 10 mg tablets. Take 2 tablets once a day for the next 4 days, then take 1 tablet on the 5th day, then stop Continue montelukast 10 mg once a day to prevent cough or wheeze Continue Xopenex 2 puffs once every 6 hours as needed for cough or wheeze Keep your appointment with [facility] Call the clinic if you develop a fever or your symptoms worsen. Call 911 if you develop respiratory distress  Allergic rhinitis Stop Allegra for 1 week Stop Tylenol Cold and Flu Begin Mucinex 1200 mg twice a day to thin mucus secretions Begin Flonase 2 sprays in each nostril once a day for a stuffy nose.  In the right nostril, point the applicator out toward the right ear. In the left nostril, point the applicator out toward the left ear Begin saline nasal rinses as needed for nasal symptoms. Use this before any medicated nasal sprays for best result Continue azelastine 2 sprays in each nostril twice a day for a runny nose or sinus headache We will order a lab to evaluate your environmental allergies. We will call you when the results become available. At that time, we can discuss if allergy injections would be of  benefit to you.  Reflux Continue omeprazole 40 mg once aday as previously prescribed Continue dietary and lifestyle modifications as listed below Continue to follow up with your gastroenterologist  Call the clinic if this treatment plan is not working well for you  Follow up in 1 month or sooner if needed.   Return in about 4 weeks (around 07/04/2020), or if symptoms worsen or fail to improve.    Thank you for the opportunity to care for this patient.  Please do not hesitate to contact me with questions.  Thermon Leyland, FNP [facility]  ________________________________________________  I have provided oversight concerning [facility]'s evaluation and treatment of this patient's health issues addressed during today's encounter.  I agree with the assessment and therapeutic plan as outlined in the note.   Signed,   R Jorene Guest, MD

## 2020-06-06 NOTE — Patient Instructions (Addendum)
Asthma Begin Alvesco 80-2 puffs twice a day to prevent cough or wheeze Begin prednisone 10 mg tablets. Take 2 tablets once a day for the next 4 days, then take 1 tablet on the 5th day, then stop Continue montelukast 10 mg once a day to prevent cough or wheeze Continue Xopenex 2 puffs once every 6 hours as needed for cough or wheeze Keep your appointment with [facility] Call the clinic if you develop a fever or your symptoms worsen. Call 911 if you develop respiratory distress  Allergic rhinitis Stop Allegra for 1 week Stop Tylenol Cold and Flu Begin Mucinex 1200 mg twice a day to thin mucus secretions Begin Flonase 2 sprays in each nostril once a day for a stuffy nose.  In the right nostril, point the applicator out toward the right ear. In the left nostril, point the applicator out toward the left ear Begin saline nasal rinses as needed for nasal symptoms. Use this before any medicated nasal sprays for best result Continue azelastine 2 sprays in each nostril twice a day for a runny nose or sinus headache We will order a lab to evaluate your environmental allergies. We will call you when the results become available. At that time, we can discuss if allergy injections would be of benefit to you.  Reflux Continue omeprazole 40 mg once aday as previously prescribed Continue dietary and lifestyle modifications as listed below Continue to follow up with your gastroenterologist  Call the clinic if this treatment plan is not working well for you  Follow up in 1 month or sooner if needed.

## 2020-06-07 ENCOUNTER — Telehealth: Payer: Self-pay

## 2020-06-07 NOTE — Telephone Encounter (Signed)
Ambs, Norvel Richards, FNP  [facility] Can you please call this patient and ask how she is breathing? Please ask if she has been using Alvesco and how that is going for her. Thank you    Tried calling pt number is not working listed in pts chart

## 2020-06-08 NOTE — Telephone Encounter (Signed)
Thank you :)

## 2020-06-11 NOTE — Telephone Encounter (Signed)
Tried calling pt again still no service

## 2020-06-13 ENCOUNTER — Other Ambulatory Visit: Payer: Self-pay

## 2020-06-13 ENCOUNTER — Ambulatory Visit (INDEPENDENT_AMBULATORY_CARE_PROVIDER_SITE_OTHER): Payer: Medicare Other | Admitting: Allergy and Immunology

## 2020-06-13 ENCOUNTER — Encounter: Payer: Self-pay | Admitting: Allergy and Immunology

## 2020-06-13 VITALS — BP 100/68 | HR 78 | Temp 97.6°F | Resp 20

## 2020-06-13 DIAGNOSIS — J3089 Other allergic rhinitis: Secondary | ICD-10-CM

## 2020-06-13 DIAGNOSIS — K219 Gastro-esophageal reflux disease without esophagitis: Secondary | ICD-10-CM | POA: Diagnosis not present

## 2020-06-13 DIAGNOSIS — J4541 Moderate persistent asthma with (acute) exacerbation: Secondary | ICD-10-CM | POA: Diagnosis not present

## 2020-06-13 MED ORDER — SPIRIVA RESPIMAT 1.25 MCG/ACT IN AERS: INHALATION_SPRAY | RESPIRATORY_TRACT | 5 refills | Status: AC

## 2020-06-13 NOTE — Assessment & Plan Note (Addendum)
Based upon the history, minimizing exposure to mold may be beneficial. The patient is unable to tolerate ICS/LABAs due to tachycardia and tremors.    Prednisone has been provided, 20 mg x 4 days, 10 mg x1 day, then stop.  For now, continue Alvesco 80 g, 2 inhalations twice daily.  To maximize pulmonary deposition, a spacer has been provided along with instructions for its proper administration with an HFA inhaler.  A prescription has been provided for Spiriva Respimat 1.25 g, 2 inhalations daily.  Continue albuterol HFA, 1 to 2 inhalations every 4-6 hours if needed.  Subjective and objective measures of pulmonary function will be followed and the treatment plan will be adjusted accordingly.

## 2020-06-13 NOTE — Assessment & Plan Note (Signed)
   Continue appropriate reflux lifestyle modifications.  Continue omeprazole 40 mg daily as previously prescribed.  Follow-up with your gastroenterologist as recommended.

## 2020-06-13 NOTE — Progress Notes (Signed)
Follow-up Note  RE: Holly Rogers MRN: [id number] DOB: 1961-04-15 Date of Office Visit: 06/13/2020  Primary care provider: Karle Plumber, MD Referring provider: Karle Plumber, MD  History of present illness: Holly Rogers is a 59 y.o. female with persistent asthma, allergic rhinitis, gastroesophageal reflux presenting today for sick visit.  She was last seen in this clinic on June 06, 2020 by Thermon Leyland, FNP. She reports that she has been experiencing shortness of breath, wheezing, chest tightness, and coughing. In addition, she complains of a burning sensation in her chest. She believes that these asthma symptoms have been triggered by mold exposure/water damage in her apartment that occurred 3 years ago. She reports that she has felt so sick that she has "literally been on (her) death bed." She also complains of "sinus infections repeatedly year around." When she went on a beach vacation her upper and lower respiratory symptoms improved, only to recur when she returned to her apartment. She is currently taking Alvesco 80 mcg, 2 inhalations twice a day. She is not using a spacer device. She is unable to tolerate LABA because of racing heart and jittery hands. Her reflux is stable while taking omeprazole daily.  Assessment and plan: Moderate persistent asthma with acute exacerbation Based upon the history, minimizing exposure to mold may be beneficial. The patient is unable to tolerate ICS/LABAs due to tachycardia and tremors.    Prednisone has been provided, 20 mg x 4 days, 10 mg x1 day, then stop.  For now, continue Alvesco 80 g, 2 inhalations twice daily.  To maximize pulmonary deposition, a spacer has been provided along with instructions for its proper administration with an HFA inhaler.  A prescription has been provided for Spiriva Respimat 1.25 g, 2 inhalations daily.  Continue albuterol HFA, 1 to 2 inhalations every 4-6 hours if needed.  Subjective and objective  measures of pulmonary function will be followed and the treatment plan will be adjusted accordingly.  Other allergic rhinitis  Continue appropriate allergen avoidance measures.  A prescription has been provided for azelastine/fluticasone nasal spray, 1 spray per nostril twice daily as needed. Proper nasal spray technique has been discussed and demonstrated.  Nasal saline lavage (NeilMed) has been recommended as needed and prior to medicated nasal sprays along with instructions for proper administration.  For thick post nasal drainage, add guaifenesin [phone] mg (Mucinex)  twice daily as needed with adequate hydration as discussed.  Gastroesophageal reflux disease without esophagitis  Continue appropriate reflux lifestyle modifications.  Continue omeprazole 40 mg daily as previously prescribed.  Follow-up with your gastroenterologist as recommended.   Meds ordered this encounter  Medications  . Tiotropium Bromide Monohydrate (SPIRIVA RESPIMAT) 1.25 MCG/ACT AERS    Sig: Take 2 puffs once daily for cough or wheeze.    Dispense:  1 each    Refill:  5    Diagnostics: Spirometry: FVC was 3.61 L and FEV1 was 2.74 L with 90 mL post-bronchodilator improvement. This study was performed while the patient was asymptomatic. Please see scanned spirometry for details.    Physical examination: Blood pressure 100/68, pulse 78, temperature 97.6 F (36.4 C), resp. rate 20, SpO2 97 %.  General: Alert, interactive, in no acute distress. HEENT: TMs pearly gray, turbinates moderately edematous without discharge, post-pharynx moderately erythematous. Neck: Supple without lymphadenopathy. Lungs: Clear to auscultation without wheezing, rhonchi or rales. CV: Normal S1, S2 without murmurs. Skin: Warm and dry, without lesions or rashes.  The following portions of the patient's history were  reviewed and updated as appropriate: allergies, current medications, past family history, past medical history,  past social history, past surgical history and problem list.   Current Outpatient Medications  Medication Sig Dispense Refill  . atorvastatin (LIPITOR) 20 MG tablet Take 20 mg by mouth daily.    Marland Kitchen azelastine (ASTELIN) 0.1 % nasal spray Take 2 sprays per nostril twice daily as needed for runny nose 30 mL 5  . benzonatate (TESSALON) 100 MG capsule Take 100 mg by mouth 3 (three) times daily as needed for cough.    . Cholecalciferol (VITAMIN D-3 PO) Take by mouth.    . ciclesonide (ALVESCO) 80 MCG/ACT inhaler Inhale 2 puffs into the lungs 2 (two) times daily.    . famciclovir (FAMVIR) 250 MG tablet Take 250 mg by mouth 2 (two) times daily.    . fluticasone (FLONASE) 50 MCG/ACT nasal spray Place 2 sprays into both nostrils daily as needed (for stuffy nose). 16 g 5  . fluticasone (FLOVENT HFA) 44 MCG/ACT inhaler Inhale 2 puffs into the lungs 2 (two) times daily. 1 Inhaler 2  . Icosapent Ethyl (VASCEPA PO) Take by mouth.    . Lactobacillus Rhamnosus, GG, (CULTURELLE PO) Take by mouth.    . levalbuterol (XOPENEX HFA) 45 MCG/ACT inhaler Inhale 2 puffs into the lungs every 4 (four) hours as needed for wheezing. 1 Inhaler 2  . levalbuterol (XOPENEX) 1.25 MG/3ML nebulizer solution Take 1.25 mg by nebulization every 6 (six) hours as needed for wheezing or shortness of breath. 75 mL 2  . montelukast (SINGULAIR) 10 MG tablet Take 1 tablet (10 mg total) by mouth at bedtime. 30 tablet 5  . nebivolol (BYSTOLIC) 5 MG tablet Take 5 mg by mouth daily.    Marland Kitchen omeprazole (PRILOSEC) 40 MG capsule Take 40 mg by mouth daily.    Marland Kitchen oxyCODONE-acetaminophen (PERCOCET/ROXICET) 5-325 MG tablet Take by mouth.    . phenazopyridine (PYRIDIUM) 200 MG tablet Take 200 mg by mouth 3 (three) times daily as needed for pain.    . polyvinyl alcohol (LIQUIFILM TEARS) 1.4 % ophthalmic solution Administer 1 drop to both eyes Three (3) times a day as needed for dry eyes.    . potassium chloride SA (K-DUR,KLOR-CON) 20 MEQ tablet Take 20 mEq  by mouth daily.    . promethazine (PHENERGAN) 25 MG tablet     . Spacer/Aero-Holding Chambers (AEROCHAMBER W/FLOWSIGNAL) inhaler Use as directed with inhaler    . triamcinolone cream (KENALOG) 0.1 % Apply topically.    . vitamin B-12 (CYANOCOBALAMIN) 100 MCG tablet Take by mouth.    Marland Kitchen acetaminophen (TYLENOL) 650 MG CR tablet Take by mouth. (Patient not taking: Reported on 06/13/2020)    . ALPRAZolam (XANAX) 1 MG tablet Take by mouth. (Patient not taking: Reported on 06/06/2020)    . beclomethasone (QVAR) 40 MCG/ACT inhaler Inhale into the lungs. (Patient not taking: Reported on 06/06/2020)    . cyclobenzaprine (FLEXERIL) 10 MG tablet Take 10 mg by mouth. (Patient not taking: Reported on 06/06/2020)    . diclofenac sodium (VOLTAREN) 1 % GEL APPLY AS DIRECTED (Patient not taking: Reported on 06/06/2020)    . dicyclomine (BENTYL) 20 MG tablet Take 1 tablet (20 mg total) by mouth 2 (two) times daily. (Patient not taking: Reported on 06/06/2020) 20 tablet 0  . fexofenadine (ALLEGRA) 180 MG tablet Take 1 tablet (180 mg total) by mouth daily. (Patient not taking: Reported on 06/13/2020) 30 tablet 5  . hydroxypropyl methylcellulose (ISOPTO TEARS) 2.5 % ophthalmic solution 1 drop. (  Patient not taking: Reported on 06/06/2020)    . hyoscyamine (LEVSIN, ANASPAZ) 0.125 MG tablet Take by mouth. (Patient not taking: Reported on 06/06/2020)    . Multiple Vitamin (MULTI-VITAMINS) TABS Take by mouth. (Patient not taking: Reported on 06/13/2020)    . naproxen (NAPROSYN) 500 MG tablet Take 1 tablet (500 mg total) by mouth 2 (two) times daily. (Patient not taking: Reported on 06/06/2020) 30 tablet 0  . phentermine (ADIPEX-P) 37.5 MG tablet Take 1/2 tab.at breakfast and 1/2 tab. after lunch (Patient not taking: Reported on 06/06/2020)    . Tiotropium Bromide Monohydrate (SPIRIVA RESPIMAT) 1.25 MCG/ACT AERS Take 2 puffs once daily for cough or wheeze. 1 each 5  . valACYclovir (VALTREX) 1000 MG tablet Take by mouth. (Patient not  taking: Reported on 06/06/2020)     No current facility-administered medications for this visit.    Allergies  Allergen Reactions  . Budesonide-Formoterol Fumarate Palpitations and Other (See Comments)    Shakes Shakes Shaky   . Rocephin [Ceftriaxone Sodium In Dextrose] Shortness Of Breath and Nausea And Vomiting  . Avandia [Rosiglitazone] Other (See Comments)    Heart heart Chest pain  . Flagyl [Metronidazole] Nausea And Vomiting and Other (See Comments)    Severe stomach pain  . Metformin Other (See Comments)    Chest pain  . Tramadol Hcl Other (See Comments)  . Valdecoxib Other (See Comments)    Unknown Unknown  . Vancomycin Nausea And Vomiting  . Amoxicillin Nausea Only  . Atorvastatin Other (See Comments)    Stomach pain   . Avelox [Moxifloxacin Hcl In Nacl]     Patient is unsure what happens   . Baclofen Nausea Only    Unknown, patient knows she couldn't take it  . Cefdinir Other (See Comments)    No reaction listed. Ask pt and enter  . Ceftin [Cefuroxime Axetil]     unknown  . Celebrex [Celecoxib]     Chest pain  . Ciprofloxacin Nausea And Vomiting and Diarrhea  . Clarithromycin     Stomach upset   . Clindamycin Other (See Comments)    Cannot remember  . Clindamycin/Lincomycin     unknown  . Doxycycline     unknown  . Erythromycin Base Other (See Comments)    Ask pt and enter  . Latex Other (See Comments)    Ask pt and enter  . Levaquin [Levofloxacin In D5w]     unknown  . Levofloxacin Other (See Comments)  . Macrobid [employer][Nitrofurantoin Monohyd Macro]     unknown  . Macrodantin [Nitrofurantoin Macrocrystal]   . Metformin And Related     Chest pain  . Mirtazapine Other (See Comments)    Couldn't sleep and legs cramping   . Neurontin [Gabapentin]     Gave patient problems  . Nitrofurantoin     unknown  . Propoxyphene Nausea And Vomiting  . Sulfa Antibiotics Nausea And Vomiting  . Tramadol Hcl   . Ultram [Tramadol] Other (See Comments)     Hallucination unknown  . Vioxx [Rofecoxib] Other (See Comments)    Heart unknown  . Voltaren [Diclofenac Sodium] Diarrhea    Unknown. But she can use the gel  . Ertapenem Dermatitis  . Other Nausea And Vomiting, Rash and Other (See Comments)    Darvocet Irritates skin  . Sulfacetamide Sodium Nausea And Vomiting  . Tape Rash and Other (See Comments)    Can use paper tape Irritates skin   Review of systems: Review of systems negative except as  noted in HPI / PMHx.  Past Medical History:  Diagnosis Date  . Acid reflux   . Arthritis   . COPD (chronic obstructive pulmonary disease) (HCC)   . COPD (chronic obstructive pulmonary disease) (HCC)   . DDD (degenerative disc disease), lumbar   . DDD (degenerative disc disease), lumbar   . Diabetes mellitus without complication (HCC)   . Diverticulitis   . Diverticulosis   . Fibromyalgia   . Functional abdominal pain syndrome   . Mitral valve prolapse   . Pancreatitis 1997  . Pneumonia   . Sarcoidosis   . Sepsis (HCC)   . Sepsis (HCC)     Family History  Problem Relation Age of Onset  . Asthma Mother   . COPD Mother        smoked  . Rheum arthritis Mother   . Skin cancer Mother     Social History   Socioeconomic History  . Marital status: Divorced    Spouse name: Not on file  . Number of children: Not on file  . Years of education: Not on file  . Highest education level: Not on file  Occupational History  . Occupation: Disabled  Tobacco Use  . Smoking status: Never Smoker  . Smokeless tobacco: Never Used  Vaping Use  . Vaping Use: Never used  Substance and Sexual Activity  . Alcohol use: No    Alcohol/week: 0.0 standard drinks  . Drug use: No  . Sexual activity: Not on file  Other Topics Concern  . Not on file  Social History Narrative  . Not on file   Social Determinants of Health   Financial Resource Strain:   . Difficulty of Paying Living Expenses: Not on file  Food Insecurity:   . Worried About  Programme researcher, broadcasting/film/video in the Last Year: Not on file  . Ran Out of Food in the Last Year: Not on file  Transportation Needs:   . Lack of Transportation (Medical): Not on file  . Lack of Transportation (Non-Medical): Not on file  Physical Activity:   . Days of Exercise per Week: Not on file  . Minutes of Exercise per Session: Not on file  Stress:   . Feeling of Stress : Not on file  Social Connections:   . Frequency of Communication with Friends and Family: Not on file  . Frequency of Social Gatherings with Friends and Family: Not on file  . Attends Religious Services: Not on file  . Active Member of Clubs or Organizations: Not on file  . Attends Banker Meetings: Not on file  . Marital Status: Not on file  Intimate Partner Violence:   . Fear of Current or Ex-Partner: Not on file  . Emotionally Abused: Not on file  . Physically Abused: Not on file  . Sexually Abused: Not on file    I appreciate the opportunity to take part in Holly Rogers's care. Please do not hesitate to contact me with questions.  Sincerely,   R. Jorene Guest, MD

## 2020-06-13 NOTE — Assessment & Plan Note (Signed)
   Continue appropriate allergen avoidance measures.  A prescription has been provided for azelastine/fluticasone nasal spray, 1 spray per nostril twice daily as needed. Proper nasal spray technique has been discussed and demonstrated.  Nasal saline lavage (NeilMed) has been recommended as needed and prior to medicated nasal sprays along with instructions for proper administration.  For thick post nasal drainage, add guaifenesin [phone] mg (Mucinex)  twice daily as needed with adequate hydration as discussed.

## 2020-06-13 NOTE — Patient Instructions (Addendum)
Moderate persistent asthma with acute exacerbation Based upon the history, minimizing exposure to mold may be beneficial. The patient is unable to tolerate ICS/LABAs due to tachycardia and tremors.    Prednisone has been provided, 20 mg x 4 days, 10 mg x1 day, then stop.  For now, continue Alvesco 80 g, 2 inhalations twice daily.  To maximize pulmonary deposition, a spacer has been provided along with instructions for its proper administration with an HFA inhaler.  A prescription has been provided for Spiriva Respimat 1.25 g, 2 inhalations daily.  Continue albuterol HFA, 1 to 2 inhalations every 4-6 hours if needed.  Subjective and objective measures of pulmonary function will be followed and the treatment plan will be adjusted accordingly.  Other allergic rhinitis  Continue appropriate allergen avoidance measures.  A prescription has been provided for azelastine/fluticasone nasal spray, 1 spray per nostril twice daily as needed. Proper nasal spray technique has been discussed and demonstrated.  Nasal saline lavage (NeilMed) has been recommended as needed and prior to medicated nasal sprays along with instructions for proper administration.  For thick post nasal drainage, add guaifenesin [phone] mg (Mucinex)  twice daily as needed with adequate hydration as discussed.  Gastroesophageal reflux disease without esophagitis  Continue appropriate reflux lifestyle modifications.  Continue omeprazole 40 mg daily as previously prescribed.  Follow-up with your gastroenterologist as recommended.   Return in about 4 months (around 10/13/2020), or if symptoms worsen or fail to improve.

## 2020-06-14 ENCOUNTER — Telehealth: Payer: Self-pay

## 2020-06-14 NOTE — Telephone Encounter (Signed)
Pt needs note for landlord about her mold allergy please advise to what you would like the letter to state

## 2020-06-14 NOTE — Telephone Encounter (Signed)
To whom it may concern:  Holly Rogers is a patient of mine at the [facility].  She has persistent asthma and allergic rhinoconjunctivitis.  Her upper and lower respiratory symptoms are exacerbated by mold exposure.  Please remediate the mold/mildew/water damage or allow her to move into a different unit or break her lease.  Thank you and please not hesitate to contact my office with questions or concerns.  Sincerely,   R.  Jorene Guest, MD

## 2020-06-14 NOTE — Telephone Encounter (Signed)
Pa submitted thru cover my meds for spiriva

## 2020-06-14 NOTE — Telephone Encounter (Signed)
Pa approved for spiriva

## 2020-06-15 NOTE — Telephone Encounter (Signed)
Tried calling Holly Rogers- says the number is not in service. I was going to inform the Holly Rogers that we would have Dr. Nunzio Cobbs sign the letter on Monday.

## 2020-06-18 ENCOUNTER — Encounter: Payer: Self-pay | Admitting: *Deleted

## 2020-06-18 NOTE — Telephone Encounter (Signed)
Tried calling [phone] and this number is not in service. If the pt calls [place] then we need to get an updated working phone number.   Dr. Nunzio Cobbs signed the letter for her and I will mail out to patient.

## 2020-06-22 LAB — ALLERGENS, ZONE 2

## 2020-06-22 NOTE — Progress Notes (Signed)
Can you please let this patient know her lab results were negative to the entire environmental allergy zone 2 panel. Thank you

## 2020-06-28 ENCOUNTER — Telehealth: Payer: Self-pay

## 2020-06-28 NOTE — Telephone Encounter (Signed)
Will mail letter to patient asking her to call the office.

## 2020-06-28 NOTE — Telephone Encounter (Signed)
Called pt. No routes found was message I received. Unable to leave message

## 2020-06-28 NOTE — Progress Notes (Signed)
Called pt. No routes found was the message I received. No answer. Unable to leave message

## 2020-06-28 NOTE — Telephone Encounter (Signed)
-----   Message from Hetty Blend, FNP sent at 06/22/2020  8:33 AM EDT ----- Can you please let this patient know her lab results were negative to the entire environmental allergy zone 2 panel. Thank you

## 2020-07-06 ENCOUNTER — Telehealth: Payer: Self-pay | Admitting: Allergy and Immunology

## 2020-07-06 ENCOUNTER — Ambulatory Visit: Payer: Medicare Other | Admitting: Family Medicine

## 2020-07-06 NOTE — Telephone Encounter (Signed)
Please advise. Thank you

## 2020-07-06 NOTE — Telephone Encounter (Signed)
Asthma: Please have her stop Spiriva at this time and begin Alvesco 160 -2 puffs twice a day. Please order Mikael Spray once a day via nebulizer to prevent cough or wheeze. Allergic rhinitis: Please have her begin nasal saline gel as needed for dry nostrils and stop flonase at this time. Continue saline nasal rinses sevral times a day. Epistaxis: Pinch both nostrils while leaning forward for at least 5 minutes before checking to see if the bleeding has stopped. If bleeding is not controlled within 5-10 minutes apply a cotton ball soaked with oxymetazoline (Afrin) to the bleeding nostril for a few seconds.  If the problem persists or worsens a referral to ENT for further evaluation may be necessary. Follow up: please have her follow up on Monday. If her symptoms worsen or she develops a fever, please have her call the clinic or go to the ED. Thank you

## 2020-07-06 NOTE — Telephone Encounter (Signed)
PT called says the spiriva is making her mouth hurt and lips swollen. Would like a different medication. Also states her sinuses have been bleeding and would like a antibiotic. Still having trouble breathing as well.

## 2020-07-10 NOTE — Telephone Encounter (Signed)
Pt states that she has an uri and is on a z pack at the moment. Pt is aware of this information and knows to follow up.

## 2020-10-16 ENCOUNTER — Ambulatory Visit: Payer: Medicare Other | Admitting: Allergy and Immunology

## 2021-01-16 ENCOUNTER — Telehealth: Payer: Self-pay | Admitting: Family Medicine

## 2021-01-16 NOTE — Telephone Encounter (Signed)
Left a message for patient to call the office in regards to this matter. Patient has not been seen since 06/13/2020. I looked under her labs and didn't see any from our office. I don't see any documentation stated that someone called her. Will need to see what patient is referring to.

## 2021-01-16 NOTE — Telephone Encounter (Signed)
Patient was returning a nurse call about blood work, but call her after 3:00. [phone].

## 2021-01-21 NOTE — Telephone Encounter (Signed)
Left a message for patient to call the office in regards to this matter. 

## 2021-04-19 ENCOUNTER — Ambulatory Visit: Payer: Medicare Other | Admitting: Family

## 2021-05-08 ENCOUNTER — Other Ambulatory Visit: Payer: Self-pay

## 2021-05-08 ENCOUNTER — Emergency Department (HOSPITAL_BASED_OUTPATIENT_CLINIC_OR_DEPARTMENT_OTHER)
Admission: EM | Admit: 2021-05-08 | Discharge: 2021-05-08 | Disposition: A | Discharge status: Home / Self Care | Payer: Medicare Other | Attending: Emergency Medicine | Admitting: Emergency Medicine

## 2021-05-08 ENCOUNTER — Emergency Department (HOSPITAL_BASED_OUTPATIENT_CLINIC_OR_DEPARTMENT_OTHER): Payer: Medicare Other

## 2021-05-08 ENCOUNTER — Encounter (HOSPITAL_BASED_OUTPATIENT_CLINIC_OR_DEPARTMENT_OTHER): Payer: Self-pay | Admitting: Emergency Medicine

## 2021-05-08 DIAGNOSIS — R0789 Other chest pain: Secondary | ICD-10-CM | POA: Insufficient documentation

## 2021-05-08 DIAGNOSIS — R0602 Shortness of breath: Secondary | ICD-10-CM | POA: Diagnosis not present

## 2021-05-08 DIAGNOSIS — Z91040 Latex allergy status: Secondary | ICD-10-CM | POA: Insufficient documentation

## 2021-05-08 DIAGNOSIS — K449 Diaphragmatic hernia without obstruction or gangrene: Secondary | ICD-10-CM | POA: Insufficient documentation

## 2021-05-08 DIAGNOSIS — J4541 Moderate persistent asthma with (acute) exacerbation: Secondary | ICD-10-CM | POA: Insufficient documentation

## 2021-05-08 DIAGNOSIS — R600 Localized edema: Secondary | ICD-10-CM | POA: Insufficient documentation

## 2021-05-08 DIAGNOSIS — E11649 Type 2 diabetes mellitus with hypoglycemia without coma: Secondary | ICD-10-CM | POA: Insufficient documentation

## 2021-05-08 DIAGNOSIS — J449 Chronic obstructive pulmonary disease, unspecified: Secondary | ICD-10-CM | POA: Insufficient documentation

## 2021-05-08 DIAGNOSIS — Z7951 Long term (current) use of inhaled steroids: Secondary | ICD-10-CM | POA: Diagnosis not present

## 2021-05-08 DIAGNOSIS — Z85828 Personal history of other malignant neoplasm of skin: Secondary | ICD-10-CM | POA: Diagnosis not present

## 2021-05-08 LAB — COMPREHENSIVE METABOLIC PANEL
ALT: 52 U/L — ABNORMAL HIGH (ref 0–44)
AST: 47 U/L — ABNORMAL HIGH (ref 15–41)
Albumin: 4.3 g/dL (ref 3.5–5.0)
Alkaline Phosphatase: 141 U/L — ABNORMAL HIGH (ref 38–126)
Anion gap: 8 (ref 5–15)
BUN: 9 mg/dL (ref 6–20)
CO2: 25 mmol/L (ref 22–32)
Calcium: 9.3 mg/dL (ref 8.9–10.3)
Chloride: 107 mmol/L (ref 98–111)
Creatinine, Ser: 0.71 mg/dL (ref 0.44–1.00)
GFR, Estimated: >60 mL/min (ref >60)
Glucose, Bld: 114 mg/dL — ABNORMAL HIGH (ref 70–99)
Potassium: 3.7 mmol/L (ref 3.5–5.1)
Sodium: 140 mmol/L (ref 135–145)
Total Bilirubin: 0.5 mg/dL (ref 0.3–1.2)
Total Protein: 7.4 g/dL (ref 6.5–8.1)

## 2021-05-08 LAB — CBC WITH DIFFERENTIAL/PLATELET
Abs Immature Granulocytes: 0.01 10*3/uL (ref 0.00–0.07)
Basophils Absolute: 0 10*3/uL (ref 0.0–0.1)
Basophils Relative: 0 %
Eosinophils Absolute: 0.1 10*3/uL (ref 0.0–0.5)
Eosinophils Relative: 1 %
HCT: 37.4 % (ref 36.0–46.0)
Hemoglobin: 12.9 g/dL (ref 12.0–15.0)
Immature Granulocytes: 0 %
Lymphocytes Relative: 46 %
Lymphs Abs: 2.3 10*3/uL (ref 0.7–4.0)
MCH: 32.1 pg (ref 26.0–34.0)
MCHC: 34.5 g/dL (ref 30.0–36.0)
MCV: 93 fL (ref 80.0–100.0)
Monocytes Absolute: 0.6 10*3/uL (ref 0.1–1.0)
Monocytes Relative: 11 %
Neutro Abs: 2.1 10*3/uL (ref 1.7–7.7)
Neutrophils Relative %: 42 %
Platelets: 227 10*3/uL (ref 150–400)
RBC: 4.02 MIL/uL (ref 3.87–5.11)
RDW: 11.6 % (ref 11.5–15.5)
WBC: 5 10*3/uL (ref 4.0–10.5)
nRBC: 0 % (ref 0.0–0.2)

## 2021-05-08 LAB — TROPONIN I (HIGH SENSITIVITY): Troponin I (High Sensitivity): 3 ng/L (ref <18)

## 2021-05-08 LAB — D-DIMER, QUANTITATIVE: D-Dimer, Quant: 0.29 ug/mL-FEU (ref 0.00–0.50)

## 2021-05-08 MED ORDER — OXYCODONE HCL 5 MG PO TABS
5.0000 mg | ORAL_TABLET | Freq: Once | ORAL | Status: AC
Start: 2021-05-08 — End: 2021-05-08
  Administered 2021-05-08: 5 mg via ORAL
  Filled 2021-05-08: qty 1

## 2021-05-08 NOTE — ED Triage Notes (Signed)
Pt states she is having chest pain  Pt states it started 2 weeks ago  Pt is c/o pain in her left arm  Pain is on the left side and under the left breast  Pt has hx of sarcoidosis   Pt states the pain is like a burning pain  Pt states the pain has shifted under her right breast  Pt states she has been feeling weak

## 2021-05-08 NOTE — ED Provider Notes (Signed)
[facility] EMERGENCY DEPARTMENT Provider Note   CSN: [id number] Arrival date & time: 05/08/21  6967     History Chief Complaint  Patient presents with   Chest Pain    Holly Rogers is a 60 y.o. female.  The history is provided by the patient and medical records.  Chest Pain Holly Rogers is a 60 y.o. female who presents to the Emergency Department complaining of chest pain. She presents to the emergency department complaining of left-sided chest pain that started about 2 to 3 weeks ago. Pain initially started in the left axillary region and stated that she felt like her lymph nodes were enlarged, which is typical for her Sarcoid.   The lymph nodes got smaller but then she developed pain to the left lower breast and lateral chest. This pain has now migrated to the left medial chest. Pain is constant in nature. Pain is worse when she moves her arm. She also reports that she has experienced increased bilateral lower extremity edema over the last few weeks. This is present when she stands for a long period of time. She also reports mild increase shortness of breath. No hemoptysis. No fevers, nausea, vomiting. She does also have a history of COPD. She does not have a history of blood clots. She has been taking Percocet at home with no significant improvement in symptoms. No reports of injuries.    Past Medical History:  Diagnosis Date   Acid reflux    Arthritis    COPD (chronic obstructive pulmonary disease) (HCC)    COPD (chronic obstructive pulmonary disease) (HCC)    DDD (degenerative disc disease), lumbar    DDD (degenerative disc disease), lumbar    Diabetes mellitus without complication (HCC)    Diverticulitis    Diverticulosis    Fibromyalgia    Functional abdominal pain syndrome    Mitral valve prolapse    Pancreatitis 1997   Pneumonia    Sarcoidosis    Sepsis (HCC)    Sepsis [facility])     Patient Active Problem List   Diagnosis Date Noted   Moderate persistent  asthma with acute exacerbation 06/06/2020   Allergy 06/22/2018   Hyperalgesia 06/22/2018   MVP (mitral valve prolapse) 06/22/2018   Temporomandibular joint disorder 06/22/2018   Sarcoid 06/22/2018   Bacterial vaginitis 06/04/2018   New daily persistent headache 06/04/2018   Vulvar fissure 06/04/2018   Acute vaginitis 04/26/2018   Mild persistent asthma, uncomplicated 08/03/2017   Possible exposure to STD 11/11/2016   Chronic bronchitis (HCC) 04/10/2016   Chronic fatigue syndrome 04/10/2016   DDD (degenerative disc disease), lumbar 04/10/2016   Clostridium difficile infection 03/08/2016   Sigmoid diverticulosis 03/06/2016   Urinary incontinence 10/30/2015   Sinusitis 10/20/2015   Anxiety 10/20/2015   History of Clostridium difficile colitis 10/20/2015   Hypoglycemia 10/20/2015   Type 2 diabetes mellitus without complication, without long-term current use of insulin (HCC) 10/20/2015   Diarrhea 10/19/2015   Diverticulosis 10/18/2015   Diverticulitis of large intestine 10/18/2015   Hypokalemia 10/18/2015   Rectal bleed 10/18/2015   Chronic neck pain 09/27/2015   Other allergic rhinitis 06/15/2015   IBS (irritable bowel syndrome) 05/15/2015   History of nonmelanoma skin cancer 04/03/2015   Chest pain 03/07/2015   Gastroesophageal reflux disease without esophagitis 03/07/2015   Asthma 03/07/2015   Fibromyalgia 03/07/2015   Sepsis (HCC) 03/05/2014   CAP (community acquired pneumonia) 03/05/2014   Incidental pulmonary nodule 12/11/2011   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     Family History  Problem Relation Age of Onset   Asthma Mother    COPD Mother        smoked   Rheum arthritis Mother    Skin cancer Mother      Social History   Tobacco Use   Smoking status: Never   Smokeless tobacco: Never  Vaping Use   Vaping Use: Never used  Substance Use Topics   Alcohol use: No    Alcohol/week: 0.0 standard drinks   Drug use: No    Home Medications Prior to Admission medications   Medication Sig Start Date End Date Taking? Authorizing Provider  acetaminophen (TYLENOL) 650 MG CR tablet Take by mouth. Patient not taking: Reported on 06/13/2020    [provider]  ALPRAZolam Prudy Feeler) 1 MG tablet Take by mouth. Patient not taking: Reported on 06/06/2020    [provider]  atorvastatin (LIPITOR) 20 MG tablet Take 20 mg by mouth daily.    [provider]  azelastine (ASTELIN) 0.1 % nasal spray Take 2 sprays per nostril twice daily as needed for runny nose 06/22/18   Fletcher Anon, MD  beclomethasone (QVAR) 40 MCG/ACT inhaler Inhale into the lungs. Patient not taking: Reported on 06/06/2020 07/24/16   [provider]  benzonatate (TESSALON) 100 MG capsule Take 100 mg by mouth 3 (three) times daily as needed for cough.    [provider]  Cholecalciferol (VITAMIN D-3 PO) Take by mouth.    [provider]  ciclesonide (ALVESCO) 80 MCG/ACT inhaler Inhale 2 puffs into the lungs 2 (two) times daily.    [provider]  cyclobenzaprine (FLEXERIL) 10 MG tablet Take 10 mg by mouth. Patient not taking: Reported on 06/06/2020    [provider]  diclofenac sodium (VOLTAREN) 1 % GEL APPLY AS DIRECTED Patient not taking: Reported on 06/06/2020 09/13/14   [provider]  dicyclomine (BENTYL) 20 MG tablet Take 1 tablet (20 mg total) by mouth 2 (two) times daily. Patient not taking: Reported on 06/06/2020 02/22/19   Palumbo, April, MD  famciclovir (FAMVIR) 250 MG tablet Take 250 mg by mouth 2 (two) times daily. 05/25/20   [provider]  fexofenadine (ALLEGRA) 180 MG tablet Take 1 tablet (180 mg total) by mouth daily. Patient not  taking: Reported on 06/13/2020 06/22/18   Fletcher Anon, MD  fluticasone [facility]) 50 MCG/ACT nasal spray Place 2 sprays into both nostrils daily as needed (for stuffy nose). 06/22/18   Fletcher Anon, MD  fluticasone (FLOVENT HFA) 44 MCG/ACT inhaler Inhale 2 puffs into the lungs 2 (two) times daily. 05/11/18   Fletcher Anon, MD  hydroxypropyl methylcellulose (ISOPTO TEARS) 2.5 % ophthalmic solution 1 drop. Patient not taking: Reported on 06/06/2020 12/14/14   [provider]  hyoscyamine (LEVSIN, ANASPAZ) 0.125 MG tablet Take by mouth. Patient not taking: Reported on 06/06/2020    [provider]  Icosapent Ethyl (VASCEPA PO) Take by mouth.    [provider]  Lactobacillus Rhamnosus, GG, (CULTURELLE PO) Take by mouth.    [provider]  levalbuterol Pauline Aus HFA) 45 MCG/ACT inhaler Inhale 2 puffs into the lungs every 4 (four) hours as needed for wheezing. 06/22/18  Fletcher Anon, MD  levalbuterol (XOPENEX) 1.25 MG/3ML nebulizer solution Take 1.25 mg by nebulization every 6 (six) hours as needed for wheezing or shortness of breath. 06/22/18   Fletcher Anon, MD  montelukast (SINGULAIR) 10 MG tablet Take 1 tablet (10 mg total) by mouth at bedtime. 06/06/20   Hetty Blend, FNP  Multiple Vitamin (MULTI-VITAMINS) TABS Take by mouth. Patient not taking: Reported on 06/13/2020    [provider]  naproxen (NAPROSYN) 500 MG tablet Take 1 tablet (500 mg total) by mouth 2 (two) times daily. Patient not taking: Reported on 06/06/2020 03/28/19   Horton, Mayer Masker, MD  nebivolol (BYSTOLIC) 5 MG tablet Take 5 mg by mouth daily.    [provider]  omeprazole (PRILOSEC) 40 MG capsule Take 40 mg by mouth daily.    [provider]  oxyCODONE-acetaminophen (PERCOCET/ROXICET) 5-325 MG tablet Take by mouth.    [provider]  phenazopyridine (PYRIDIUM) 200 MG tablet Take 200 mg by mouth 3 (three) times daily as needed for pain.    [provider]  phentermine (ADIPEX-P) 37.5 MG tablet Take 1/2 tab.at breakfast and 1/2 tab. after lunch Patient not taking: Reported on 06/06/2020 06/01/18   [provider]  polyvinyl alcohol (LIQUIFILM TEARS) 1.4 % ophthalmic solution Administer 1 drop to both eyes Three (3) times a day as needed for dry eyes.    [provider]  potassium chloride SA (K-DUR,KLOR-CON) 20 MEQ tablet Take 20 mEq by mouth daily. 01/17/14   [provider]  promethazine (PHENERGAN) 25 MG tablet  04/14/18   [provider]  Spacer/Aero-Holding Chambers (AEROCHAMBER W/FLOWSIGNAL) inhaler Use as directed with inhaler 05/11/18   [provider]  Tiotropium Bromide Monohydrate (SPIRIVA RESPIMAT) 1.25 MCG/ACT AERS Take 2 puffs once daily for cough or wheeze. 06/13/20   Bobbitt, Heywood Iles, MD  triamcinolone cream (KENALOG) 0.1 % Apply topically. 07/20/15   [provider]  valACYclovir (VALTREX) 1000 MG tablet Take by mouth. Patient not taking: Reported on 06/06/2020 06/06/18   [provider]  vitamin B-12 (CYANOCOBALAMIN) 100 MCG tablet Take by mouth. 12/01/08   [provider]    Allergies    Budesonide-formoterol fumarate, Rocephin [ceftriaxone sodium in dextrose], Avandia [rosiglitazone], Flagyl [metronidazole], Metformin, Tramadol hcl, Valdecoxib, Vancomycin, Amoxicillin, Atorvastatin, Avelox [moxifloxacin hcl in nacl], Baclofen, Cefdinir, Ceftin [cefuroxime axetil], Celebrex [celecoxib], Ciprofloxacin, Clarithromycin, Clindamycin, Clindamycin/lincomycin, Doxycycline, Erythromycin base, Latex, Levaquin [levofloxacin in d5w], Levofloxacin, Macrobid [nitrofurantoin monohyd macro], Macrodantin [nitrofurantoin macrocrystal], Metformin and related, Mirtazapine, Neurontin [gabapentin], Nitrofurantoin, Propoxyphene, Sulfa antibiotics, Tramadol hcl, Ultram [tramadol], Vioxx [rofecoxib], Voltaren [diclofenac sodium], Ertapenem, Other, Sulfacetamide sodium, and  Tape  Review of Systems   Review of Systems  Cardiovascular:  Positive for chest pain.  All other systems reviewed and are negative.  Physical Exam Updated Vital Signs BP 111/81 (BP Location: Left Arm)   Pulse 83   Temp 98.2 F (36.8 C) (Oral)   Resp 19   Ht 5\' 3"  (1.6 m)   Wt 89.4 kg   SpO2 96%   BMI 34.90 kg/m   Physical Exam Vitals and nursing note reviewed.  Constitutional:      Appearance: She is well-developed.  HENT:     Head: Normocephalic and atraumatic.  Cardiovascular:     Rate and Rhythm: Normal rate and regular rhythm.     Heart sounds: No murmur heard. Pulmonary:     Effort: Pulmonary effort is normal. No respiratory distress.     Breath sounds: Normal breath  sounds.  Abdominal:     Palpations: Abdomen is soft.     Tenderness: There is no abdominal tenderness. There is no guarding or rebound.  Musculoskeletal:        General: No tenderness.     Comments: No axillary tenderness to palpation. No axillary masses. There is some pain referred to the axillary and left upper chest region with movement of the left shoulder. No breast masses. Trace edema to bilateral lower extremities  Skin:    General: Skin is warm and dry.  Neurological:     Mental Status: She is alert and oriented to person, place, and time.  Psychiatric:        Behavior: Behavior normal.    ED Results / Procedures / Treatments   Labs (all labs ordered are listed, but only abnormal results are displayed) Labs Reviewed  COMPREHENSIVE METABOLIC PANEL - Abnormal; Notable for the following components:      Result Value   Glucose, Bld 114 (*)    AST 47 (*)    ALT 52 (*)    Alkaline Phosphatase 141 (*)    All other components within normal limits  CBC WITH DIFFERENTIAL/PLATELET  D-DIMER, QUANTITATIVE  TROPONIN I (HIGH SENSITIVITY)  TROPONIN I (HIGH SENSITIVITY)    EKG EKG Interpretation  Date/Time:  Wednesday May 08 2021 04:35:29 EDT Ventricular Rate:  96 PR Interval:  185 QRS  Duration: 87 QT Interval:  342 QTC Calculation: 433 R Axis:   37 Text Interpretation: Sinus rhythm Abnormal R-wave progression, early transition Confirmed by Tilden Fossaees, Josejuan Hoaglin [phone]([id number]) on 05/08/2021 4:38:12 AM  Radiology DG Chest 2 View  Result Date: 05/08/2021 CLINICAL DATA:  60 year old female with chest pain. Burning symptoms. Left arm pain and weakness. History of sarcoidosis. EXAM: CHEST - 2 VIEW COMPARISON:  Portable chest 09/11/2020 and earlier. 10/04/2020 CT Abdomen and Pelvis. FINDINGS: Moderate size gastric hiatal hernia redemonstrated with air-fluid level in the stomach today. Other mediastinal contours are within normal limits. Lung volumes are stable and within normal limits. Visualized tracheal air column is within normal limits. No pneumothorax, pulmonary edema, pleural effusion or confluent pulmonary opacity. Stable mild increased pulmonary interstitial markings. Osteopenia. No acute osseous abnormality identified. Negative visible bowel gas pattern. IMPRESSION: 1. No acute cardiopulmonary abnormality. 2. Moderate size chronic gastric hiatal hernia. Electronically Signed   By: Odessa FlemingH  Hall M.D.   On: 05/08/2021 05:49    Procedures Procedures   Medications Ordered in ED Medications  oxyCODONE (Oxy IR/ROXICODONE) immediate release tablet 5 mg (5 mg Oral Given 05/08/21 0456)    ED Course  I have reviewed the triage vital signs and the nursing notes.  Pertinent labs & imaging results that were available during my care of the patient were reviewed by me and considered in my medical decision making (see chart for details).    MDM Rules/Calculators/A&P                          patient here for evaluation of two weeks of left-sided chest pain that is constant in nature. EKG is without acute ischemic changes in troponin is negative. D dimer is negative. Doubt PE, ACS, dissection. No evidence of pneumonia, CHF. She does have mild elevation in transaminases. She is status post cholecystectomy,  has no abdominal pain. She does not take acetaminophen. These elevations are mild. She is scheduled for follow-up with her PCP later this week. Do not feel this is contrary to her symptoms. Chest x-ray does  demonstrate a hiatal hernia, which is chronic. She has no G.I. symptoms at this time. Discussed the findings of studies and importance of PCP follow-up. Patient is requesting a shot of dexamethasone. Feel at this point in time there is a greater chance of harm from this medication and do not feel that it will acutely benefit her. Discussed using a Voltaren gel at home with warm compresses and rest for her pain.  Final Clinical Impression(s) / ED Diagnoses Final diagnoses:  Atypical chest pain  Hiatal hernia    Rx / DC Orders ED Discharge Orders     None        Tilden Fossa, MD 05/08/21 [phone]

## 2021-05-08 NOTE — Discharge Instructions (Addendum)
Your liver enzymes with mildly elevated today.  Please follow up with your family doctor for further evaluation and recheck in the next week.

## 2021-09-26 ENCOUNTER — Emergency Department (HOSPITAL_BASED_OUTPATIENT_CLINIC_OR_DEPARTMENT_OTHER)
Admission: EM | Admit: 2021-09-26 | Discharge: 2021-09-26 | Disposition: A | Discharge status: Home / Self Care | Payer: Medicare HMO | Attending: Emergency Medicine | Admitting: Emergency Medicine

## 2021-09-26 ENCOUNTER — Emergency Department (HOSPITAL_BASED_OUTPATIENT_CLINIC_OR_DEPARTMENT_OTHER): Payer: Medicare HMO

## 2021-09-26 ENCOUNTER — Other Ambulatory Visit: Payer: Self-pay

## 2021-09-26 ENCOUNTER — Encounter (HOSPITAL_BASED_OUTPATIENT_CLINIC_OR_DEPARTMENT_OTHER): Payer: Self-pay | Admitting: Emergency Medicine

## 2021-09-26 DIAGNOSIS — R1011 Right upper quadrant pain: Secondary | ICD-10-CM | POA: Insufficient documentation

## 2021-09-26 DIAGNOSIS — Z91040 Latex allergy status: Secondary | ICD-10-CM | POA: Insufficient documentation

## 2021-09-26 DIAGNOSIS — J449 Chronic obstructive pulmonary disease, unspecified: Secondary | ICD-10-CM | POA: Insufficient documentation

## 2021-09-26 DIAGNOSIS — E119 Type 2 diabetes mellitus without complications: Secondary | ICD-10-CM | POA: Diagnosis not present

## 2021-09-26 LAB — COMPREHENSIVE METABOLIC PANEL
ALT: 29 U/L (ref 0–44)
AST: 29 U/L (ref 15–41)
Albumin: 4 g/dL (ref 3.5–5.0)
Alkaline Phosphatase: 123 U/L (ref 38–126)
Anion gap: 8 (ref 5–15)
BUN: 9 mg/dL (ref 6–20)
CO2: 25 mmol/L (ref 22–32)
Calcium: 9.1 mg/dL (ref 8.9–10.3)
Chloride: 107 mmol/L (ref 98–111)
Creatinine, Ser: 0.63 mg/dL (ref 0.44–1.00)
GFR, Estimated: >60 mL/min (ref >60)
Glucose, Bld: 87 mg/dL (ref 70–99)
Potassium: 3.7 mmol/L (ref 3.5–5.1)
Sodium: 140 mmol/L (ref 135–145)
Total Bilirubin: 0.6 mg/dL (ref 0.3–1.2)
Total Protein: 7 g/dL (ref 6.5–8.1)

## 2021-09-26 LAB — CBC WITH DIFFERENTIAL/PLATELET
Abs Immature Granulocytes: 0.01 10*3/uL (ref 0.00–0.07)
Basophils Absolute: 0 10*3/uL (ref 0.0–0.1)
Basophils Relative: 0 %
Eosinophils Absolute: 0.1 10*3/uL (ref 0.0–0.5)
Eosinophils Relative: 2 %
HCT: 34.7 % — ABNORMAL LOW (ref 36.0–46.0)
Hemoglobin: 11.6 g/dL — ABNORMAL LOW (ref 12.0–15.0)
Immature Granulocytes: 0 %
Lymphocytes Relative: 53 %
Lymphs Abs: 2.4 10*3/uL (ref 0.7–4.0)
MCH: 30.8 pg (ref 26.0–34.0)
MCHC: 33.4 g/dL (ref 30.0–36.0)
MCV: 92 fL (ref 80.0–100.0)
Monocytes Absolute: 0.7 10*3/uL (ref 0.1–1.0)
Monocytes Relative: 15 %
Neutro Abs: 1.4 10*3/uL — ABNORMAL LOW (ref 1.7–7.7)
Neutrophils Relative %: 30 %
Platelets: 192 10*3/uL (ref 150–400)
RBC: 3.77 MIL/uL — ABNORMAL LOW (ref 3.87–5.11)
RDW: 12.3 % (ref 11.5–15.5)
WBC: 4.6 10*3/uL (ref 4.0–10.5)
nRBC: 0 % (ref 0.0–0.2)

## 2021-09-26 LAB — LIPASE, BLOOD: Lipase: 28 U/L (ref 11–51)

## 2021-09-26 MED ORDER — IOHEXOL 300 MG/ML  SOLN
100.0000 mL | Freq: Once | INTRAMUSCULAR | Status: AC | PRN
  Administered 2021-09-26: 100 mL via INTRAVENOUS

## 2021-09-26 MED ORDER — HYDROMORPHONE HCL 1 MG/ML IJ SOLN
1.0000 mg | Freq: Once | INTRAMUSCULAR | Status: AC
  Administered 2021-09-26: 1 mg via INTRAVENOUS
  Filled 2021-09-26: qty 1

## 2021-09-26 MED ORDER — SUCRALFATE 1 G PO TABS: 1.0000 g | ORAL_TABLET | Freq: Three times a day (TID) | ORAL | 0 refills | Status: AC

## 2021-09-26 MED ORDER — PANTOPRAZOLE SODIUM 20 MG PO TBEC: 20.0000 mg | DELAYED_RELEASE_TABLET | Freq: Every day | ORAL | 0 refills | Status: AC

## 2021-09-26 MED ORDER — ONDANSETRON HCL 4 MG/2ML IJ SOLN
4.0000 mg | Freq: Once | INTRAMUSCULAR | Status: AC
  Administered 2021-09-26: 4 mg via INTRAVENOUS
  Filled 2021-09-26: qty 2

## 2021-09-26 MED ORDER — PROMETHAZINE HCL 25 MG PO TABS: 25.0000 mg | ORAL_TABLET | Freq: Three times a day (TID) | ORAL | 0 refills | Status: AC | PRN

## 2021-09-26 NOTE — ED Provider Notes (Signed)
[facility] Provider Note: Lowella DellJ. Lane Halla Chopp, MD, FACEP  CSN: [id number] MRN: [id number] ARRIVAL: 09/26/21 at 0547 ROOM: MH02/[facility]02   CHIEF COMPLAINT  Abdominal Pain   HISTORY OF PRESENT ILLNESS  09/26/21 6:03 AM Zella BallRobin Larina BrasStone is a 61 y.o. female with 4 days of abdominal pain.  She states the abdominal pain is present on the right side of her abdomen.  It is worse after she eats when it is strongest in the right upper quadrant and radiates down into her right motor quadrant and feels like her appendix is going to rupture.  She is also getting pain in her suprapubic region and even into her left lower quadrant when the pain is severe.  She rates her pain as an 8 out of 10 at its worst.  She has associated nausea but no vomiting.  She has constipation but attributes this to being on chronic narcotics oxycodone/APAP 7.5/325 #90/month.  She has not gotten relief with her home narcotics.  She states her abdomen feels distended as well and has a "cold" area in her right paraumbilical area.   Past Medical History:  Diagnosis Date   Acid reflux    Arthritis    COPD (chronic obstructive pulmonary disease) (HCC)    DDD (degenerative disc disease), lumbar    Diabetes mellitus without complication (HCC)    Diverticulitis    Diverticulosis    Fibromyalgia    Functional abdominal pain syndrome    Mitral valve prolapse    Pancreatitis 1997   Pneumonia    Sarcoidosis    Sepsis (HCC)     Past Surgical History:  Procedure Laterality Date   ABDOMINAL HYSTERECTOMY     CHOLECYSTECTOMY      Family History  Problem Relation Age of Onset   Asthma Mother    COPD Mother        smoked   Rheum arthritis Mother    Skin cancer Mother     Social History   Tobacco Use   Smoking status: Never   Smokeless tobacco: Never  Vaping Use   Vaping Use: Never used  Substance Use Topics   Alcohol use: No    Alcohol/week: 0.0 standard drinks   Drug use: No    Prior to Admission medications    Medication Sig Start Date End Date Taking? Authorizing Provider  naloxone [facility](NARCAN) nasal spray 4 mg/0.1 mL Place into the nose. 05/31/19  Yes [provider]  pantoprazole (PROTONIX) 20 MG tablet Take 1 tablet (20 mg total) by mouth daily for 14 days. 09/26/21 10/10/21 Yes Curatolo, Adam, DO  sucralfate (CARAFATE) 1 g tablet Take 1 tablet (1 g total) by mouth 4 (four) times daily -  with meals and at bedtime for 14 days. 09/26/21 10/10/21 Yes Curatolo, Adam, DO  atorvastatin (LIPITOR) 20 MG tablet Take 20 mg by mouth daily.    [provider]  azelastine (ASTELIN) 0.1 % nasal spray Take 2 sprays per nostril twice daily as needed for runny nose 06/22/18   Fletcher AnonBardelas, Jose A, MD  benzonatate (TESSALON) 100 MG capsule Take 100 mg by mouth 3 (three) times daily as needed for cough.    [provider]  Cholecalciferol (VITAMIN D-3 PO) Take by mouth.    [provider]  ciclesonide (ALVESCO) 80 MCG/ACT inhaler Inhale 2 puffs into the lungs 2 (two) times daily.    [provider]  diazepam (VALIUM) 10 MG tablet Take 10 mg by mouth 2 (two) times daily as needed. 09/25/21  [provider]  famciclovir (FAMVIR) 250 MG tablet Take 250 mg by mouth 2 (two) times daily. 05/25/20   [provider]  fluconazole (DIFLUCAN) 200 MG tablet Take 400 mg by mouth once. 09/19/21   [provider]  fluticasone (FLONASE) 50 MCG/ACT nasal spray Place 2 sprays into both nostrils daily as needed (for stuffy nose). 06/22/18   Fletcher Anon, MD  fluticasone (FLOVENT HFA) 44 MCG/ACT inhaler Inhale 2 puffs into the lungs 2 (two) times daily. 05/11/18   Fletcher Anon, MD  hydrOXYzine (VISTARIL) 25 MG capsule Take 25 mg by mouth at bedtime as needed. 04/26/21   [provider]  Icosapent Ethyl (VASCEPA PO) Take by mouth.    [provider]  Lactobacillus Rhamnosus, GG, (CULTURELLE PO) Take by mouth.    [provider]  levalbuterol Pauline Aus  HFA) 45 MCG/ACT inhaler Inhale 2 puffs into the lungs every 4 (four) hours as needed for wheezing. 06/22/18   Fletcher Anon, MD  levalbuterol (XOPENEX) 1.25 MG/3ML nebulizer solution Take 1.25 mg by nebulization every 6 (six) hours as needed for wheezing or shortness of breath. 06/22/18   Fletcher Anon, MD  montelukast (SINGULAIR) 10 MG tablet Take 1 tablet (10 mg total) by mouth at bedtime. 06/06/20   Hetty Blend, FNP  nebivolol (BYSTOLIC) 5 MG tablet Take 5 mg by mouth daily.    [provider]  omeprazole (PRILOSEC) 40 MG capsule Take 40 mg by mouth daily.    [provider]  oxyCODONE-acetaminophen (PERCOCET) 7.5-325 MG tablet Take 1 tablet by mouth 3 (three) times daily as needed. 09/20/21   [provider]  phenazopyridine (PYRIDIUM) 200 MG tablet Take 200 mg by mouth 3 (three) times daily as needed for pain.    [provider]  polyvinyl alcohol (LIQUIFILM TEARS) 1.4 % ophthalmic solution Administer 1 drop to both eyes Three (3) times a day as needed for dry eyes.    [provider]  potassium chloride SA (K-DUR,KLOR-CON) 20 MEQ tablet Take 20 mEq by mouth daily. 01/17/14   [provider]  promethazine (PHENERGAN) 25 MG tablet Take 1 tablet (25 mg total) by mouth every 8 (eight) hours as needed for up to 14 days for nausea or vomiting. 09/26/21 10/10/21  Virgina Norfolk, DO  Spacer/Aero-Holding Chambers (AEROCHAMBER W/FLOWSIGNAL) inhaler Use as directed with inhaler 05/11/18   [provider]  Tiotropium Bromide Monohydrate (SPIRIVA RESPIMAT) 1.25 MCG/ACT AERS Take 2 puffs once daily for cough or wheeze. 06/13/20   Bobbitt, Heywood Iles, MD  triamcinolone cream (KENALOG) 0.1 % Apply topically. 07/20/15   [provider]  vitamin B-12 (CYANOCOBALAMIN) 100 MCG tablet Take by mouth. 12/01/08   [provider]    Allergies Budesonide-formoterol fumarate, Rocephin [ceftriaxone sodium in dextrose], Avandia [rosiglitazone],  Flagyl [metronidazole], Metformin, Tramadol hcl, Valdecoxib, Vancomycin, Amoxicillin, Atorvastatin, Avelox [moxifloxacin hcl in nacl], Baclofen, Cefdinir, Ceftin [cefuroxime axetil], Celebrex [celecoxib], Ciprofloxacin, Clarithromycin, Clindamycin, Clindamycin/lincomycin, Doxycycline, Erythromycin base, Latex, Levaquin [levofloxacin in d5w], Levofloxacin, Macrobid [nitrofurantoin monohyd macro], Macrodantin [nitrofurantoin macrocrystal], Metformin and related, Mirtazapine, Neurontin [gabapentin], Nitrofurantoin, Propoxyphene, Sulfa antibiotics, Tramadol hcl, Ultram [tramadol], Vioxx [rofecoxib], Voltaren [diclofenac sodium], Ertapenem, Other, Sulfacetamide sodium, and Tape   REVIEW OF SYSTEMS  Negative except as noted here or in the History of Present Illness.   PHYSICAL EXAMINATION  Initial Vital Signs Blood pressure 124/73, pulse 78, temperature 98.3 F (36.8 C), temperature source Oral, resp. rate 18, height 5\' 3"  (1.6 m), weight 83.5 kg, SpO2 97 %.  Examination General: Well-developed, well-nourished female in no acute distress; appearance consistent with age of record HENT: normocephalic; atraumatic Eyes: pupils equal, round and reactive to light; extraocular muscles intact Neck: supple Heart: regular rate and rhythm Lungs: clear to auscultation bilaterally Abdomen: soft; nondistended; right upper quadrant tenderness; bowel sounds present Extremities: No deformity; full range of motion; pulses normal Neurologic: Awake, alert and oriented; motor function intact in all extremities and symmetric; no facial droop Skin: Warm and dry Psychiatric: Normal mood and affect; circumstantial speech   RESULTS  Summary of this visit's results, reviewed and interpreted by myself:   EKG Interpretation  Date/Time:    Ventricular Rate:    PR Interval:    QRS Duration:   QT Interval:    QTC Calculation:   R Axis:     Text Interpretation:         Laboratory Studies: Results for orders  placed or performed during the hospital encounter of 09/26/21 (from the past 24 hour(s))  CBC with Differential/Platelet     Status: Abnormal   Collection Time: 09/26/21  6:37 AM  Result Value Ref Range   WBC 4.6 4.0 - 10.5 K/uL   RBC 3.77 (L) 3.87 - 5.11 MIL/uL   Hemoglobin 11.6 (L) 12.0 - 15.0 g/dL   HCT 76.5 (L) 46.5 - 03.5 %   MCV 92.0 80.0 - 100.0 fL   MCH 30.8 26.0 - 34.0 pg   MCHC 33.4 30.0 - 36.0 g/dL   RDW 46.5 68.1 - 27.5 %   Platelets 192 150 - 400 K/uL   nRBC 0.0 0.0 - 0.2 %   Neutrophils Relative % 30 %   Neutro Abs 1.4 (L) 1.7 - 7.7 K/uL   Lymphocytes Relative 53 %   Lymphs Abs 2.4 0.7 - 4.0 K/uL   Monocytes Relative 15 %   Monocytes Absolute 0.7 0.1 - 1.0 K/uL   Eosinophils Relative 2 %   Eosinophils Absolute 0.1 0.0 - 0.5 K/uL   Basophils Relative 0 %   Basophils Absolute 0.0 0.0 - 0.1 K/uL   Immature Granulocytes 0 %   Abs Immature Granulocytes 0.01 0.00 - 0.07 K/uL  Comprehensive metabolic panel     Status: None   Collection Time: 09/26/21  6:37 AM  Result Value Ref Range   Sodium 140 135 - 145 mmol/L   Potassium 3.7 3.5 - 5.1 mmol/L   Chloride 107 98 - 111 mmol/L   CO2 25 22 - 32 mmol/L   Glucose, Bld 87 70 - 99 mg/dL   BUN 9 6 - 20 mg/dL   Creatinine, Ser 1.70 0.44 - 1.00 mg/dL   Calcium 9.1 8.9 - 01.7 mg/dL   Total Protein 7.0 6.5 - 8.1 g/dL   Albumin 4.0 3.5 - 5.0 g/dL   AST 29 15 - 41 U/L   ALT 29 0 - 44 U/L   Alkaline Phosphatase 123 38 - 126 U/L   Total Bilirubin 0.6 0.3 - 1.2 mg/dL   GFR, Estimated >49 >44 mL/min   Anion gap 8 5 - 15  Lipase, blood     Status: None   Collection Time: 09/26/21  6:37 AM  Result Value Ref Range   Lipase 28 11 - 51 U/L   Imaging Studies: CT ABDOMEN PELVIS W CONTRAST  Result Date: 09/26/2021 CLINICAL DATA:  RIGHT-side abdominal pain, constipation for 4 days, nausea last night EXAM: CT ABDOMEN AND PELVIS WITH CONTRAST TECHNIQUE: Multidetector CT imaging of the abdomen and pelvis was performed using the standard  protocol following bolus administration of intravenous contrast. RADIATION DOSE REDUCTION: This exam was performed according to the departmental dose-optimization program which includes automated exposure control, adjustment of the mA and/or kV according to patient size and/or use of iterative reconstruction technique. CONTRAST:  100mL OMNIPAQUE IOHEXOL 300 MG/ML SOLN IV. No oral contrast. COMPARISON:  09/11/2020 FINDINGS: Lower chest: Lung bases clear Hepatobiliary: Fatty infiltration of liver. Gallbladder surgically absent. Pneumobilia present, unchanged question pre are ERCP with sphincterotomy. Pancreas: Normal appearance Spleen: Normal appearance Adrenals/Urinary Tract: Adrenal glands, kidneys, ureters, and bladder normal appearance Stomach/Bowel: Colonic diverticulosis from transverse colon distally without evidence of diverticulitis. Normal appendix. Bowel loops otherwise normal appearance. Large hiatal hernia with questionable wall thickening versus artifact from underdistention at the GE junction. Vascular/Lymphatic: Vascular structures patent.  No adenopathy. Reproductive: Uterus surgically absent with atrophic ovaries Other: No free air or free fluid. Tiny umbilical hernia containing fat. No acute inflammatory process. Musculoskeletal: Mildly demineralized. IMPRESSION: Colonic diverticulosis without evidence of diverticulitis. Large hiatal hernia with questionable wall thickening versus artifact from underdistention at the GE junction, recommend correlation with symptoms and consider follow-up upper endoscopy. Fatty infiltration of liver. Tiny umbilical hernia containing fat. No acute intra-abdominal or intrapelvic abnormalities. Electronically Signed   By: Ulyses SouthwardMark  Boles M.D.   On: 09/26/2021 08:30   US Abdomen Limited RUQ (LIVER/GB)  Result Date: 09/26/2021 CLINICAL DATA:  61 year old female with history of right upper quadrant abdominal pain and nausea. EXAM: ULTRASOUND ABDOMEN LIMITED RIGHT UPPER  QUADRANT COMPARISON:  No priors. FINDINGS: Gallbladder: Status post cholecystectomy. Common bile duct: Diameter: 2.3 mm. Liver: No focal lesion identified. Diffusely increased hepatic echogenicity, indicative of hepatic steatosis. Portal vein is patent on color Doppler imaging with normal direction of blood flow towards the liver. Other: None. IMPRESSION: 1. No acute findings. 2. Status post cholecystectomy. 3. Hepatic steatosis. Electronically Signed   By: Trudie Reedaniel  Entrikin M.D.   On: 09/26/2021 07:16    ED COURSE and MDM  Nursing notes, initial and subsequent vitals signs, including pulse oximetry, reviewed and interpreted by myself.  Vitals:   09/26/21 0715 09/26/21 0820 09/26/21 0830 09/26/21 0914  BP: (!) 123/108 124/85 111/79 107/72  Pulse: 73 66 68 72  Resp: 17 17  18   Temp:      TempSrc:      SpO2: 97% 99% 92% 99%  Weight:      Height:       Medications  ondansetron (ZOFRAN) injection 4 mg (4 mg Intravenous Given 09/26/21 [id number])  HYDROmorphone (DILAUDID) injection 1 mg (1 mg Intravenous Given 09/26/21 0638)  iohexol (OMNIPAQUE) 300 MG/ML solution 100 mL (100 mLs Intravenous Contrast Given 09/26/21 0801)   Since the patient's pain seems to have the right upper quadrant as its epicenter, and the pain is worse after eating I have a high suspicion of biliary colic.  She states she still has her gallbladder.  I was unable to visualize her gallbladder with the bedside ultrasound.  If right upper quadrant ultrasound nondiagnostic we will consider CT scan of the abdomen and pelvis.  7:00 AM Signed out to morning EDP. Diagnostic studies pending.    PROCEDURES  Procedures   ED DIAGNOSES     ICD-10-CM   1. RUQ abdominal pain  R10.11 US Abdomen Limited RUQ (LIVER/GB)    US Abdomen Limited RUQ (LIVER/GB)         Mianna Iezzi, MD 09/26/21 2239

## 2021-09-26 NOTE — ED Provider Notes (Signed)
Patient signed out to me at 7 AM.  Having upper abdominal pain.  CT scan and ultrasound per radiology with no acute findings.  She does have a large hiatal hernia.  Suspicion that she has acute on chronic reflux.  Will prescribe Protonix and Carafate.  Lab work is overall unremarkable.  No concern for cholecystitis or bowel obstruction or other acute process.   Virgina Norfolk, DO 09/26/21 [phone]

## 2021-09-26 NOTE — ED Notes (Signed)
EDP at bedside w/ US. 

## 2021-09-26 NOTE — ED Notes (Signed)
US at bedside

## 2021-09-26 NOTE — ED Triage Notes (Signed)
Pt c/o right sided abd pain and constipation. Pt states last BM last night after taking senokot.

## 2021-11-17 ENCOUNTER — Other Ambulatory Visit: Payer: Self-pay

## 2021-11-17 ENCOUNTER — Emergency Department (HOSPITAL_BASED_OUTPATIENT_CLINIC_OR_DEPARTMENT_OTHER): Payer: Medicare HMO

## 2021-11-17 ENCOUNTER — Encounter (HOSPITAL_BASED_OUTPATIENT_CLINIC_OR_DEPARTMENT_OTHER): Payer: Self-pay

## 2021-11-17 DIAGNOSIS — Z79899 Other long term (current) drug therapy: Secondary | ICD-10-CM | POA: Insufficient documentation

## 2021-11-17 DIAGNOSIS — R0602 Shortness of breath: Secondary | ICD-10-CM | POA: Insufficient documentation

## 2021-11-17 DIAGNOSIS — Z91040 Latex allergy status: Secondary | ICD-10-CM | POA: Insufficient documentation

## 2021-11-17 DIAGNOSIS — J449 Chronic obstructive pulmonary disease, unspecified: Secondary | ICD-10-CM | POA: Diagnosis not present

## 2021-11-17 DIAGNOSIS — R109 Unspecified abdominal pain: Secondary | ICD-10-CM | POA: Insufficient documentation

## 2021-11-17 DIAGNOSIS — E119 Type 2 diabetes mellitus without complications: Secondary | ICD-10-CM | POA: Diagnosis not present

## 2021-11-17 DIAGNOSIS — R0781 Pleurodynia: Secondary | ICD-10-CM | POA: Diagnosis not present

## 2021-11-17 DIAGNOSIS — Z7952 Long term (current) use of systemic steroids: Secondary | ICD-10-CM | POA: Diagnosis not present

## 2021-11-17 LAB — CBC
HCT: 36.7 % (ref 36.0–46.0)
Hemoglobin: 12.2 g/dL (ref 12.0–15.0)
MCH: 31.4 pg (ref 26.0–34.0)
MCHC: 33.2 g/dL (ref 30.0–36.0)
MCV: 94.6 fL (ref 80.0–100.0)
Platelets: 215 10*3/uL (ref 150–400)
RBC: 3.88 MIL/uL (ref 3.87–5.11)
RDW: 12.2 % (ref 11.5–15.5)
WBC: 4.5 10*3/uL (ref 4.0–10.5)
nRBC: 0 % (ref 0.0–0.2)

## 2021-11-17 LAB — BASIC METABOLIC PANEL
Anion gap: 9 (ref 5–15)
BUN: 11 mg/dL (ref 6–20)
CO2: 25 mmol/L (ref 22–32)
Calcium: 9.3 mg/dL (ref 8.9–10.3)
Chloride: 106 mmol/L (ref 98–111)
Creatinine, Ser: 0.81 mg/dL (ref 0.44–1.00)
GFR, Estimated: >60 mL/min (ref >60)
Glucose, Bld: 105 mg/dL — ABNORMAL HIGH (ref 70–99)
Potassium: 4 mmol/L (ref 3.5–5.1)
Sodium: 140 mmol/L (ref 135–145)

## 2021-11-17 LAB — TROPONIN I (HIGH SENSITIVITY): Troponin I (High Sensitivity): 2 ng/L (ref <18)

## 2021-11-17 NOTE — ED Triage Notes (Signed)
Pt states, "I think I had a heart attack last night."  Pt c/o pain in left chest after an argument w/son.  Pt is short of breath.  Pt states pain radiates into left shoulder and back, has had sweating, denies nausea and vomiting.   ?

## 2021-11-18 ENCOUNTER — Emergency Department (HOSPITAL_BASED_OUTPATIENT_CLINIC_OR_DEPARTMENT_OTHER)
Admission: EM | Admit: 2021-11-18 | Discharge: 2021-11-18 | Disposition: A | Discharge status: Home / Self Care | Payer: Medicare HMO | Attending: Emergency Medicine | Admitting: Emergency Medicine

## 2021-11-18 DIAGNOSIS — R072 Precordial pain: Secondary | ICD-10-CM

## 2021-11-18 DIAGNOSIS — R0781 Pleurodynia: Secondary | ICD-10-CM | POA: Diagnosis not present

## 2021-11-18 LAB — TROPONIN I (HIGH SENSITIVITY): Troponin I (High Sensitivity): 2 ng/L (ref <18)

## 2021-11-18 NOTE — ED Provider Notes (Signed)
Patient with multiple complaints with chest pain, back pain, arm pain, rash, many of which has been ongoing for months.  Patient also reports issues with her triglycerides.  She also reports she struggles with chronic pain.  At this point there is no emergent cardiopulmonary findings at this time.  Advised her to call her PCP later today for full head to toe evaluation ?  ?Zadie Rhine, MD ?11/18/21 0246 ? ?

## 2021-11-18 NOTE — ED Notes (Signed)
Patient assisted to use phone and call a family member for a ride home ?

## 2021-11-18 NOTE — ED Provider Notes (Signed)
[facility] EMERGENCY DEPARTMENT Provider Note   CSN: [id number] Arrival date & time: 11/17/21  2235     History  Chief Complaint  Patient presents with   Chest Pain    Holly Rogers is a 61 y.o. female.   Chest Pain Associated symptoms: abdominal pain and shortness of breath   Associated symptoms: no fever    HPI: A 61 year old patient with a history of treated diabetes and obesity presents for evaluation of chest pain. Initial onset of pain was approximately 3-6 hours ago. The patient's chest pain is not worse with exertion. The patient's chest pain is middle- or left-sided, is not well-localized, is not described as heaviness/pressure/tightness, is not sharp and does not radiate to the arms/jaw/neck. The patient does not complain of nausea and denies diaphoresis. The patient has no history of stroke, has no history of peripheral artery disease, has not smoked in the past 90 days, has no relevant family history of coronary artery disease (first degree relative at less than age 24), is not hypertensive and has no history of hypercholesterolemia.  Patient reports she started having pain in her abdomen that went into her chest after dinner.  She reports it felt like a "ball "in her chest She denies focal chest wall tenderness with palpation She reports shortness of breath She reports recent cardiac work-up Past Medical History:  Diagnosis Date   Acid reflux    Arthritis    COPD (chronic obstructive pulmonary disease) (HCC)    DDD (degenerative disc disease), lumbar    Diabetes mellitus without complication (HCC)    Diverticulitis    Diverticulosis    Fibromyalgia    Functional abdominal pain syndrome    Mitral valve prolapse    Pancreatitis 1997   Pneumonia    Sarcoidosis    Sepsis (HCC)     Home Medications Prior to Admission medications   Medication Sig Start Date End Date Taking? Authorizing Provider  atorvastatin (LIPITOR) 20 MG tablet Take 20 mg by mouth  daily.    [provider]  azelastine (ASTELIN) 0.1 % nasal spray Take 2 sprays per nostril twice daily as needed for runny nose 06/22/18   Fletcher Anon, MD  benzonatate (TESSALON) 100 MG capsule Take 100 mg by mouth 3 (three) times daily as needed for cough.    [provider]  Cholecalciferol (VITAMIN D-3 PO) Take by mouth.    [provider]  ciclesonide (ALVESCO) 80 MCG/ACT inhaler Inhale 2 puffs into the lungs 2 (two) times daily.    [provider]  diazepam (VALIUM) 10 MG tablet Take 10 mg by mouth 2 (two) times daily as needed. 09/25/21   [provider]  famciclovir (FAMVIR) 250 MG tablet Take 250 mg by mouth 2 (two) times daily. 05/25/20   [provider]  fluconazole (DIFLUCAN) 200 MG tablet Take 400 mg by mouth once. 09/19/21   [provider]  fluticasone (FLONASE) 50 MCG/ACT nasal spray Place 2 sprays into both nostrils daily as needed (for stuffy nose). 06/22/18   Fletcher Anon, MD  fluticasone (FLOVENT HFA) 44 MCG/ACT inhaler Inhale 2 puffs into the lungs 2 (two) times daily. 05/11/18   Fletcher Anon, MD  hydrOXYzine (VISTARIL) 25 MG capsule Take 25 mg by mouth at bedtime as needed. 04/26/21   [provider]  Icosapent Ethyl (VASCEPA PO) Take by mouth.    [provider]  Lactobacillus Rhamnosus, GG, (CULTURELLE PO) Take by mouth.    [provider]  levalbuterol (XOPENEX HFA) 45 MCG/ACT inhaler Inhale 2 puffs into the lungs every 4 (four) hours as needed for wheezing. 06/22/18   Fletcher Anon, MD  levalbuterol (XOPENEX) 1.25 MG/3ML nebulizer solution Take 1.25 mg by nebulization every 6 (six) hours as needed for wheezing or shortness of breath. 06/22/18   Fletcher Anon, MD  montelukast (SINGULAIR) 10 MG tablet Take 1 tablet (10 mg total) by mouth at bedtime. 06/06/20   Hetty Blend, FNP  naloxone [facility]) nasal spray 4 mg/0.1 mL Place into the nose. 05/31/19   [provider]   nebivolol (BYSTOLIC) 5 MG tablet Take 5 mg by mouth daily.    [provider]  omeprazole (PRILOSEC) 40 MG capsule Take 40 mg by mouth daily.    [provider]  oxyCODONE-acetaminophen (PERCOCET) 7.5-325 MG tablet Take 1 tablet by mouth 3 (three) times daily as needed. 09/20/21   [provider]  pantoprazole (PROTONIX) 20 MG tablet Take 1 tablet (20 mg total) by mouth daily for 14 days. 09/26/21 10/10/21  Curatolo, Adam, DO  phenazopyridine (PYRIDIUM) 200 MG tablet Take 200 mg by mouth 3 (three) times daily as needed for pain.    [provider]  polyvinyl alcohol (LIQUIFILM TEARS) 1.4 % ophthalmic solution Administer 1 drop to both eyes Three (3) times a day as needed for dry eyes.    [provider]  potassium chloride SA (K-DUR,KLOR-CON) 20 MEQ tablet Take 20 mEq by mouth daily. 01/17/14   [provider]  promethazine (PHENERGAN) 25 MG tablet Take 1 tablet (25 mg total) by mouth every 8 (eight) hours as needed for up to 14 days for nausea or vomiting. 09/26/21 10/10/21  Virgina Norfolk, DO  Spacer/Aero-Holding Chambers (AEROCHAMBER W/FLOWSIGNAL) inhaler Use as directed with inhaler 05/11/18   [provider]  sucralfate (CARAFATE) 1 g tablet Take 1 tablet (1 g total) by mouth 4 (four) times daily -  with meals and at bedtime for 14 days. 09/26/21 10/10/21  Curatolo, Adam, DO  Tiotropium Bromide Monohydrate (SPIRIVA RESPIMAT) 1.25 MCG/ACT AERS Take 2 puffs once daily for cough or wheeze. 06/13/20   Bobbitt, Heywood Iles, MD  triamcinolone cream (KENALOG) 0.1 % Apply topically. 07/20/15   [provider]  vitamin B-12 (CYANOCOBALAMIN) 100 MCG tablet Take by mouth. 12/01/08   [provider]      Allergies    Budesonide-formoterol fumarate, Rocephin [ceftriaxone sodium in dextrose], Avandia [rosiglitazone], Flagyl [metronidazole], Metformin, Tramadol hcl, Valdecoxib, Vancomycin, Amoxicillin, Atorvastatin, Avelox [moxifloxacin hcl  in nacl], Baclofen, Cefdinir, Ceftin [cefuroxime axetil], Celebrex [celecoxib], Ciprofloxacin, Clarithromycin, Clindamycin, Clindamycin/lincomycin, Doxycycline, Erythromycin base, Latex, Levaquin [levofloxacin in d5w], Levofloxacin, Macrobid [nitrofurantoin monohyd macro], Macrodantin [nitrofurantoin macrocrystal], Metformin and related, Mirtazapine, Neurontin [gabapentin], Nitrofurantoin, Propoxyphene, Sulfa antibiotics, Tramadol hcl, Ultram [tramadol], Vioxx [rofecoxib], Voltaren [diclofenac sodium], Ertapenem, Other, Sulfacetamide sodium, and Tape    Review of Systems   Review of Systems  Constitutional:  Negative for fever.  Respiratory:  Positive for shortness of breath.   Cardiovascular:  Positive for chest pain.  Gastrointestinal:  Positive for abdominal pain.   Physical Exam Updated Vital Signs BP 120/85    Pulse 69    Temp 98.4 F (36.9 C) (Oral)    Resp 18    Ht 1.6 m (5\' 3" )    Wt 84.8 kg    SpO2 96%    BMI 33.13 kg/m  Physical Exam CONSTITUTIONAL: Well developed/well nourished, appears mildly sedated HEAD: Normocephalic/atraumatic EYES: EOMI NECK: supple no meningeal signs SPINE/BACK:entire  spine nontender CV: S1/S2 noted, no murmurs/rubs/gallops noted LUNGS: Lungs are clear to auscultation bilaterally, no apparent distress Chest-tenderness noted to left chest ABDOMEN: soft, nontender NEURO: Pt is awake/alert/appropriate, moves all extremitiesx4.  No facial droop.   EXTREMITIES: pulses normal/equal, full ROM, no calf tenderness or edema SKIN: warm, color normal PSYCH: Flat affect  ED Results / Procedures / Treatments   Labs (all labs ordered are listed, but only abnormal results are displayed) Labs Reviewed  BASIC METABOLIC PANEL - Abnormal; Notable for the following components:      Result Value   Glucose, Bld 105 (*)    All other components within normal limits  CBC  TROPONIN I (HIGH SENSITIVITY)  TROPONIN I (HIGH SENSITIVITY)    EKG EKG  Interpretation  Date/Time:  Sunday November 17 2021 22:42:10 EST Ventricular Rate:  94 PR Interval:  180 QRS Duration: 76 QT Interval:  354 QTC Calculation: 442 R Axis:   63 Text Interpretation: Normal sinus rhythm Normal ECG When compared with ECG of 08-May-2021 04:35, No significant change since last tracing Interpretation limited secondary to artifact Confirmed by Zadie Rhine ([id number]) on 11/18/2021 1:00:10 AM  Radiology DG Chest 2 View  Result Date: 11/17/2021 CLINICAL DATA:  Chest pain. EXAM: CHEST - 2 VIEW COMPARISON:  Chest x-ray 05/08/2021 FINDINGS: Large hiatal hernia is again seen with air-fluid level, unchanged. The lungs and costophrenic angles are clear. No pleural effusion or pneumothorax identified. Cardiomediastinal silhouette is within normal limits. No acute fractures are seen. IMPRESSION: 1. No acute cardiopulmonary process. 2. Stable large hiatal hernia. Electronically Signed   By: Darliss Cheney M.D.   On: 11/17/2021 23:04    Procedures Procedures    Medications Ordered in ED Medications - No data to display  ED Course/ Medical Decision Making/ A&P Clinical Course as of 11/18/21 0233  Mon Nov 18, 2021  0232 Patient had a recent stress imaging that was unremarkable [DW]  0233 Patient is overall in no distress, work-up has  been unremarkable.  I have low suspicion for ACS/PE/dissection at this time [DW]    Clinical Course User Index [DW] Zadie Rhine, MD   HEAR Score: 2                       Medical Decision Making Amount and/or Complexity of Data Reviewed Labs: ordered. Radiology: ordered.   This patient presents to the ED for concern of chest pain, this involves an extensive number of treatment options, and is a complaint that carries with it a high risk of complications and morbidity.  The differential diagnosis includes acute coronary syndrome, PE, aortic dissection, pneumothorax, pneumonia, chest wall strain  Comorbidities that complicate the patient  evaluation: Patients presentation is complicated by their history of diabetes  Social Determinants of Health: Patients  history of chronic pain   increases the complexity of managing their presentation  Additional history obtained:  Records reviewed Care Everywhere/External Records  Lab Tests: Labs are unremarkable  Imaging Studies ordered: I ordered imaging studies including X-ray chest   I independently visualized and interpreted imaging which showed unremarkable no fractures or acute findings I agree with the radiologist interpretation  Cardiac Monitoring: The patient was maintained on a cardiac monitor.  I personally viewed and interpreted the cardiac monitor which showed an underlying rhythm of:  sinus rhythm  Test Considered: Patient is low risk / negative by HEAR score, therefore do not feel that admission is indicated.   Reevaluation: After the interventions noted above,  I reevaluated the patient and found that they have :stayed the same  Complexity of problems addressed: Patients presentation is most consistent with  acute presentation with potential threat to life or bodily function  Disposition: After consideration of the diagnostic results and the patients response to treatment,  I feel that the patent would benefit from discharge   .           Final Clinical Impression(s) / ED Diagnoses Final diagnoses:  Precordial pain    Rx / DC Orders ED Discharge Orders     None         Zadie RhineWickline, Ahmeer Tuman, MD 11/18/21 [phone]

## 2021-11-18 NOTE — ED Notes (Signed)
Patient ambulatory to restroom. Steady gait noted.

## 2021-11-18 NOTE — ED Notes (Signed)
Patient updated on plan of care

## 2021-11-18 NOTE — Discharge Instructions (Signed)

## 2022-01-06 ENCOUNTER — Encounter (HOSPITAL_BASED_OUTPATIENT_CLINIC_OR_DEPARTMENT_OTHER): Payer: Self-pay | Admitting: Emergency Medicine

## 2022-01-06 ENCOUNTER — Emergency Department (HOSPITAL_BASED_OUTPATIENT_CLINIC_OR_DEPARTMENT_OTHER): Payer: Medicare HMO

## 2022-01-06 ENCOUNTER — Emergency Department (HOSPITAL_BASED_OUTPATIENT_CLINIC_OR_DEPARTMENT_OTHER)
Admission: EM | Admit: 2022-01-06 | Discharge: 2022-01-07 | Disposition: A | Discharge status: Home / Self Care | Payer: Medicare HMO | Attending: Emergency Medicine | Admitting: Emergency Medicine

## 2022-01-06 ENCOUNTER — Other Ambulatory Visit: Payer: Self-pay

## 2022-01-06 DIAGNOSIS — K760 Fatty (change of) liver, not elsewhere classified: Secondary | ICD-10-CM | POA: Diagnosis not present

## 2022-01-06 DIAGNOSIS — R42 Dizziness and giddiness: Secondary | ICD-10-CM | POA: Diagnosis not present

## 2022-01-06 DIAGNOSIS — R1011 Right upper quadrant pain: Secondary | ICD-10-CM

## 2022-01-06 DIAGNOSIS — Z91040 Latex allergy status: Secondary | ICD-10-CM | POA: Diagnosis not present

## 2022-01-06 DIAGNOSIS — R110 Nausea: Secondary | ICD-10-CM | POA: Diagnosis not present

## 2022-01-06 LAB — CBC WITH DIFFERENTIAL/PLATELET
Abs Immature Granulocytes: 0.01 10*3/uL (ref 0.00–0.07)
Basophils Absolute: 0 10*3/uL (ref 0.0–0.1)
Basophils Relative: 0 %
Eosinophils Absolute: 0.1 10*3/uL (ref 0.0–0.5)
Eosinophils Relative: 2 %
HCT: 39.6 % (ref 36.0–46.0)
Hemoglobin: 13.1 g/dL (ref 12.0–15.0)
Immature Granulocytes: 0 %
Lymphocytes Relative: 43 %
Lymphs Abs: 2 10*3/uL (ref 0.7–4.0)
MCH: 31.3 pg (ref 26.0–34.0)
MCHC: 33.1 g/dL (ref 30.0–36.0)
MCV: 94.5 fL (ref 80.0–100.0)
Monocytes Absolute: 0.5 10*3/uL (ref 0.1–1.0)
Monocytes Relative: 11 %
Neutro Abs: 2.1 10*3/uL (ref 1.7–7.7)
Neutrophils Relative %: 44 %
Platelets: 216 10*3/uL (ref 150–400)
RBC: 4.19 MIL/uL (ref 3.87–5.11)
RDW: 12.1 % (ref 11.5–15.5)
WBC: 4.7 10*3/uL (ref 4.0–10.5)
nRBC: 0 % (ref 0.0–0.2)

## 2022-01-06 LAB — COMPREHENSIVE METABOLIC PANEL
ALT: 50 U/L — ABNORMAL HIGH (ref 0–44)
AST: 57 U/L — ABNORMAL HIGH (ref 15–41)
Albumin: 4.3 g/dL (ref 3.5–5.0)
Alkaline Phosphatase: 118 U/L (ref 38–126)
Anion gap: 7 (ref 5–15)
BUN: 11 mg/dL (ref 6–20)
CO2: 26 mmol/L (ref 22–32)
Calcium: 9.4 mg/dL (ref 8.9–10.3)
Chloride: 107 mmol/L (ref 98–111)
Creatinine, Ser: 0.69 mg/dL (ref 0.44–1.00)
GFR, Estimated: >60 mL/min (ref >60)
Glucose, Bld: 91 mg/dL (ref 70–99)
Potassium: 4.1 mmol/L (ref 3.5–5.1)
Sodium: 140 mmol/L (ref 135–145)
Total Bilirubin: 0.4 mg/dL (ref 0.3–1.2)
Total Protein: 7.5 g/dL (ref 6.5–8.1)

## 2022-01-06 LAB — LIPASE, BLOOD: Lipase: 35 U/L (ref 11–51)

## 2022-01-06 NOTE — ED Triage Notes (Signed)
Pt c/o right sided abdominal pain, nausea, dizziness, and weakness. Endorses burning with urination. Recently on flagyl. Denies vomiting. Took percocet at 2 pm today with no relief.   ?

## 2022-01-06 NOTE — ED Provider Notes (Signed)
?[facility] EMERGENCY DEPARTMENT ?Provider Note ? ? ?CSN: [id number] ?Arrival date & time: 01/06/22  2050 ? ?  ? ?History ? ?Chief Complaint  ?Patient presents with  ? Abdominal Pain  ? ? ?Holly Rogers is a 61 y.o. female. ? ?61 y.o female with a Pmh of enlarged spleen, fibromyalgia, sarcoidosis, MVP, pancreatitis presents to the ED with a chief complaint of generalized right-sided abdominal pain that is been ongoing for the past 3 days.  Patient reports she picked up her girlfriend from the ground approximately a month ago, felt like something pulled on the right upper quadrant, reports since she has continued to feel pain along the right side of her abdomen.  This pain is worse with any movement along with palpation.  She describes her pain as "excruciating ", rating it about a 9 out of 10.  Patient is currently followed by pain management due to ongoing fibromyalgia.  She does take Percocet 7.5 mg 3 times daily, reports waking up on Friday and feeling dizzy.  She also reports feeling nauseated.  She is currently taking Flagyl for bacterial infection and reports no improvement in her symptoms.  She does have a prior history of perforation, reports this feels somewhat like it.  No fever, no nausea, no vomiting, no chest pain, no shortness of breath. ? ?The history is provided by the patient.  ?Abdominal Pain ?Pain location:  R flank ?Pain quality: aching   ?Pain radiates to:  RUQ ?Pain severity:  Mild ?Onset quality:  Sudden ?Duration:  3 days ?Timing:  Constant ?Associated symptoms: no chest pain, no chills, no fever, no nausea, no shortness of breath, no sore throat and no vomiting   ? ?  ? ?Home Medications ?Prior to Admission medications   ?Medication Sig Start Date End Date Taking? Authorizing Provider  ?atorvastatin (LIPITOR) 20 MG tablet Take 20 mg by mouth daily.    [provider]  ?azelastine (ASTELIN) 0.1 % nasal spray Take 2 sprays per nostril twice daily as needed for runny nose  06/22/18   Fletcher Anon, MD  ?benzonatate (TESSALON) 100 MG capsule Take 100 mg by mouth 3 (three) times daily as needed for cough.    [provider]  ?Cholecalciferol (VITAMIN D-3 PO) Take by mouth.    [provider]  ?ciclesonide (ALVESCO) 80 MCG/ACT inhaler Inhale 2 puffs into the lungs 2 (two) times daily.    [provider]  ?diazepam (VALIUM) 10 MG tablet Take 10 mg by mouth 2 (two) times daily as needed. 09/25/21   [provider]  ?famciclovir (FAMVIR) 250 MG tablet Take 250 mg by mouth 2 (two) times daily. 05/25/20   [provider]  ?fluconazole (DIFLUCAN) 200 MG tablet Take 400 mg by mouth once. 09/19/21   [provider]  ?fluticasone (FLONASE) 50 MCG/ACT nasal spray Place 2 sprays into both nostrils daily as needed (for stuffy nose). 06/22/18   Fletcher Anon, MD  ?fluticasone (FLOVENT HFA) 44 MCG/ACT inhaler Inhale 2 puffs into the lungs 2 (two) times daily. 05/11/18   Fletcher Anon, MD  ?hydrOXYzine (VISTARIL) 25 MG capsule Take 25 mg by mouth at bedtime as needed. 04/26/21   [provider]  ?Icosapent Ethyl (VASCEPA PO) Take by mouth.    [provider]  ?Lactobacillus Rhamnosus, GG, (CULTURELLE PO) Take by mouth.    [provider]  ?levalbuterol Pauline [facility] HFA) 45 MCG/ACT inhaler Inhale 2 puffs into the lungs every 4 (four) hours as needed for  wheezing. 06/22/18   Fletcher AnonBardelas, Jose A, MD  ?levalbuterol Pauline Aus(XOPENEX) 1.25 MG/3ML nebulizer solution Take 1.25 mg by nebulization every 6 (six) hours as needed for wheezing or shortness of breath. 06/22/18   Fletcher AnonBardelas, Jose A, MD  ?montelukast (SINGULAIR) 10 MG tablet Take 1 tablet (10 mg total) by mouth at bedtime. 06/06/20   Hetty BlendAmbs, Anne M, FNP  ?naloxone [facility](NARCAN) nasal spray 4 mg/0.1 mL Place into the nose. 05/31/19   [provider]  ?nebivolol (BYSTOLIC) 5 MG tablet Take 5 mg by mouth daily.    [provider]  ?omeprazole (PRILOSEC) 40 MG capsule Take 40 mg by  mouth daily.    [provider]  ?oxyCODONE-acetaminophen (PERCOCET) 7.5-325 MG tablet Take 1 tablet by mouth 3 (three) times daily as needed. 09/20/21   [provider]  ?pantoprazole (PROTONIX) 20 MG tablet Take 1 tablet (20 mg total) by mouth daily for 14 days. 09/26/21 10/10/21  Virgina Norfolkuratolo, Adam, DO  ?phenazopyridine (PYRIDIUM) 200 MG tablet Take 200 mg by mouth 3 (three) times daily as needed for pain.    [provider]  ?polyvinyl alcohol (LIQUIFILM TEARS) 1.4 % ophthalmic solution Administer 1 drop to both eyes Three (3) times a day as needed for dry eyes.    [provider]  ?potassium chloride SA (K-DUR,KLOR-CON) 20 MEQ tablet Take 20 mEq by mouth daily. 01/17/14   [provider]  ?promethazine (PHENERGAN) 25 MG tablet Take 1 tablet (25 mg total) by mouth every 8 (eight) hours as needed for up to 14 days for nausea or vomiting. 09/26/21 10/10/21  Virgina Norfolkuratolo, Adam, DO  ?Spacer/Aero-Holding Chambers (AEROCHAMBER W/FLOWSIGNAL) inhaler Use as directed with inhaler 05/11/18   [provider]  ?sucralfate (CARAFATE) 1 g tablet Take 1 tablet (1 g total) by mouth 4 (four) times daily -  with meals and at bedtime for 14 days. 09/26/21 10/10/21  Virgina Norfolkuratolo, Adam, DO  ?Tiotropium Bromide Monohydrate (SPIRIVA RESPIMAT) 1.25 MCG/ACT AERS Take 2 puffs once daily for cough or wheeze. 06/13/20   Bobbitt, Heywood Ilesalph Carter, MD  ?triamcinolone cream (KENALOG) 0.1 % Apply topically. 07/20/15   [provider]  ?vitamin B-12 (CYANOCOBALAMIN) 100 MCG tablet Take by mouth. 12/01/08   [provider]  ?   ? ?Allergies    ?Budesonide-formoterol fumarate, Rocephin [ceftriaxone sodium in dextrose], Avandia [rosiglitazone], Flagyl [metronidazole], Metformin, Tramadol hcl, Valdecoxib, Vancomycin, Amoxicillin, Atorvastatin, Avelox [moxifloxacin hcl in nacl], Baclofen, Cefdinir, Ceftin [cefuroxime axetil], Celebrex [celecoxib], Ciprofloxacin, Clarithromycin, Clindamycin,  Clindamycin/lincomycin, Doxycycline, Erythromycin base, Latex, Levaquin [levofloxacin in d5w], Levofloxacin, Macrobid [nitrofurantoin monohyd macro], Macrodantin [nitrofurantoin macrocrystal], Metformin and related, Mirtazapine, Neurontin [gabapentin], Nitrofurantoin, Propoxyphene, Sulfa antibiotics, Tramadol hcl, Ultram [tramadol], Vioxx [rofecoxib], Voltaren [diclofenac sodium], Ertapenem, Other, Sulfacetamide sodium, and Tape   ? ?Review of Systems   ?Review of Systems  ?Constitutional:  Negative for chills and fever.  ?HENT:  Negative for sore throat.   ?Respiratory:  Negative for shortness of breath.   ?Cardiovascular:  Negative for chest pain.  ?Gastrointestinal:  Positive for abdominal pain. Negative for nausea and vomiting.  ?Genitourinary:  Negative for flank pain.  ?Musculoskeletal:  Negative for back pain.  ?Skin:  Negative for pallor and wound.  ?Neurological:  Negative for light-headedness and headaches.  ?All other systems reviewed and are negative. ? ?Physical Exam ?Updated Vital Signs ?BP 96/64   Pulse 67   Temp 97.8 ?F (36.6 ?C) (Oral)   Resp 16   SpO2 98%  ?Physical Exam ?Vitals and nursing note reviewed.  ?Constitutional:   ?  Appearance: She is well-developed.  ?HENT:  ?   Head: Normocephalic and atraumatic.  ?Cardiovascular:  ?   Rate and Rhythm: Normal rate.  ?Pulmonary:  ?   Effort: Pulmonary effort is normal.  ?   Breath sounds: No wheezing.  ?Abdominal:  ?   General: Abdomen is flat. Bowel sounds are decreased.  ?   Palpations: Abdomen is soft. There is no hepatomegaly.  ?   Tenderness: There is abdominal tenderness in the right upper quadrant. Positive signs include Murphy's sign.  ?Skin: ?   General: Skin is warm and dry.  ?Neurological:  ?   Mental Status: She is alert.  ? ? ?ED Results / Procedures / Treatments   ?Labs ?(all labs ordered are listed, but only abnormal results are displayed) ?Labs Reviewed  ?COMPREHENSIVE METABOLIC PANEL - Abnormal; Notable for the following  components:  ?    Result Value  ? AST 57 (*)   ? ALT 50 (*)   ? All other components within normal limits  ?LIPASE, BLOOD  ?CBC WITH DIFFERENTIAL/PLATELET  ?URINALYSIS, ROUTINE W REFLEX MICROSCOPIC  ? ? ?EKG ?None ? ?Radiology ?US Abdomen

## 2022-01-07 DIAGNOSIS — K760 Fatty (change of) liver, not elsewhere classified: Secondary | ICD-10-CM | POA: Diagnosis not present

## 2022-01-07 MED ORDER — MORPHINE SULFATE (PF) 4 MG/ML IV SOLN
4.0000 mg | Freq: Once | INTRAVENOUS | Status: AC
  Administered 2022-01-07: 4 mg via INTRAVENOUS
  Filled 2022-01-07: qty 1

## 2022-01-07 MED ORDER — PROCHLORPERAZINE EDISYLATE 10 MG/2ML IJ SOLN
10.0000 mg | Freq: Once | INTRAMUSCULAR | Status: AC
  Administered 2022-01-07: 10 mg via INTRAVENOUS
  Filled 2022-01-07: qty 2

## 2022-01-07 MED ORDER — SODIUM CHLORIDE 0.9 % IV BOLUS
1000.0000 mL | Freq: Once | INTRAVENOUS | Status: AC
  Administered 2022-01-07: 1000 mL via INTRAVENOUS

## 2022-01-07 NOTE — Discharge Instructions (Addendum)
Your laboratory results are within normal limits today.  There is a slight elevation in your liver enzymes.  You will need to follow-up with your primary care physician for these. ? ?I do agree that you do need an endoscopy along with a colonoscopy, please schedule an appointment with your gastroenterologist. ?

## 2022-02-12 ENCOUNTER — Other Ambulatory Visit (HOSPITAL_COMMUNITY): Payer: Self-pay | Admitting: Family Medicine

## 2022-02-12 DIAGNOSIS — R079 Chest pain, unspecified: Secondary | ICD-10-CM

## 2022-03-20 ENCOUNTER — Other Ambulatory Visit (HOSPITAL_COMMUNITY): Payer: Self-pay | Admitting: *Deleted

## 2022-03-20 MED ORDER — METOPROLOL TARTRATE 100 MG PO TABS: ORAL_TABLET | ORAL | 0 refills | Status: AC

## 2022-03-21 ENCOUNTER — Telehealth (HOSPITAL_COMMUNITY): Payer: Self-pay | Admitting: *Deleted

## 2022-03-21 NOTE — Telephone Encounter (Signed)
Attempted to call patient regarding upcoming cardiac CT appointment. °Left message on voicemail with name and callback number ° °Aubriel Khanna RN Navigator Cardiac Imaging °[facility] °[phone] Office °[phone] Cell ° °

## 2022-03-21 NOTE — Telephone Encounter (Signed)
Patient returning call regarding upcoming cardiac imaging study; pt verbalizes understanding of appt date/time, parking situation and where to check in, pre-test NPO status and medications ordered; name and call back number provided for further questions should they arise  Larey Brick RN Navigator Cardiac Imaging [facility] [phone] office [phone] cell  Patient to take 100mg  metoprolol tartrate two hours prior to her cardiac CT scan. She is aware to arrive at 3:30pm. Confirmed with patient that she is NOT allergic to IV contrast.

## 2022-03-24 ENCOUNTER — Ambulatory Visit (HOSPITAL_COMMUNITY): Admission: RE | Admit: 2022-03-24 | Payer: Medicare HMO | Source: Ambulatory Visit

## 2022-05-20 ENCOUNTER — Telehealth (HOSPITAL_COMMUNITY): Payer: Self-pay | Admitting: Emergency Medicine

## 2022-05-20 NOTE — Telephone Encounter (Signed)
Reaching out to patient to offer assistance regarding upcoming cardiac imaging study; pt verbalizes understanding of appt date/time, parking situation and where to check in, pre-test NPO status and medications ordered, and verified current allergies; name and call back number provided for further questions should they arise Rockwell Alexandria RN Navigator Cardiac Imaging [facility] [phone] office [phone] cell  Arrival 330 w/c entrance Difficult IV 100mg  metop Holding allergy meds

## 2022-05-21 ENCOUNTER — Ambulatory Visit (HOSPITAL_COMMUNITY)
Admission: RE | Admit: 2022-05-21 | Discharge: 2022-05-21 | Disposition: A | Discharge status: Home / Self Care | Payer: Medicare HMO | Source: Ambulatory Visit | Attending: Family Medicine | Admitting: Family Medicine

## 2022-05-21 DIAGNOSIS — R931 Abnormal findings on diagnostic imaging of heart and coronary circulation: Secondary | ICD-10-CM | POA: Diagnosis not present

## 2022-05-21 DIAGNOSIS — I2510 Atherosclerotic heart disease of native coronary artery without angina pectoris: Secondary | ICD-10-CM | POA: Diagnosis not present

## 2022-05-21 DIAGNOSIS — R079 Chest pain, unspecified: Secondary | ICD-10-CM | POA: Insufficient documentation

## 2022-05-21 DIAGNOSIS — K449 Diaphragmatic hernia without obstruction or gangrene: Secondary | ICD-10-CM | POA: Insufficient documentation

## 2022-05-21 DIAGNOSIS — K760 Fatty (change of) liver, not elsewhere classified: Secondary | ICD-10-CM | POA: Diagnosis not present

## 2022-05-21 MED ORDER — METOPROLOL TARTRATE 5 MG/5ML IV SOLN
INTRAVENOUS | Status: AC
  Administered 2022-05-21: 5 mg via INTRAVENOUS
  Filled 2022-05-21: qty 10

## 2022-05-21 MED ORDER — METOPROLOL TARTRATE 5 MG/5ML IV SOLN
5.0000 mg | INTRAVENOUS | Status: DC | PRN
Start: 2022-05-21 — End: 2022-05-22
  Administered 2022-05-21 (×2): 5 mg via INTRAVENOUS

## 2022-05-21 MED ORDER — IOHEXOL 350 MG/ML SOLN
100.0000 mL | Freq: Once | INTRAVENOUS | Status: AC | PRN
  Administered 2022-05-21: 100 mL via INTRAVENOUS

## 2022-05-21 MED ORDER — NITROGLYCERIN 0.4 MG SL SUBL
SUBLINGUAL_TABLET | SUBLINGUAL | Status: AC
  Administered 2022-05-21: 0.8 mg via SUBLINGUAL
  Filled 2022-05-21: qty 2

## 2022-05-21 MED ORDER — DILTIAZEM HCL 25 MG/5ML IV SOLN
INTRAVENOUS | Status: AC
  Filled 2022-05-21: qty 5

## 2022-05-21 MED ORDER — NITROGLYCERIN 0.4 MG SL SUBL: 0.8000 mg | SUBLINGUAL_TABLET | Freq: Once | SUBLINGUAL | Status: AC

## 2023-01-12 ENCOUNTER — Emergency Department (HOSPITAL_BASED_OUTPATIENT_CLINIC_OR_DEPARTMENT_OTHER): Payer: Medicare HMO

## 2023-01-12 ENCOUNTER — Encounter (HOSPITAL_BASED_OUTPATIENT_CLINIC_OR_DEPARTMENT_OTHER): Payer: Self-pay

## 2023-01-12 ENCOUNTER — Other Ambulatory Visit: Payer: Self-pay

## 2023-01-12 ENCOUNTER — Emergency Department (HOSPITAL_BASED_OUTPATIENT_CLINIC_OR_DEPARTMENT_OTHER)
Admission: EM | Admit: 2023-01-12 | Discharge: 2023-01-13 | Disposition: A | Discharge status: Home / Self Care | Payer: Medicare HMO | Attending: Emergency Medicine | Admitting: Emergency Medicine

## 2023-01-12 DIAGNOSIS — J449 Chronic obstructive pulmonary disease, unspecified: Secondary | ICD-10-CM | POA: Insufficient documentation

## 2023-01-12 DIAGNOSIS — E119 Type 2 diabetes mellitus without complications: Secondary | ICD-10-CM | POA: Diagnosis not present

## 2023-01-12 DIAGNOSIS — Z91040 Latex allergy status: Secondary | ICD-10-CM | POA: Diagnosis not present

## 2023-01-12 DIAGNOSIS — R079 Chest pain, unspecified: Secondary | ICD-10-CM

## 2023-01-12 DIAGNOSIS — Z7951 Long term (current) use of inhaled steroids: Secondary | ICD-10-CM | POA: Insufficient documentation

## 2023-01-12 DIAGNOSIS — Z79899 Other long term (current) drug therapy: Secondary | ICD-10-CM | POA: Diagnosis not present

## 2023-01-12 LAB — BASIC METABOLIC PANEL
Anion gap: 9 (ref 5–15)
BUN: 11 mg/dL (ref 6–20)
CO2: 22 mmol/L (ref 22–32)
Calcium: 9.2 mg/dL (ref 8.9–10.3)
Chloride: 108 mmol/L (ref 98–111)
Creatinine, Ser: 0.9 mg/dL (ref 0.44–1.00)
GFR, Estimated: >60 mL/min (ref >60)
Glucose, Bld: 115 mg/dL — ABNORMAL HIGH (ref 70–99)
Potassium: 3.6 mmol/L (ref 3.5–5.1)
Sodium: 139 mmol/L (ref 135–145)

## 2023-01-12 LAB — TROPONIN I (HIGH SENSITIVITY): Troponin I (High Sensitivity): 3 ng/L (ref <18)

## 2023-01-12 LAB — CBC
HCT: 38.5 % (ref 36.0–46.0)
Hemoglobin: 13 g/dL (ref 12.0–15.0)
MCH: 31.8 pg (ref 26.0–34.0)
MCHC: 33.8 g/dL (ref 30.0–36.0)
MCV: 94.1 fL (ref 80.0–100.0)
Platelets: 233 10*3/uL (ref 150–400)
RBC: 4.09 MIL/uL (ref 3.87–5.11)
RDW: 12 % (ref 11.5–15.5)
WBC: 4.8 10*3/uL (ref 4.0–10.5)
nRBC: 0 % (ref 0.0–0.2)

## 2023-01-12 MED ORDER — HYDROCODONE-ACETAMINOPHEN 5-325 MG PO TABS
2.0000 | ORAL_TABLET | Freq: Once | ORAL | Status: AC
  Administered 2023-01-12: 2 via ORAL
  Filled 2023-01-12: qty 2

## 2023-01-12 NOTE — ED Notes (Signed)
Pt describes CP that radiates to left arm and under axilla.   Does have hx of mitral valve prolapse

## 2023-01-12 NOTE — ED Triage Notes (Signed)
Pt had CP yesterday @ 1130 in morning - woke her up from dead sleep - "felt like a lightning bolt".  Has hx of Angina.  Pain has not alleviated.  Pt

## 2023-01-12 NOTE — ED Provider Notes (Incomplete)
[place] [facility] AT [facility] Provider Note   CSN: [id number] Arrival date & time: 01/12/23  1951     History {Add pertinent medical, surgical, social history, OB history to HPI:1} Chief Complaint  Patient presents with  . Chest Pain   HPI Holly Rogers is a 62 y.o. female with history of sarcoidosis, COPD, mitral valve prolapse and fibromyalgia and diabetes presenting for chest pain.  Started acutely at 11:30 AM yesterday.  Started centrally and was a sharp stabbing pain that radiated across her left side of her chest and down her arm.  "Felt like a lightening bolt". Has been very painful and constant.  Denies associated shortness of breath.  Denies cough congestion.   Chest Pain      Home Medications Prior to Admission medications   Medication Sig Start Date End Date Taking? Authorizing Provider  atorvastatin (LIPITOR) 20 MG tablet Take 20 mg by mouth daily.    [provider]  azelastine (ASTELIN) 0.1 % nasal spray Take 2 sprays per nostril twice daily as needed for runny nose 06/22/18   Fletcher Anon, MD  benzonatate (TESSALON) 100 MG capsule Take 100 mg by mouth 3 (three) times daily as needed for cough.    [provider]  Cholecalciferol (VITAMIN D-3 PO) Take by mouth.    [provider]  ciclesonide (ALVESCO) 80 MCG/ACT inhaler Inhale 2 puffs into the lungs 2 (two) times daily.    [provider]  diazepam (VALIUM) 10 MG tablet Take 10 mg by mouth 2 (two) times daily as needed. 09/25/21   [provider]  famciclovir (FAMVIR) 250 MG tablet Take 250 mg by mouth 2 (two) times daily. 05/25/20   [provider]  fluconazole (DIFLUCAN) 200 MG tablet Take 400 mg by mouth once. 09/19/21   [provider]  fluticasone (FLONASE) 50 MCG/ACT nasal spray Place 2 sprays into both nostrils daily as needed (for stuffy nose). 06/22/18   Fletcher Anon, MD  fluticasone (FLOVENT HFA) 44 MCG/ACT inhaler  Inhale 2 puffs into the lungs 2 (two) times daily. 05/11/18   Fletcher Anon, MD  hydrOXYzine (VISTARIL) 25 MG capsule Take 25 mg by mouth at bedtime as needed. 04/26/21   [provider]  Icosapent Ethyl (VASCEPA PO) Take by mouth.    [provider]  Lactobacillus Rhamnosus, GG, (CULTURELLE PO) Take by mouth.    [provider]  levalbuterol Pauline Aus HFA) 45 MCG/ACT inhaler Inhale 2 puffs into the lungs every 4 (four) hours as needed for wheezing. 06/22/18   Fletcher Anon, MD  levalbuterol (XOPENEX) 1.25 MG/3ML nebulizer solution Take 1.25 mg by nebulization every 6 (six) hours as needed for wheezing or shortness of breath. 06/22/18   Fletcher Anon, MD  metoprolol tartrate (LOPRESSOR) 100 MG tablet Take tablet (100mg ) TWO hours prior to your cardiac CT scan. 03/20/22   Sande Rives, MD  montelukast (SINGULAIR) 10 MG tablet Take 1 tablet (10 mg total) by mouth at bedtime. 06/06/20   Hetty Blend, FNP  naloxone [facility]) nasal spray 4 mg/0.1 mL Place into the nose. 05/31/19   [provider]  nebivolol (BYSTOLIC) 5 MG tablet Take 5 mg by mouth daily.    [provider]  omeprazole (PRILOSEC) 40 MG capsule Take 40 mg by mouth daily.    [provider]  oxyCODONE-acetaminophen (PERCOCET) 7.5-325 MG tablet Take 1 tablet by mouth 3 (three) times daily as needed. 09/20/21   [provider]  pantoprazole (PROTONIX) 20 MG tablet Take 1 tablet (20 mg total) by mouth daily for 14 days. 09/26/21 10/10/21  Curatolo, Adam, DO  phenazopyridine (PYRIDIUM) 200 MG tablet Take 200 mg by mouth 3 (three) times daily as needed for pain.    [provider]  polyvinyl alcohol (LIQUIFILM TEARS) 1.4 % ophthalmic solution Administer 1 drop to both eyes Three (3) times a day as needed for dry eyes.    [provider]  potassium chloride SA (K-DUR,KLOR-CON) 20 MEQ tablet Take 20 mEq by mouth daily. 01/17/14   [provider]   promethazine (PHENERGAN) 25 MG tablet Take 1 tablet (25 mg total) by mouth every 8 (eight) hours as needed for up to 14 days for nausea or vomiting. 09/26/21 10/10/21  Virgina Norfolk, DO  Spacer/Aero-Holding Chambers (AEROCHAMBER W/FLOWSIGNAL) inhaler Use as directed with inhaler 05/11/18   [provider]  sucralfate (CARAFATE) 1 g tablet Take 1 tablet (1 g total) by mouth 4 (four) times daily -  with meals and at bedtime for 14 days. 09/26/21 10/10/21  Curatolo, Adam, DO  Tiotropium Bromide Monohydrate (SPIRIVA RESPIMAT) 1.25 MCG/ACT AERS Take 2 puffs once daily for cough or wheeze. 06/13/20   Bobbitt, Heywood Iles, MD  triamcinolone cream (KENALOG) 0.1 % Apply topically. 07/20/15   [provider]  vitamin B-12 (CYANOCOBALAMIN) 100 MCG tablet Take by mouth. 12/01/08   [provider]      Allergies    Budesonide-formoterol fumarate, Rocephin [ceftriaxone sodium in dextrose], Avandia [rosiglitazone], Flagyl [metronidazole], Metformin, Tramadol hcl, Valdecoxib, Vancomycin, Amoxicillin, Atorvastatin, Avelox [moxifloxacin hcl in nacl], Baclofen, Cefdinir, Ceftin [cefuroxime axetil], Celebrex [celecoxib], Ciprofloxacin, Clarithromycin, Clindamycin, Clindamycin/lincomycin, Doxycycline, Erythromycin base, Latex, Levaquin [levofloxacin in d5w], Levofloxacin, Macrobid [nitrofurantoin monohyd macro], Macrodantin [nitrofurantoin macrocrystal], Metformin and related, Mirtazapine, Neurontin [gabapentin], Nitrofurantoin, Propoxyphene, Sulfa antibiotics, Tramadol hcl, Ultram [tramadol], Vioxx [rofecoxib], Voltaren [diclofenac sodium], Ertapenem, Other, Sulfacetamide sodium, and Tape    Review of Systems   Review of Systems  Cardiovascular:  Positive for chest pain.    Physical Exam   Vitals:   01/12/23 2001  BP: 121/88  Pulse: 100  Resp: 20  Temp: 98 F (36.7 C)  SpO2: 96%    CONSTITUTIONAL:  well-appearing, NAD NEURO:  Alert and oriented x 3, CN 3-12 grossly intact EYES:  eyes  equal and reactive ENT/NECK:  Supple, no stridor  CARDIO:  regular rate and rhythm, appears well-perfused  PULM:  No respiratory distress, CTAB GI/GU:  non-distended, soft MSK/SPINE:  No gross deformities, no edema, moves all extremities  SKIN:  no rash, atraumatic  *Additional and/or pertinent findings included in MDM below   ED Results / Procedures / Treatments   Labs (all labs ordered are listed, but only abnormal results are displayed) Labs Reviewed  BASIC METABOLIC PANEL - Abnormal; Notable for the following components:      Result Value   Glucose, Bld 115 (*)    All other components within normal limits  CBC  TROPONIN I (HIGH SENSITIVITY)  TROPONIN I (HIGH SENSITIVITY)    EKG EKG Interpretation  Date/Time:  Monday January 12 2023 20:00:05 EDT Ventricular Rate:  93 PR Interval:  175 QRS Duration: 90 QT Interval:  337 QTC Calculation: 420 R Axis:   46 Text Interpretation: Sinus rhythm Low voltage, precordial leads RSR' in V1 or V2, right VCD or RVH Borderline T abnormalities, anterior leads Confirmed by Glyn Ade [phone]) on 01/12/2023 10:45:19 PM  Radiology DG Chest 2 View  Result Date: 01/12/2023 CLINICAL DATA:  Chest pain. EXAM: CHEST - 2 VIEW COMPARISON:  11/17/2021. FINDINGS: Clear lungs. Normal heart size and mediastinal contours. No pleural effusion or pneumothorax. Visualized bones and upper abdomen are unremarkable. IMPRESSION: No evidence of acute cardiopulmonary disease. Electronically Signed   By: Orvan Falconer M.D.   On: 01/12/2023 20:36    Procedures Procedures  {Document cardiac monitor, telemetry assessment procedure when appropriate:1}  Medications Ordered in ED Medications  HYDROcodone-acetaminophen (NORCO/VICODIN) 5-325 MG per tablet 2 tablet (has no administration in time range)    ED Course/ Medical Decision Making/ A&P Clinical Course as of 01/12/23 2246  Mon Jan 12, 2023  2245 Platelets: 36 [JR]  4920 62 year old female  presenting with a chief complaint of left-sided chest pain.  Has been present since yesterday at 11:30 AM.  Extensive medical history on chronic pain medications at baseline. Troponin is negative EKG is negative, more likely to be from her chronic underlying pain condition unlikely to be acute pathology.  Planning for outpatient follow-up with primary care provider. [CC]    Clinical Course User Index [CC] Glyn Ade, MD [JR] Gareth Eagle, PA-C   {   Click here for ABCD2, HEART and other calculatorsREFRESH Note before signing :1}                          Medical Decision Making Amount and/or Complexity of Data Reviewed Labs: ordered. Radiology: ordered.   Initial Impression and Ddx *** Patient PMH that increases complexity of ED encounter:  ***  Interpretation of Diagnostics I independent reviewed and interpreted the labs as followed: ***  - I independently visualized the following imaging with scope of interpretation limited to determining acute life threatening conditions related to emergency care: ***, which revealed ***  Patient Reassessment and Ultimate Disposition/Management ***  Patient management required discussion with the following services or consulting groups:  {BEROCONSULT:26841}  Complexity of Problems Addressed {BEROCOPA:26833}  Additional Data Reviewed and Analyzed Further history obtained from: {BERODATA:26834}  Patient Encounter Risk Assessment {BERORISK:26838}   {Document critical care time when appropriate:1} {Document review of labs and clinical decision tools ie heart score, Chads2Vasc2 etc:1}  {Document your independent review of radiology images, and any outside records:1} {Document your discussion with family members, caretakers, and with consultants:1} {Document social determinants of health affecting pt's care:1} {Document your decision making why or why not admission, treatments were needed:1} Final Clinical Impression(s) / ED  Diagnoses Final diagnoses:  None    Rx / DC Orders ED Discharge Orders     None

## 2023-01-13 NOTE — ED Provider Notes (Signed)
[place] [facility] AT [facility] Provider Note   CSN: [id number] Arrival date & time: 01/12/23  1950     History {Add pertinent medical, surgical, social history, OB history to HPI:1} Chief Complaint  Patient presents with   Chest Pain   HPI Caidance Sybert is a 62 y.o. female with history of sarcoidosis, COPD, mitral valve prolapse and fibromyalgia and diabetes presenting for chest pain.  Started acutely at 11:30 AM yesterday.  Started centrally and was a sharp stabbing pain that radiated across her left side of her chest and down her arm.  "Felt like a lightening bolt". Has been very painful and constant.  Denies associated shortness of breath.  Denies cough congestion.   Chest Pain      Home Medications Prior to Admission medications   Medication Sig Start Date End Date Taking? Authorizing Provider  atorvastatin (LIPITOR) 20 MG tablet Take 20 mg by mouth daily.    [provider]  azelastine (ASTELIN) 0.1 % nasal spray Take 2 sprays per nostril twice daily as needed for runny nose 06/22/18   Fletcher Anon, MD  benzonatate (TESSALON) 100 MG capsule Take 100 mg by mouth 3 (three) times daily as needed for cough.    [provider]  Cholecalciferol (VITAMIN D-3 PO) Take by mouth.    [provider]  ciclesonide (ALVESCO) 80 MCG/ACT inhaler Inhale 2 puffs into the lungs 2 (two) times daily.    [provider]  diazepam (VALIUM) 10 MG tablet Take 10 mg by mouth 2 (two) times daily as needed. 09/25/21   [provider]  famciclovir (FAMVIR) 250 MG tablet Take 250 mg by mouth 2 (two) times daily. 05/25/20   [provider]  fluconazole (DIFLUCAN) 200 MG tablet Take 400 mg by mouth once. 09/19/21   [provider]  fluticasone (FLONASE) 50 MCG/ACT nasal spray Place 2 sprays into both nostrils daily as needed (for stuffy nose). 06/22/18   Fletcher Anon, MD  fluticasone (FLOVENT HFA) 44 MCG/ACT inhaler  Inhale 2 puffs into the lungs 2 (two) times daily. 05/11/18   Fletcher Anon, MD  hydrOXYzine (VISTARIL) 25 MG capsule Take 25 mg by mouth at bedtime as needed. 04/26/21   [provider]  Icosapent Ethyl (VASCEPA PO) Take by mouth.    [provider]  Lactobacillus Rhamnosus, GG, (CULTURELLE PO) Take by mouth.    [provider]  levalbuterol Pauline Aus HFA) 45 MCG/ACT inhaler Inhale 2 puffs into the lungs every 4 (four) hours as needed for wheezing. 06/22/18   Fletcher Anon, MD  levalbuterol (XOPENEX) 1.25 MG/3ML nebulizer solution Take 1.25 mg by nebulization every 6 (six) hours as needed for wheezing or shortness of breath. 06/22/18   Fletcher Anon, MD  metoprolol tartrate (LOPRESSOR) 100 MG tablet Take tablet (100mg ) TWO hours prior to your cardiac CT scan. 03/20/22   Sande Rives, MD  montelukast (SINGULAIR) 10 MG tablet Take 1 tablet (10 mg total) by mouth at bedtime. 06/06/20   Hetty Blend, FNP  naloxone [facility]) nasal spray 4 mg/0.1 mL Place into the nose. 05/31/19   [provider]  nebivolol (BYSTOLIC) 5 MG tablet Take 5 mg by mouth daily.    [provider]  omeprazole (PRILOSEC) 40 MG capsule Take 40 mg by mouth daily.    [provider]  oxyCODONE-acetaminophen (PERCOCET) 7.5-325 MG tablet Take 1 tablet by mouth 3 (three) times daily as needed. 09/20/21   [provider]  pantoprazole (PROTONIX) 20 MG tablet Take 1 tablet (20 mg total) by mouth daily for 14 days. 09/26/21 10/10/21  Curatolo, Adam, DO  phenazopyridine (PYRIDIUM) 200 MG tablet Take 200 mg by mouth 3 (three) times daily as needed for pain.    [provider]  polyvinyl alcohol (LIQUIFILM TEARS) 1.4 % ophthalmic solution Administer 1 drop to both eyes Three (3) times a day as needed for dry eyes.    [provider]  potassium chloride SA (K-DUR,KLOR-CON) 20 MEQ tablet Take 20 mEq by mouth daily. 01/17/14   [provider]   promethazine (PHENERGAN) 25 MG tablet Take 1 tablet (25 mg total) by mouth every 8 (eight) hours as needed for up to 14 days for nausea or vomiting. 09/26/21 10/10/21  Virgina Norfolk, DO  Spacer/Aero-Holding Chambers (AEROCHAMBER W/FLOWSIGNAL) inhaler Use as directed with inhaler 05/11/18   [provider]  sucralfate (CARAFATE) 1 g tablet Take 1 tablet (1 g total) by mouth 4 (four) times daily -  with meals and at bedtime for 14 days. 09/26/21 10/10/21  Curatolo, Adam, DO  Tiotropium Bromide Monohydrate (SPIRIVA RESPIMAT) 1.25 MCG/ACT AERS Take 2 puffs once daily for cough or wheeze. 06/13/20   Bobbitt, Heywood Iles, MD  triamcinolone cream (KENALOG) 0.1 % Apply topically. 07/20/15   [provider]  vitamin B-12 (CYANOCOBALAMIN) 100 MCG tablet Take by mouth. 12/01/08   [provider]      Allergies    Budesonide-formoterol fumarate, Rocephin [ceftriaxone sodium in dextrose], Avandia [rosiglitazone], Flagyl [metronidazole], Metformin, Tramadol hcl, Valdecoxib, Vancomycin, Amoxicillin, Atorvastatin, Avelox [moxifloxacin hcl in nacl], Baclofen, Cefdinir, Ceftin [cefuroxime axetil], Celebrex [celecoxib], Ciprofloxacin, Clarithromycin, Clindamycin, Clindamycin/lincomycin, Doxycycline, Erythromycin base, Latex, Levaquin [levofloxacin in d5w], Levofloxacin, Macrobid [nitrofurantoin monohyd macro], Macrodantin [nitrofurantoin macrocrystal], Metformin and related, Mirtazapine, Neurontin [gabapentin], Nitrofurantoin, Propoxyphene, Sulfa antibiotics, Tramadol hcl, Ultram [tramadol], Vioxx [rofecoxib], Voltaren [diclofenac sodium], Ertapenem, Other, Sulfacetamide sodium, and Tape    Review of Systems   Review of Systems  Cardiovascular:  Positive for chest pain.    Physical Exam   Vitals:   01/12/23 2230 01/12/23 2300  BP: 119/84 118/83  Pulse: 81 75  Resp: 18   Temp:    SpO2: 98% 97%    CONSTITUTIONAL:  well-appearing, NAD NEURO:  Alert and oriented x 3, CN 3-12 grossly  intact EYES:  eyes equal and reactive ENT/NECK:  Supple, no stridor  CARDIO:  regular rate and rhythm, appears well-perfused  PULM:  No respiratory distress, CTAB GI/GU:  non-distended, soft MSK/SPINE:  No gross deformities, no edema, moves all extremities  SKIN:  no rash, atraumatic  *Additional and/or pertinent findings included in MDM below   ED Results / Procedures / Treatments   Labs (all labs ordered are listed, but only abnormal results are displayed) Labs Reviewed  BASIC METABOLIC PANEL - Abnormal; Notable for the following components:      Result Value   Glucose, Bld 115 (*)    All other components within normal limits  CBC  TROPONIN I (HIGH SENSITIVITY)    EKG EKG Interpretation  Date/Time:  Monday January 12 2023 20:00:05 EDT Ventricular Rate:  93 PR Interval:  175 QRS Duration: 90 QT Interval:  337 QTC Calculation: 420 R Axis:   46 Text Interpretation: Sinus rhythm Low voltage, precordial leads RSR' in V1 or V2, right VCD or RVH Borderline T abnormalities, anterior leads Confirmed by Glyn Ade [phone]) on 01/12/2023 10:45:19 PM  Radiology DG Chest 2 View  Result Date: 01/12/2023 CLINICAL DATA:  Chest pain. EXAM: CHEST - 2 VIEW COMPARISON:  11/17/2021. FINDINGS: Clear lungs. Normal heart size and mediastinal contours. No pleural effusion or pneumothorax. Visualized bones and upper abdomen are unremarkable. IMPRESSION: No evidence of acute cardiopulmonary disease. Electronically Signed   By: Orvan Falconer M.D.   On: 01/12/2023 20:36    Procedures Procedures  {Document cardiac monitor, telemetry assessment procedure when appropriate:1}  Medications Ordered in ED Medications  HYDROcodone-acetaminophen (NORCO/VICODIN) 5-325 MG per tablet 2 tablet (2 tablets Oral Given 01/12/23 2306)    ED Course/ Medical Decision Making/ A&P Clinical Course as of 01/13/23 0000  Mon Jan 12, 2023  2245 Platelets: 78 [JR]  4467 62 year old female presenting with a chief  complaint of left-sided chest pain.  Has been present since yesterday at 11:30 AM.  Extensive medical history on chronic pain medications at baseline. Troponin is negative EKG is negative, more likely to be from her chronic underlying pain condition unlikely to be acute pathology.  Planning for outpatient follow-up with primary care provider. [CC]    Clinical Course User Index [CC] Glyn Ade, MD [JR] Gareth Eagle, PA-C   {   Click here for ABCD2, HEART and other calculatorsREFRESH Note before signing :1}                          Medical Decision Making Amount and/or Complexity of Data Reviewed Labs: ordered. Radiology: ordered.   Initial Impression and Ddx *** Patient PMH that increases complexity of ED encounter:  ***  Interpretation of Diagnostics I independent reviewed and interpreted the labs as followed: ***  - I independently visualized the following imaging with scope of interpretation limited to determining acute life threatening conditions related to emergency care: ***, which revealed ***  Patient Reassessment and Ultimate Disposition/Management ***  Patient management required discussion with the following services or consulting groups:  {BEROCONSULT:26841}  Complexity of Problems Addressed {BEROCOPA:26833}  Additional Data Reviewed and Analyzed Further history obtained from: {BERODATA:26834}  Patient Encounter Risk Assessment {BERORISK:26838}   {Document critical care time when appropriate:1} {Document review of labs and clinical decision tools ie heart score, Chads2Vasc2 etc:1}  {Document your independent review of radiology images, and any outside records:1} {Document your discussion with family members, caretakers, and with consultants:1} {Document social determinants of health affecting pt's care:1} {Document your decision making why or why not admission, treatments were needed:1} Final Clinical Impression(s) / ED Diagnoses Final diagnoses:   None    Rx / DC Orders ED Discharge Orders     None

## 2023-01-13 NOTE — Discharge Instructions (Addendum)
Evaluation for your chest pain was overall reassuring.  Likely that it was related to her heart and lungs.  EKG, labs and x-ray were all reassuring.  Advise you follow-up with your PCP in few days.  If you have worsening chest pain, shortness of breath or calf tenderness or any other concerning symptom please return emergency department further evaluation.

## 2023-05-05 ENCOUNTER — Other Ambulatory Visit: Payer: Self-pay

## 2023-05-05 ENCOUNTER — Emergency Department (HOSPITAL_BASED_OUTPATIENT_CLINIC_OR_DEPARTMENT_OTHER)
Admission: EM | Admit: 2023-05-05 | Discharge: 2023-05-05 | Disposition: A | Discharge status: Home / Self Care | Payer: Medicare HMO | Attending: Emergency Medicine | Admitting: Emergency Medicine

## 2023-05-05 ENCOUNTER — Encounter (HOSPITAL_BASED_OUTPATIENT_CLINIC_OR_DEPARTMENT_OTHER): Payer: Self-pay | Admitting: Urology

## 2023-05-05 ENCOUNTER — Emergency Department (HOSPITAL_BASED_OUTPATIENT_CLINIC_OR_DEPARTMENT_OTHER): Payer: Medicare HMO

## 2023-05-05 DIAGNOSIS — R0789 Other chest pain: Secondary | ICD-10-CM | POA: Insufficient documentation

## 2023-05-05 DIAGNOSIS — Z91040 Latex allergy status: Secondary | ICD-10-CM | POA: Diagnosis not present

## 2023-05-05 DIAGNOSIS — R110 Nausea: Secondary | ICD-10-CM | POA: Insufficient documentation

## 2023-05-05 DIAGNOSIS — R0602 Shortness of breath: Secondary | ICD-10-CM | POA: Diagnosis not present

## 2023-05-05 LAB — CBC
HCT: 37.2 % (ref 36.0–46.0)
Hemoglobin: 12.4 g/dL (ref 12.0–15.0)
MCH: 32.7 pg (ref 26.0–34.0)
MCHC: 33.3 g/dL (ref 30.0–36.0)
MCV: 98.2 fL (ref 80.0–100.0)
Platelets: 205 10*3/uL (ref 150–400)
RBC: 3.79 MIL/uL — ABNORMAL LOW (ref 3.87–5.11)
RDW: 12.2 % (ref 11.5–15.5)
WBC: 6.7 10*3/uL (ref 4.0–10.5)
nRBC: 0 % (ref 0.0–0.2)

## 2023-05-05 LAB — BASIC METABOLIC PANEL
Anion gap: 10 (ref 5–15)
BUN: 8 mg/dL (ref 6–20)
CO2: 26 mmol/L (ref 22–32)
Calcium: 9.2 mg/dL (ref 8.9–10.3)
Chloride: 105 mmol/L (ref 98–111)
Creatinine, Ser: 1.03 mg/dL — ABNORMAL HIGH (ref 0.44–1.00)
GFR, Estimated: >60 mL/min (ref >60)
Glucose, Bld: 80 mg/dL (ref 70–99)
Potassium: 3.5 mmol/L (ref 3.5–5.1)
Sodium: 141 mmol/L (ref 135–145)

## 2023-05-05 LAB — TROPONIN I (HIGH SENSITIVITY): Troponin I (High Sensitivity): 4 ng/L (ref <18)

## 2023-05-05 NOTE — Discharge Instructions (Addendum)
Your tests are all normal and your exam supports a diagnosis of chest wall pain. No evidence of heart abnormality, infection, blood clots or other serious illness. See your doctor for recheck in the next week to insure symptoms are improving.

## 2023-05-05 NOTE — ED Notes (Signed)
Patient transported to X-ray 

## 2023-05-05 NOTE — ED Provider Notes (Signed)
[place] [facility] AT [facility] Provider Note   CSN: [id number] Arrival date & time: 05/05/23  2007     History  Chief Complaint  Patient presents with   Chest Pain    Holly Rogers is a 62 y.o. female.  Patient with complaint of left chest pain x 4-5 days that is described as constant, radiates to back, without aggravating or alleviating factors. She reports SOB once during the last 5 days when she got up to the bathroom, but otherwise, no SOB, no cough or fever. She reports nausea without vomiting. No abdominal pain. She is concerned that the pain is associated with a spider bite she got about 2 weeks ago as she has not felt completely well since then.   The history is provided by the patient. No language interpreter was used.  Chest Pain      Home Medications Prior to Admission medications   Medication Sig Start Date End Date Taking? Authorizing Provider  atorvastatin (LIPITOR) 20 MG tablet Take 20 mg by mouth daily.    [provider]  azelastine (ASTELIN) 0.1 % nasal spray Take 2 sprays per nostril twice daily as needed for runny nose 06/22/18   Fletcher Anon, MD  benzonatate (TESSALON) 100 MG capsule Take 100 mg by mouth 3 (three) times daily as needed for cough.    [provider]  Cholecalciferol (VITAMIN D-3 PO) Take by mouth.    [provider]  ciclesonide (ALVESCO) 80 MCG/ACT inhaler Inhale 2 puffs into the lungs 2 (two) times daily.    [provider]  diazepam (VALIUM) 10 MG tablet Take 10 mg by mouth 2 (two) times daily as needed. 09/25/21   [provider]  famciclovir (FAMVIR) 250 MG tablet Take 250 mg by mouth 2 (two) times daily. 05/25/20   [provider]  fluconazole (DIFLUCAN) 200 MG tablet Take 400 mg by mouth once. 09/19/21   [provider]  fluticasone (FLONASE) 50 MCG/ACT nasal spray Place 2 sprays into both nostrils daily as needed (for stuffy nose). 06/22/18   Fletcher Anon, MD  fluticasone (FLOVENT HFA) 44 MCG/ACT inhaler Inhale 2 puffs into the lungs 2 (two) times daily. 05/11/18   Fletcher Anon, MD  hydrOXYzine (VISTARIL) 25 MG capsule Take 25 mg by mouth at bedtime as needed. 04/26/21   [provider]  Icosapent Ethyl (VASCEPA PO) Take by mouth.    [provider]  Lactobacillus Rhamnosus, GG, (CULTURELLE PO) Take by mouth.    [provider]  levalbuterol Pauline Aus HFA) 45 MCG/ACT inhaler Inhale 2 puffs into the lungs every 4 (four) hours as needed for wheezing. 06/22/18   Fletcher Anon, MD  levalbuterol (XOPENEX) 1.25 MG/3ML nebulizer solution Take 1.25 mg by nebulization every 6 (six) hours as needed for wheezing or shortness of breath. 06/22/18   Fletcher Anon, MD  metoprolol tartrate (LOPRESSOR) 100 MG tablet Take tablet (100mg ) TWO hours prior to your cardiac CT scan. 03/20/22   Sande Rives, MD  montelukast (SINGULAIR) 10 MG tablet Take 1 tablet (10 mg total) by mouth at bedtime. 06/06/20   Hetty Blend, FNP  naloxone [facility]) nasal spray 4 mg/0.1 mL Place into the nose. 05/31/19   [provider]  nebivolol (BYSTOLIC) 5 MG tablet Take 5 mg by mouth daily.    [provider]  omeprazole (PRILOSEC) 40 MG capsule Take 40 mg by mouth daily.    [provider]  oxyCODONE-acetaminophen (  PERCOCET) 7.5-325 MG tablet Take 1 tablet by mouth 3 (three) times daily as needed. 09/20/21   [provider]  pantoprazole (PROTONIX) 20 MG tablet Take 1 tablet (20 mg total) by mouth daily for 14 days. 09/26/21 10/10/21  Curatolo, Adam, DO  phenazopyridine (PYRIDIUM) 200 MG tablet Take 200 mg by mouth 3 (three) times daily as needed for pain.    [provider]  polyvinyl alcohol (LIQUIFILM TEARS) 1.4 % ophthalmic solution Administer 1 drop to both eyes Three (3) times a day as needed for dry eyes.    [provider]  potassium chloride SA (K-DUR,KLOR-CON) 20 MEQ tablet Take 20 mEq by  mouth daily. 01/17/14   [provider]  promethazine (PHENERGAN) 25 MG tablet Take 1 tablet (25 mg total) by mouth every 8 (eight) hours as needed for up to 14 days for nausea or vomiting. 09/26/21 10/10/21  Virgina Norfolk, DO  Spacer/Aero-Holding Chambers (AEROCHAMBER W/FLOWSIGNAL) inhaler Use as directed with inhaler 05/11/18   [provider]  sucralfate (CARAFATE) 1 g tablet Take 1 tablet (1 g total) by mouth 4 (four) times daily -  with meals and at bedtime for 14 days. 09/26/21 10/10/21  Curatolo, Adam, DO  Tiotropium Bromide Monohydrate (SPIRIVA RESPIMAT) 1.25 MCG/ACT AERS Take 2 puffs once daily for cough or wheeze. 06/13/20   Bobbitt, Heywood Iles, MD  triamcinolone cream (KENALOG) 0.1 % Apply topically. 07/20/15   [provider]  vitamin B-12 (CYANOCOBALAMIN) 100 MCG tablet Take by mouth. 12/01/08   [provider]      Allergies    Budesonide-formoterol fumarate, Rocephin [ceftriaxone sodium in dextrose], Avandia [rosiglitazone], Flagyl [metronidazole], Metformin, Tramadol hcl, Valdecoxib, Vancomycin, Amoxicillin, Atorvastatin, Avelox [moxifloxacin hcl in nacl], Baclofen, Cefdinir, Ceftin [cefuroxime axetil], Celebrex [celecoxib], Ciprofloxacin, Clarithromycin, Clindamycin, Clindamycin/lincomycin, Doxycycline, Erythromycin base, Latex, Levaquin [levofloxacin in d5w], Levofloxacin, Macrobid [nitrofurantoin monohyd macro], Macrodantin [nitrofurantoin macrocrystal], Metformin and related, Mirtazapine, Neurontin [gabapentin], Nitrofurantoin, Propoxyphene, Sulfa antibiotics, Tramadol hcl, Ultram [tramadol], Vioxx [rofecoxib], Voltaren [diclofenac sodium], Ertapenem, Other, Sulfacetamide sodium, and Tape    Review of Systems   Review of Systems  Cardiovascular:  Positive for chest pain.    Physical Exam Updated Vital Signs BP (!) 158/95 (BP Location: Left Arm)   Pulse 97   Temp 98 F (36.7 C)   Resp 20   Ht 5\' 3"  (1.6 m)   Wt 84.8 kg   SpO2 99%   BMI 33.12  kg/m  Physical Exam Vitals and nursing note reviewed.  Constitutional:      General: She is not in acute distress. Cardiovascular:     Rate and Rhythm: Normal rate and regular rhythm.  Pulmonary:     Effort: Pulmonary effort is normal. No respiratory distress.     Breath sounds: Normal breath sounds.  Chest:     Chest wall: Tenderness (Tender over left chest and left scapula that reproduces pain of complaint.) present.  Abdominal:     General: There is no abdominal bruit.  Musculoskeletal:     Right lower leg: No edema.     Left lower leg: No edema.  Neurological:     Mental Status: She is alert.     ED Results / Procedures / Treatments   Labs (all labs ordered are listed, but only abnormal results are displayed) Labs Reviewed  BASIC METABOLIC PANEL - Abnormal; Notable for the following components:      Result Value   Creatinine, Ser 1.03 (*)    All other components within normal limits  CBC - Abnormal; Notable for the following components:   RBC 3.79 (*)    All other components within normal limits  TROPONIN I (HIGH SENSITIVITY)  TROPONIN I (HIGH SENSITIVITY)    EKG EKG Interpretation Date/Time:  Tuesday May 05 2023 20:17:01 EDT Ventricular Rate:  94 PR Interval:  177 QRS Duration:  88 QT Interval:  337 QTC Calculation: 422 R Axis:   9  Text Interpretation: Sinus rhythm Inferior infarct, old Baseline wander in lead(s) V1 Confirmed by Alona Bene [phone]) on 05/05/2023 8:27:46 PM  Radiology DG Chest 2 View  Result Date: 05/05/2023 CLINICAL DATA:  Chest pain after getting bit by a spider 2 weeks ago EXAM: CHEST - 2 VIEW COMPARISON:  01/12/2023 FINDINGS: Stable cardiomediastinal silhouette. Aortic atherosclerotic calcification. No focal consolidation, pleural effusion, or pneumothorax. No displaced rib fractures. Moderate hiatal hernia with air-fluid level. IMPRESSION: No acute cardiopulmonary disease. Moderate hiatal hernia. Electronically Signed   By: Minerva Fester M.D.   On: 05/05/2023 21:05    Procedures Procedures    Medications Ordered in ED Medications - No data to display  ED Course/ Medical Decision Making/ A&P                                 Medical Decision Making Patient with chest pain as described in HPI, atypical for cardiac chest pain. Neg ECG, neg troponin. Pain reproducible to palpation. CXR clear - no infection. Supportive care recommended. Multiple medication allergies (40). Recommend close follow up with PCP.   Amount and/or Complexity of Data Reviewed Labs: ordered. Radiology: ordered.           Final Clinical Impression(s) / ED Diagnoses Final diagnoses:  Chest wall pain    Rx / DC Orders ED Discharge Orders     None         Danne Harbor 05/05/23 2202    Maia Plan, MD 05/05/23 2358

## 2023-05-05 NOTE — ED Triage Notes (Signed)
Pt states left sided chest pain for over 1 week after getting " bit by black widow" a couple weeks ago on right heel  Pain with moving arm, states pain radiating to back and neck  Denies any SOB

## 2024-02-22 ENCOUNTER — Ambulatory Visit: Admitting: Internal Medicine
